# Patient Record
Sex: Female | Born: 1935 | Race: White | Hispanic: No | Marital: Married | Smoking: Never smoker
Health system: Southern US, Community
[De-identification: ages and names within clinical notes are randomized; demographics above are authoritative.]

## PROBLEM LIST (undated history)

## (undated) DIAGNOSIS — M1990 Unspecified osteoarthritis, unspecified site: Secondary | ICD-10-CM

## (undated) DIAGNOSIS — J45909 Unspecified asthma, uncomplicated: Secondary | ICD-10-CM

## (undated) DIAGNOSIS — D649 Anemia, unspecified: Secondary | ICD-10-CM

## (undated) DIAGNOSIS — M810 Age-related osteoporosis without current pathological fracture: Secondary | ICD-10-CM

## (undated) DIAGNOSIS — K219 Gastro-esophageal reflux disease without esophagitis: Secondary | ICD-10-CM

## (undated) HISTORY — DX: Unspecified osteoarthritis, unspecified site: M19.90

## (undated) HISTORY — PX: CHOLECYSTECTOMY: SHX55

## (undated) HISTORY — DX: Gastro-esophageal reflux disease without esophagitis: K21.9

## (undated) HISTORY — DX: Age-related osteoporosis without current pathological fracture: M81.0

## (undated) HISTORY — DX: Anemia, unspecified: D64.9

## (undated) HISTORY — PX: CATARACT EXTRACTION: SUR2

---

## 1962-12-04 HISTORY — PX: ABDOMINAL HYSTERECTOMY: SHX81

## 1993-12-04 HISTORY — PX: SHOULDER SURGERY: SHX246

## 1996-12-04 HISTORY — PX: BREAST BIOPSY: SHX20

## 2002-12-04 HISTORY — PX: KNEE SURGERY: SHX244

## 2006-01-24 ENCOUNTER — Ambulatory Visit: Payer: Self-pay | Admitting: Internal Medicine

## 2006-07-30 ENCOUNTER — Ambulatory Visit: Payer: Self-pay | Admitting: Ophthalmology

## 2007-04-25 ENCOUNTER — Ambulatory Visit: Payer: Self-pay | Admitting: Gastroenterology

## 2008-03-25 ENCOUNTER — Ambulatory Visit: Payer: Self-pay | Admitting: Internal Medicine

## 2008-05-04 ENCOUNTER — Ambulatory Visit: Payer: Self-pay | Admitting: Internal Medicine

## 2008-05-14 ENCOUNTER — Ambulatory Visit: Payer: Self-pay | Admitting: Internal Medicine

## 2008-06-03 ENCOUNTER — Ambulatory Visit: Payer: Self-pay | Admitting: Internal Medicine

## 2008-07-04 ENCOUNTER — Ambulatory Visit: Payer: Self-pay | Admitting: Internal Medicine

## 2008-08-20 ENCOUNTER — Ambulatory Visit: Payer: Self-pay | Admitting: Internal Medicine

## 2008-09-03 ENCOUNTER — Ambulatory Visit: Payer: Self-pay | Admitting: Internal Medicine

## 2008-10-04 ENCOUNTER — Ambulatory Visit: Payer: Self-pay | Admitting: Internal Medicine

## 2008-10-14 ENCOUNTER — Ambulatory Visit: Payer: Self-pay | Admitting: Internal Medicine

## 2008-11-03 ENCOUNTER — Ambulatory Visit: Payer: Self-pay | Admitting: Internal Medicine

## 2008-12-10 ENCOUNTER — Ambulatory Visit: Payer: Self-pay | Admitting: Internal Medicine

## 2009-01-04 ENCOUNTER — Ambulatory Visit: Payer: Self-pay | Admitting: Internal Medicine

## 2009-02-01 ENCOUNTER — Ambulatory Visit: Payer: Self-pay | Admitting: Internal Medicine

## 2009-02-04 ENCOUNTER — Ambulatory Visit: Payer: Self-pay | Admitting: Internal Medicine

## 2009-03-04 ENCOUNTER — Ambulatory Visit: Payer: Self-pay | Admitting: Internal Medicine

## 2009-03-25 ENCOUNTER — Ambulatory Visit: Payer: Self-pay | Admitting: Internal Medicine

## 2009-03-29 ENCOUNTER — Ambulatory Visit: Payer: Self-pay | Admitting: Internal Medicine

## 2009-04-03 ENCOUNTER — Ambulatory Visit: Payer: Self-pay | Admitting: Internal Medicine

## 2009-05-06 ENCOUNTER — Ambulatory Visit: Payer: Self-pay | Admitting: Gastroenterology

## 2009-05-27 ENCOUNTER — Ambulatory Visit: Payer: Self-pay | Admitting: Internal Medicine

## 2009-06-03 ENCOUNTER — Ambulatory Visit: Payer: Self-pay | Admitting: Internal Medicine

## 2009-07-04 ENCOUNTER — Ambulatory Visit: Payer: Self-pay | Admitting: Internal Medicine

## 2009-08-04 ENCOUNTER — Ambulatory Visit: Payer: Self-pay | Admitting: Internal Medicine

## 2009-09-03 ENCOUNTER — Ambulatory Visit: Payer: Self-pay | Admitting: Internal Medicine

## 2009-10-04 ENCOUNTER — Ambulatory Visit: Payer: Self-pay | Admitting: Internal Medicine

## 2009-11-03 ENCOUNTER — Ambulatory Visit: Payer: Self-pay | Admitting: Internal Medicine

## 2009-12-04 ENCOUNTER — Ambulatory Visit: Payer: Self-pay | Admitting: Internal Medicine

## 2010-01-04 ENCOUNTER — Ambulatory Visit: Payer: Self-pay | Admitting: Internal Medicine

## 2010-02-01 ENCOUNTER — Ambulatory Visit: Payer: Self-pay | Admitting: Internal Medicine

## 2010-03-04 ENCOUNTER — Ambulatory Visit: Payer: Self-pay | Admitting: Internal Medicine

## 2010-03-31 ENCOUNTER — Ambulatory Visit: Payer: Self-pay | Admitting: Internal Medicine

## 2010-05-26 ENCOUNTER — Ambulatory Visit: Payer: Self-pay | Admitting: Internal Medicine

## 2010-06-03 ENCOUNTER — Ambulatory Visit: Payer: Self-pay | Admitting: Internal Medicine

## 2010-06-21 ENCOUNTER — Ambulatory Visit: Payer: Self-pay | Admitting: Internal Medicine

## 2010-08-04 ENCOUNTER — Ambulatory Visit: Payer: Self-pay | Admitting: Internal Medicine

## 2010-08-18 ENCOUNTER — Ambulatory Visit: Payer: Self-pay | Admitting: Internal Medicine

## 2010-09-03 ENCOUNTER — Ambulatory Visit: Payer: Self-pay | Admitting: Internal Medicine

## 2010-10-04 ENCOUNTER — Ambulatory Visit: Payer: Self-pay | Admitting: Internal Medicine

## 2010-11-03 ENCOUNTER — Ambulatory Visit: Payer: Self-pay | Admitting: Internal Medicine

## 2010-12-04 ENCOUNTER — Ambulatory Visit: Payer: Self-pay | Admitting: Internal Medicine

## 2010-12-04 HISTORY — PX: KNEE SURGERY: SHX244

## 2011-01-04 ENCOUNTER — Ambulatory Visit: Payer: Self-pay | Admitting: Internal Medicine

## 2011-02-02 ENCOUNTER — Ambulatory Visit: Payer: Self-pay | Admitting: Internal Medicine

## 2011-03-05 ENCOUNTER — Ambulatory Visit: Payer: Self-pay | Admitting: Internal Medicine

## 2011-04-04 ENCOUNTER — Ambulatory Visit: Payer: Self-pay | Admitting: Internal Medicine

## 2011-04-18 ENCOUNTER — Ambulatory Visit: Payer: Self-pay | Admitting: Internal Medicine

## 2011-05-05 ENCOUNTER — Ambulatory Visit: Payer: Self-pay | Admitting: Internal Medicine

## 2011-06-04 ENCOUNTER — Ambulatory Visit: Payer: Self-pay | Admitting: Internal Medicine

## 2011-07-27 ENCOUNTER — Ambulatory Visit: Payer: Self-pay | Admitting: Internal Medicine

## 2011-08-05 ENCOUNTER — Ambulatory Visit: Payer: Self-pay | Admitting: Internal Medicine

## 2011-08-28 ENCOUNTER — Ambulatory Visit: Payer: Self-pay | Admitting: General Practice

## 2011-09-04 ENCOUNTER — Ambulatory Visit: Payer: Self-pay | Admitting: Internal Medicine

## 2011-09-11 ENCOUNTER — Inpatient Hospital Stay: Payer: Self-pay | Admitting: General Practice

## 2011-09-15 ENCOUNTER — Encounter: Payer: Self-pay | Admitting: Internal Medicine

## 2011-10-05 ENCOUNTER — Encounter: Payer: Self-pay | Admitting: Internal Medicine

## 2011-11-04 ENCOUNTER — Encounter: Payer: Self-pay | Admitting: Internal Medicine

## 2011-11-16 ENCOUNTER — Ambulatory Visit: Payer: Self-pay | Admitting: Internal Medicine

## 2011-12-05 ENCOUNTER — Ambulatory Visit: Payer: Self-pay | Admitting: Internal Medicine

## 2011-12-05 ENCOUNTER — Encounter: Payer: Self-pay | Admitting: Internal Medicine

## 2012-01-05 ENCOUNTER — Ambulatory Visit: Payer: Self-pay | Admitting: Internal Medicine

## 2012-01-05 ENCOUNTER — Encounter: Payer: Self-pay | Admitting: Internal Medicine

## 2012-01-16 ENCOUNTER — Ambulatory Visit: Payer: Self-pay | Admitting: Ophthalmology

## 2012-01-16 LAB — HEMOGLOBIN: HGB: 13.1 g/dL (ref 12.0–16.0)

## 2012-01-29 ENCOUNTER — Ambulatory Visit: Payer: Self-pay | Admitting: Ophthalmology

## 2012-02-02 ENCOUNTER — Ambulatory Visit: Payer: Self-pay | Admitting: Internal Medicine

## 2012-02-22 LAB — CBC CANCER CENTER
Basophil #: 0 x10 3/mm (ref 0.0–0.1)
Eosinophil #: 0.3 x10 3/mm (ref 0.0–0.7)
Eosinophil %: 8.4 %
HCT: 39.7 % (ref 35.0–47.0)
HGB: 12.6 g/dL (ref 12.0–16.0)
Lymphocyte #: 1.4 x10 3/mm (ref 1.0–3.6)
Lymphocyte %: 35.1 %
MCH: 26.8 pg (ref 26.0–34.0)
MCHC: 31.7 g/dL — ABNORMAL LOW (ref 32.0–36.0)
MCV: 84.4 fL (ref 80–100)
Monocyte #: 0.3 x10 3/mm (ref 0.0–0.7)
Neutrophil #: 1.9 x10 3/mm (ref 1.4–6.5)
Neutrophil %: 47.3 %
RBC: 4.7 10*6/uL (ref 3.80–5.20)
WBC: 3.9 x10 3/mm (ref 3.6–11.0)

## 2012-02-22 LAB — IRON AND TIBC
Iron Bind.Cap.(Total): 302 ug/dL (ref 250–450)
Iron Saturation: 17 %
Iron: 52 ug/dL (ref 50–170)

## 2012-03-04 ENCOUNTER — Ambulatory Visit: Payer: Self-pay | Admitting: Internal Medicine

## 2012-04-18 ENCOUNTER — Ambulatory Visit: Payer: Self-pay | Admitting: Internal Medicine

## 2012-05-04 ENCOUNTER — Ambulatory Visit: Payer: Self-pay | Admitting: Internal Medicine

## 2012-05-06 ENCOUNTER — Ambulatory Visit: Payer: Self-pay | Admitting: Internal Medicine

## 2012-05-20 ENCOUNTER — Ambulatory Visit: Payer: Self-pay | Admitting: Internal Medicine

## 2012-06-13 ENCOUNTER — Ambulatory Visit: Payer: Self-pay | Admitting: Internal Medicine

## 2012-06-13 LAB — FERRITIN: Ferritin (ARMC): 21 ng/mL (ref 8–388)

## 2012-06-13 LAB — CANCER CENTER HEMOGLOBIN: HGB: 11.7 g/dL — ABNORMAL LOW (ref 12.0–16.0)

## 2012-07-04 ENCOUNTER — Ambulatory Visit: Payer: Self-pay | Admitting: Internal Medicine

## 2012-08-08 ENCOUNTER — Ambulatory Visit: Payer: Self-pay | Admitting: Internal Medicine

## 2012-09-03 ENCOUNTER — Ambulatory Visit: Payer: Self-pay | Admitting: Internal Medicine

## 2012-10-03 ENCOUNTER — Ambulatory Visit: Payer: Self-pay | Admitting: Internal Medicine

## 2012-10-03 LAB — IRON AND TIBC
Iron Bind.Cap.(Total): 360 ug/dL (ref 250–450)
Unbound Iron-Bind.Cap.: 324 ug/dL

## 2012-10-03 LAB — CBC CANCER CENTER
Basophil %: 0.9 %
Eosinophil #: 0.3 x10 3/mm (ref 0.0–0.7)
Eosinophil %: 6.6 %
HCT: 34.3 % — ABNORMAL LOW (ref 35.0–47.0)
Lymphocyte #: 1.3 x10 3/mm (ref 1.0–3.6)
MCH: 23.4 pg — ABNORMAL LOW (ref 26.0–34.0)
MCHC: 31.6 g/dL — ABNORMAL LOW (ref 32.0–36.0)
MCV: 74 fL — ABNORMAL LOW (ref 80–100)
Monocyte %: 9.2 %
Neutrophil #: 2.1 x10 3/mm (ref 1.4–6.5)
RBC: 4.63 10*6/uL (ref 3.80–5.20)
RDW: 18.4 % — ABNORMAL HIGH (ref 11.5–14.5)
WBC: 4 x10 3/mm (ref 3.6–11.0)

## 2012-10-03 LAB — FERRITIN: Ferritin (ARMC): 11 ng/mL (ref 8–388)

## 2012-10-04 ENCOUNTER — Ambulatory Visit: Payer: Self-pay | Admitting: Internal Medicine

## 2012-11-03 ENCOUNTER — Ambulatory Visit: Payer: Self-pay | Admitting: Internal Medicine

## 2012-12-04 ENCOUNTER — Ambulatory Visit: Payer: Self-pay | Admitting: Internal Medicine

## 2012-12-26 LAB — IRON AND TIBC
Iron Saturation: 6 %
Iron: 24 ug/dL — ABNORMAL LOW (ref 50–170)
Unbound Iron-Bind.Cap.: 348 ug/dL

## 2012-12-26 LAB — CANCER CENTER HEMOGLOBIN: HGB: 11 g/dL — ABNORMAL LOW (ref 12.0–16.0)

## 2013-01-04 ENCOUNTER — Ambulatory Visit: Payer: Self-pay | Admitting: Internal Medicine

## 2013-02-01 ENCOUNTER — Ambulatory Visit: Payer: Self-pay | Admitting: Internal Medicine

## 2013-03-20 ENCOUNTER — Ambulatory Visit: Payer: Self-pay | Admitting: Internal Medicine

## 2013-03-20 LAB — CBC CANCER CENTER
Basophil #: 0 x10 3/mm (ref 0.0–0.1)
Basophil %: 0.9 %
Eosinophil #: 0.3 x10 3/mm (ref 0.0–0.7)
Eosinophil %: 5.9 %
HCT: 39.5 % (ref 35.0–47.0)
HGB: 12.8 g/dL (ref 12.0–16.0)
Lymphocyte #: 1.7 x10 3/mm (ref 1.0–3.6)
Lymphocyte %: 36.3 %
MCHC: 32.4 g/dL (ref 32.0–36.0)
Neutrophil #: 2.3 x10 3/mm (ref 1.4–6.5)
Neutrophil %: 48.7 %
RDW: 20.1 % — ABNORMAL HIGH (ref 11.5–14.5)

## 2013-03-20 LAB — HEPATIC FUNCTION PANEL A (ARMC)
Albumin: 3.6 g/dL (ref 3.4–5.0)
Alkaline Phosphatase: 110 U/L (ref 50–136)
Bilirubin, Direct: 0.1 mg/dL (ref 0.00–0.20)
Bilirubin,Total: 0.5 mg/dL (ref 0.2–1.0)
SGOT(AST): 17 U/L (ref 15–37)
SGPT (ALT): 21 U/L (ref 12–78)

## 2013-03-20 LAB — IRON AND TIBC: Iron Saturation: 15 %

## 2013-03-20 LAB — CREATININE, SERUM: EGFR (African American): >60

## 2013-04-03 ENCOUNTER — Ambulatory Visit: Payer: Self-pay | Admitting: Internal Medicine

## 2013-04-21 ENCOUNTER — Ambulatory Visit: Payer: Self-pay | Admitting: Internal Medicine

## 2013-06-12 ENCOUNTER — Ambulatory Visit: Payer: Self-pay | Admitting: Internal Medicine

## 2013-06-12 LAB — FERRITIN: Ferritin (ARMC): 24 ng/mL (ref 8–388)

## 2013-07-04 ENCOUNTER — Ambulatory Visit: Payer: Self-pay | Admitting: Internal Medicine

## 2013-09-04 ENCOUNTER — Ambulatory Visit: Payer: Self-pay | Admitting: Internal Medicine

## 2013-09-04 LAB — IRON AND TIBC
Iron Saturation: 20 %
Iron: 66 ug/dL (ref 50–170)
Unbound Iron-Bind.Cap.: 259 ug/dL

## 2013-09-04 LAB — CANCER CENTER HEMOGLOBIN: HGB: 12.7 g/dL (ref 12.0–16.0)

## 2013-10-04 ENCOUNTER — Ambulatory Visit: Payer: Self-pay | Admitting: Internal Medicine

## 2013-11-26 ENCOUNTER — Ambulatory Visit: Payer: Self-pay | Admitting: Internal Medicine

## 2013-11-26 LAB — IRON AND TIBC
Iron: 66 ug/dL (ref 50–170)
Unbound Iron-Bind.Cap.: 250 ug/dL

## 2013-11-26 LAB — FERRITIN: Ferritin (ARMC): 20 ng/mL (ref 8–388)

## 2013-11-26 LAB — CANCER CENTER HEMOGLOBIN: HGB: 12.1 g/dL (ref 12.0–16.0)

## 2013-12-04 ENCOUNTER — Ambulatory Visit: Payer: Self-pay | Admitting: Internal Medicine

## 2014-02-18 ENCOUNTER — Ambulatory Visit: Payer: Self-pay | Admitting: Internal Medicine

## 2014-02-19 LAB — CBC CANCER CENTER
Basophil #: 0 x10 3/mm (ref 0.0–0.1)
Basophil %: 0.6 %
EOS PCT: 4.5 %
Eosinophil #: 0.2 x10 3/mm (ref 0.0–0.7)
HCT: 30.7 % — ABNORMAL LOW (ref 35.0–47.0)
HGB: 9.6 g/dL — AB (ref 12.0–16.0)
Lymphocyte #: 0.9 x10 3/mm — ABNORMAL LOW (ref 1.0–3.6)
Lymphocyte %: 25.4 %
MCH: 24.2 pg — ABNORMAL LOW (ref 26.0–34.0)
MCHC: 31.2 g/dL — AB (ref 32.0–36.0)
MCV: 78 fL — ABNORMAL LOW (ref 80–100)
Monocyte #: 0.3 x10 3/mm (ref 0.2–0.9)
Monocyte %: 9.5 %
NEUTROS ABS: 2.2 x10 3/mm (ref 1.4–6.5)
Neutrophil %: 60 %
PLATELETS: 190 x10 3/mm (ref 150–440)
RBC: 3.95 10*6/uL (ref 3.80–5.20)
RDW: 17.1 % — AB (ref 11.5–14.5)
WBC: 3.7 x10 3/mm (ref 3.6–11.0)

## 2014-02-19 LAB — IRON AND TIBC
IRON BIND. CAP.(TOTAL): 329 ug/dL (ref 250–450)
IRON SATURATION: 7 %
IRON: 23 ug/dL — AB (ref 50–170)
Unbound Iron-Bind.Cap.: 306 ug/dL

## 2014-02-19 LAB — CREATININE, SERUM
CREATININE: 0.98 mg/dL (ref 0.60–1.30)
EGFR (African American): >60
EGFR (Non-African Amer.): 54 — ABNORMAL LOW

## 2014-02-19 LAB — HEPATIC FUNCTION PANEL A (ARMC)
ALK PHOS: 95 U/L
ALT: 22 U/L (ref 12–78)
Albumin: 3.2 g/dL — ABNORMAL LOW (ref 3.4–5.0)
Bilirubin, Direct: 0.2 mg/dL (ref 0.00–0.20)
Bilirubin,Total: 0.4 mg/dL (ref 0.2–1.0)
SGOT(AST): 18 U/L (ref 15–37)
TOTAL PROTEIN: 6.5 g/dL (ref 6.4–8.2)

## 2014-02-19 LAB — FERRITIN: FERRITIN (ARMC): 14 ng/mL (ref 8–388)

## 2014-03-04 ENCOUNTER — Ambulatory Visit: Payer: Self-pay | Admitting: Internal Medicine

## 2014-04-03 ENCOUNTER — Ambulatory Visit: Payer: Self-pay | Admitting: Internal Medicine

## 2014-04-23 ENCOUNTER — Ambulatory Visit: Payer: Self-pay | Admitting: Internal Medicine

## 2014-05-11 ENCOUNTER — Ambulatory Visit: Payer: Self-pay

## 2014-05-25 ENCOUNTER — Ambulatory Visit: Payer: Self-pay | Admitting: Internal Medicine

## 2014-05-25 LAB — CBC CANCER CENTER
Basophil #: 0 x10 3/mm (ref 0.0–0.1)
Basophil %: 0.7 %
EOS ABS: 0.1 x10 3/mm (ref 0.0–0.7)
Eosinophil %: 3.1 %
HCT: 37.9 % (ref 35.0–47.0)
HGB: 12.3 g/dL (ref 12.0–16.0)
Lymphocyte #: 1.2 x10 3/mm (ref 1.0–3.6)
Lymphocyte %: 27.5 %
MCH: 26.1 pg (ref 26.0–34.0)
MCHC: 32.3 g/dL (ref 32.0–36.0)
MCV: 81 fL (ref 80–100)
MONOS PCT: 8.3 %
Monocyte #: 0.4 x10 3/mm (ref 0.2–0.9)
NEUTROS ABS: 2.7 x10 3/mm (ref 1.4–6.5)
Neutrophil %: 60.4 %
PLATELETS: 154 x10 3/mm (ref 150–440)
RBC: 4.7 10*6/uL (ref 3.80–5.20)
RDW: 20.1 % — ABNORMAL HIGH (ref 11.5–14.5)
WBC: 4.4 x10 3/mm (ref 3.6–11.0)

## 2014-05-25 LAB — IRON AND TIBC
IRON BIND. CAP.(TOTAL): 307 ug/dL (ref 250–450)
IRON SATURATION: 19 %
Iron: 59 ug/dL (ref 50–170)
Unbound Iron-Bind.Cap.: 248 ug/dL

## 2014-05-25 LAB — FERRITIN: FERRITIN (ARMC): 52 ng/mL (ref 8–388)

## 2014-06-03 ENCOUNTER — Ambulatory Visit: Payer: Self-pay | Admitting: Internal Medicine

## 2014-08-26 ENCOUNTER — Ambulatory Visit: Payer: Self-pay | Admitting: Internal Medicine

## 2014-08-26 LAB — IRON AND TIBC
IRON SATURATION: 21 %
Iron Bind.Cap.(Total): 317 ug/dL (ref 250–450)
Iron: 67 ug/dL (ref 50–170)
UNBOUND IRON-BIND. CAP.: 250 ug/dL

## 2014-08-26 LAB — CBC CANCER CENTER
BASOS ABS: 0 x10 3/mm (ref 0.0–0.1)
Basophil %: 0.7 %
Eosinophil #: 0.2 x10 3/mm (ref 0.0–0.7)
Eosinophil %: 4 %
HCT: 42.5 % (ref 35.0–47.0)
HGB: 13.4 g/dL (ref 12.0–16.0)
LYMPHS ABS: 1.3 x10 3/mm (ref 1.0–3.6)
LYMPHS PCT: 32.5 %
MCH: 26.7 pg (ref 26.0–34.0)
MCHC: 31.6 g/dL — AB (ref 32.0–36.0)
MCV: 85 fL (ref 80–100)
Monocyte #: 0.3 x10 3/mm (ref 0.2–0.9)
Monocyte %: 8.3 %
Neutrophil #: 2.2 x10 3/mm (ref 1.4–6.5)
Neutrophil %: 54.5 %
PLATELETS: 161 x10 3/mm (ref 150–440)
RBC: 5.03 10*6/uL (ref 3.80–5.20)
RDW: 16.2 % — ABNORMAL HIGH (ref 11.5–14.5)
WBC: 4.1 x10 3/mm (ref 3.6–11.0)

## 2014-08-26 LAB — FERRITIN: FERRITIN (ARMC): 28 ng/mL (ref 8–388)

## 2014-09-03 ENCOUNTER — Ambulatory Visit: Payer: Self-pay | Admitting: Internal Medicine

## 2014-09-24 DIAGNOSIS — R195 Other fecal abnormalities: Secondary | ICD-10-CM | POA: Insufficient documentation

## 2014-09-24 DIAGNOSIS — G8929 Other chronic pain: Secondary | ICD-10-CM | POA: Insufficient documentation

## 2014-10-12 ENCOUNTER — Ambulatory Visit: Payer: Self-pay | Admitting: Unknown Physician Specialty

## 2014-12-16 ENCOUNTER — Ambulatory Visit: Payer: Self-pay | Admitting: Family Medicine

## 2014-12-16 DIAGNOSIS — M1990 Unspecified osteoarthritis, unspecified site: Secondary | ICD-10-CM | POA: Diagnosis not present

## 2014-12-16 DIAGNOSIS — J441 Chronic obstructive pulmonary disease with (acute) exacerbation: Secondary | ICD-10-CM | POA: Diagnosis not present

## 2014-12-24 ENCOUNTER — Ambulatory Visit: Payer: Self-pay | Admitting: Internal Medicine

## 2014-12-24 DIAGNOSIS — Z79899 Other long term (current) drug therapy: Secondary | ICD-10-CM | POA: Diagnosis not present

## 2014-12-24 DIAGNOSIS — D509 Iron deficiency anemia, unspecified: Secondary | ICD-10-CM | POA: Diagnosis not present

## 2014-12-24 LAB — IRON AND TIBC
Iron Bind.Cap.(Total): 326 ug/dL (ref 250–450)
Iron Saturation: 8 %
Iron: 25 ug/dL — ABNORMAL LOW (ref 50–170)
Unbound Iron-Bind.Cap.: 301 ug/dL

## 2014-12-24 LAB — FERRITIN: Ferritin (ARMC): 21 ng/mL (ref 8–388)

## 2014-12-24 LAB — CBC CANCER CENTER
BASOS PCT: 0.4 %
Basophil #: 0 x10 3/mm (ref 0.0–0.1)
Eosinophil #: 0.1 x10 3/mm (ref 0.0–0.7)
Eosinophil %: 1.1 %
HCT: 29.7 % — ABNORMAL LOW (ref 35.0–47.0)
HGB: 9.2 g/dL — AB (ref 12.0–16.0)
Lymphocyte #: 1.8 x10 3/mm (ref 1.0–3.6)
Lymphocyte %: 21 %
MCH: 22.4 pg — ABNORMAL LOW (ref 26.0–34.0)
MCHC: 30.9 g/dL — AB (ref 32.0–36.0)
MCV: 73 fL — ABNORMAL LOW (ref 80–100)
Monocyte #: 0.6 x10 3/mm (ref 0.2–0.9)
Monocyte %: 6.8 %
NEUTROS PCT: 70.7 %
Neutrophil #: 6.2 x10 3/mm (ref 1.4–6.5)
Platelet: 421 x10 3/mm (ref 150–440)
RBC: 4.09 10*6/uL (ref 3.80–5.20)
RDW: 18.9 % — ABNORMAL HIGH (ref 11.5–14.5)
WBC: 8.7 x10 3/mm (ref 3.6–11.0)

## 2014-12-30 DIAGNOSIS — Z79899 Other long term (current) drug therapy: Secondary | ICD-10-CM | POA: Diagnosis not present

## 2014-12-30 DIAGNOSIS — D509 Iron deficiency anemia, unspecified: Secondary | ICD-10-CM | POA: Diagnosis not present

## 2015-01-04 ENCOUNTER — Ambulatory Visit: Payer: Self-pay | Admitting: Internal Medicine

## 2015-01-13 DIAGNOSIS — D509 Iron deficiency anemia, unspecified: Secondary | ICD-10-CM | POA: Diagnosis not present

## 2015-01-15 DIAGNOSIS — R0602 Shortness of breath: Secondary | ICD-10-CM | POA: Diagnosis not present

## 2015-01-15 DIAGNOSIS — R05 Cough: Secondary | ICD-10-CM | POA: Diagnosis not present

## 2015-01-15 DIAGNOSIS — J449 Chronic obstructive pulmonary disease, unspecified: Secondary | ICD-10-CM | POA: Diagnosis not present

## 2015-01-15 DIAGNOSIS — Z8719 Personal history of other diseases of the digestive system: Secondary | ICD-10-CM | POA: Diagnosis not present

## 2015-01-25 DIAGNOSIS — J439 Emphysema, unspecified: Secondary | ICD-10-CM | POA: Diagnosis not present

## 2015-01-25 DIAGNOSIS — J449 Chronic obstructive pulmonary disease, unspecified: Secondary | ICD-10-CM | POA: Diagnosis not present

## 2015-01-25 DIAGNOSIS — E784 Other hyperlipidemia: Secondary | ICD-10-CM | POA: Diagnosis not present

## 2015-01-25 DIAGNOSIS — D500 Iron deficiency anemia secondary to blood loss (chronic): Secondary | ICD-10-CM | POA: Diagnosis not present

## 2015-02-02 ENCOUNTER — Ambulatory Visit
Admit: 2015-02-02 | Disposition: A | Discharge status: Home / Self Care | Payer: Self-pay | Attending: Internal Medicine | Admitting: Internal Medicine

## 2015-03-10 DIAGNOSIS — C44712 Basal cell carcinoma of skin of right lower limb, including hip: Secondary | ICD-10-CM | POA: Diagnosis not present

## 2015-03-17 DIAGNOSIS — C44722 Squamous cell carcinoma of skin of right lower limb, including hip: Secondary | ICD-10-CM | POA: Diagnosis not present

## 2015-03-24 DIAGNOSIS — C44722 Squamous cell carcinoma of skin of right lower limb, including hip: Secondary | ICD-10-CM | POA: Diagnosis not present

## 2015-03-28 NOTE — Op Note (Signed)
PATIENT NAME:  Daisy Holloway, CAPES MR#:  [id number] DATE OF BIRTH:  05/02/1929  DATE OF PROCEDURE:  01/29/2012  PREOPERATIVE DIAGNOSIS:  Cataract, right eye.   POSTOPERATIVE DIAGNOSIS:  Cataract, right eye.  PROCEDURE PERFORMED:  Extracapsular cataract extraction using phacoemulsification with placement of an Alcon [id number], 21.0-diopter posterior chamber lens, serial # [id number].  SURGEON:  Loura Back. Macgregor Aeschliman, MD  ASSISTANT:  None.  ANESTHESIA:  4% lidocaine and 0.75% Marcaine in a 50/50 mixture with 10 units per mL of Hylenex added, given as a peribulbar.  ANESTHESIOLOGIST:  Dr. Marcello Moores   COMPLICATIONS:  None.  ESTIMATED BLOOD LOSS:  Less than 1 mL.   DESCRIPTION OF PROCEDURE:  The patient was brought to the operating room and given a peribulbar block.  The patient was then prepped and draped in the usual fashion.  The vertical rectus muscles were imbricated using 5-0 silk sutures.  These sutures were then clamped to the sterile drapes as bridle sutures.  A limbal peritomy was performed extending two clock hours and hemostasis was obtained with cautery.  A partial thickness scleral groove was made at the surgical limbus and dissected anteriorly in a lamellar dissection using an Alcon crescent knife.  The anterior chamber was entered superonasally with a Superblade and through the lamellar dissection with a 2.6 mm keratome.  DisCoVisc was used to replace the aqueous and a continuous tear capsulorrhexis was carried out.  Hydrodissection and hydrodelineation were carried out with balanced salt and a 27 gauge canula.  The nucleus was rotated to confirm the effectiveness of the hydrodissection.  Phacoemulsification was carried out using a divide-and-conquer technique.  Total ultrasound time was 1 minute and 33.2 seconds with an average power of 19 percent, CDE 29.18.  Irrigation/aspiration was used to remove the residual cortex.  DisCoVisc was used to inflate the capsule and the internal  incision was enlarged to 3 mm with the crescent knife.  The intraocular lens was folded and inserted into the capsular bag using the Acrysert delivery system.  Irrigation/aspiration was used to remove the residual DisCoVisc.  Miostat was injected into the anterior chamber through the paracentesis track to inflate the anterior chamber and induce miosis.  The wound was checked for leaks and none were found. The conjunctiva was closed with cautery and the bridle sutures were removed.  Two drops of 0.3% Vigamox were placed on the eye.   An eye shield was placed on the eye.  The patient was discharged to the recovery room in good condition.  ____________________________ Loura Back Karia Ehresman, MD sad:drc D: 01/29/2012 12:53:34 ET T: 01/29/2012 13:08:34 ET JOB#: [id number]  cc: Remo Lipps A. Blue Ruggerio, MD, <Dictator> Martie Lee MD ELECTRONICALLY SIGNED 01/30/2012 15:09

## 2015-03-29 DIAGNOSIS — C44712 Basal cell carcinoma of skin of right lower limb, including hip: Secondary | ICD-10-CM | POA: Diagnosis not present

## 2015-04-01 ENCOUNTER — Other Ambulatory Visit: Payer: Self-pay | Admitting: *Deleted

## 2015-04-01 DIAGNOSIS — D509 Iron deficiency anemia, unspecified: Secondary | ICD-10-CM

## 2015-04-01 DIAGNOSIS — D500 Iron deficiency anemia secondary to blood loss (chronic): Secondary | ICD-10-CM

## 2015-04-01 HISTORY — DX: Iron deficiency anemia secondary to blood loss (chronic): D50.0

## 2015-04-07 ENCOUNTER — Other Ambulatory Visit: Payer: Self-pay | Admitting: *Deleted

## 2015-04-07 ENCOUNTER — Other Ambulatory Visit: Payer: Self-pay

## 2015-04-07 DIAGNOSIS — D509 Iron deficiency anemia, unspecified: Secondary | ICD-10-CM

## 2015-04-08 ENCOUNTER — Inpatient Hospital Stay: Payer: Medicare Other

## 2015-04-08 ENCOUNTER — Inpatient Hospital Stay: Payer: Medicare Other | Attending: Internal Medicine

## 2015-04-08 VITALS — BP 125/59 | HR 58 | Temp 97.3°F | Resp 20

## 2015-04-08 DIAGNOSIS — D509 Iron deficiency anemia, unspecified: Secondary | ICD-10-CM | POA: Insufficient documentation

## 2015-04-08 DIAGNOSIS — Z79899 Other long term (current) drug therapy: Secondary | ICD-10-CM | POA: Diagnosis not present

## 2015-04-08 LAB — CBC WITH DIFFERENTIAL/PLATELET
BASOS ABS: 0 10*3/uL (ref 0–0.1)
Basophils Relative: 0 %
EOS PCT: 4 %
Eosinophils Absolute: 0.2 10*3/uL (ref 0–0.7)
HCT: 37.3 % (ref 35.0–47.0)
HEMOGLOBIN: 12 g/dL (ref 12.0–16.0)
LYMPHS PCT: 29 %
Lymphs Abs: 1.2 10*3/uL (ref 1.0–3.6)
MCH: 25 pg — ABNORMAL LOW (ref 26.0–34.0)
MCHC: 32.2 g/dL (ref 32.0–36.0)
MCV: 77.7 fL — AB (ref 80.0–100.0)
MONO ABS: 0.4 10*3/uL (ref 0.2–0.9)
Monocytes Relative: 11 %
Neutro Abs: 2.3 10*3/uL (ref 1.4–6.5)
Neutrophils Relative %: 56 %
Platelets: 154 10*3/uL (ref 150–440)
RBC: 4.8 MIL/uL (ref 3.80–5.20)
RDW: 18.3 % — AB (ref 11.5–14.5)
WBC: 4.1 10*3/uL (ref 3.6–11.0)

## 2015-04-08 LAB — IRON AND TIBC
Iron: 49 ug/dL (ref 28–170)
Saturation Ratios: 15 % (ref 10.4–31.8)
TIBC: 336 ug/dL (ref 250–450)
UIBC: 287 ug/dL

## 2015-04-08 LAB — FERRITIN: Ferritin: 21 ng/mL (ref 11–307)

## 2015-04-08 MED ORDER — SODIUM CHLORIDE 0.9 % IV SOLN
Freq: Once | INTRAVENOUS | Status: AC
  Administered 2015-04-08: 10:00:00 via INTRAVENOUS
  Filled 2015-04-08: qty 250

## 2015-04-08 MED ORDER — IRON SUCROSE 20 MG/ML IV SOLN
100.0000 mg | Freq: Once | INTRAVENOUS | Status: AC
  Administered 2015-04-08: 100 mg via INTRAVENOUS
  Filled 2015-04-08: qty 5

## 2015-04-12 DIAGNOSIS — C44719 Basal cell carcinoma of skin of left lower limb, including hip: Secondary | ICD-10-CM | POA: Diagnosis not present

## 2015-04-12 DIAGNOSIS — C44712 Basal cell carcinoma of skin of right lower limb, including hip: Secondary | ICD-10-CM | POA: Diagnosis not present

## 2015-04-26 DIAGNOSIS — D500 Iron deficiency anemia secondary to blood loss (chronic): Secondary | ICD-10-CM | POA: Diagnosis not present

## 2015-04-26 DIAGNOSIS — E784 Other hyperlipidemia: Secondary | ICD-10-CM | POA: Diagnosis not present

## 2015-04-26 DIAGNOSIS — J209 Acute bronchitis, unspecified: Secondary | ICD-10-CM | POA: Diagnosis not present

## 2015-04-26 DIAGNOSIS — J439 Emphysema, unspecified: Secondary | ICD-10-CM | POA: Diagnosis not present

## 2015-05-03 ENCOUNTER — Ambulatory Visit: Admission: EM | Admit: 2015-05-03 | Discharge: 2015-05-03 | Discharge status: Absent without leave | Payer: Self-pay

## 2015-05-03 DIAGNOSIS — J441 Chronic obstructive pulmonary disease with (acute) exacerbation: Secondary | ICD-10-CM | POA: Diagnosis not present

## 2015-05-03 DIAGNOSIS — J189 Pneumonia, unspecified organism: Secondary | ICD-10-CM | POA: Diagnosis not present

## 2015-05-10 DIAGNOSIS — C44712 Basal cell carcinoma of skin of right lower limb, including hip: Secondary | ICD-10-CM | POA: Diagnosis not present

## 2015-05-10 DIAGNOSIS — L570 Actinic keratosis: Secondary | ICD-10-CM | POA: Diagnosis not present

## 2015-05-17 DIAGNOSIS — J449 Chronic obstructive pulmonary disease, unspecified: Secondary | ICD-10-CM | POA: Diagnosis not present

## 2015-05-17 DIAGNOSIS — J45909 Unspecified asthma, uncomplicated: Secondary | ICD-10-CM | POA: Diagnosis not present

## 2015-05-17 DIAGNOSIS — R05 Cough: Secondary | ICD-10-CM | POA: Diagnosis not present

## 2015-05-17 DIAGNOSIS — J851 Abscess of lung with pneumonia: Secondary | ICD-10-CM | POA: Diagnosis not present

## 2015-05-17 DIAGNOSIS — J189 Pneumonia, unspecified organism: Secondary | ICD-10-CM | POA: Diagnosis not present

## 2015-06-15 DIAGNOSIS — R5383 Other fatigue: Secondary | ICD-10-CM | POA: Diagnosis not present

## 2015-06-15 DIAGNOSIS — R05 Cough: Secondary | ICD-10-CM | POA: Diagnosis not present

## 2015-06-15 DIAGNOSIS — J449 Chronic obstructive pulmonary disease, unspecified: Secondary | ICD-10-CM | POA: Diagnosis not present

## 2015-06-15 DIAGNOSIS — G479 Sleep disorder, unspecified: Secondary | ICD-10-CM | POA: Diagnosis not present

## 2015-07-29 ENCOUNTER — Inpatient Hospital Stay: Payer: Medicare Other

## 2015-07-29 ENCOUNTER — Inpatient Hospital Stay: Payer: Medicare Other | Attending: Internal Medicine

## 2015-07-29 ENCOUNTER — Inpatient Hospital Stay (HOSPITAL_BASED_OUTPATIENT_CLINIC_OR_DEPARTMENT_OTHER): Payer: Medicare Other | Admitting: Internal Medicine

## 2015-07-29 VITALS — BP 130/60 | HR 68 | Temp 98.0°F | Resp 18 | Ht 63.98 in | Wt 165.3 lb

## 2015-07-29 DIAGNOSIS — Z8719 Personal history of other diseases of the digestive system: Secondary | ICD-10-CM | POA: Insufficient documentation

## 2015-07-29 DIAGNOSIS — K219 Gastro-esophageal reflux disease without esophagitis: Secondary | ICD-10-CM

## 2015-07-29 DIAGNOSIS — R531 Weakness: Secondary | ICD-10-CM | POA: Diagnosis not present

## 2015-07-29 DIAGNOSIS — D509 Iron deficiency anemia, unspecified: Secondary | ICD-10-CM | POA: Diagnosis not present

## 2015-07-29 DIAGNOSIS — J45909 Unspecified asthma, uncomplicated: Secondary | ICD-10-CM | POA: Insufficient documentation

## 2015-07-29 DIAGNOSIS — R0602 Shortness of breath: Secondary | ICD-10-CM | POA: Diagnosis not present

## 2015-07-29 DIAGNOSIS — M818 Other osteoporosis without current pathological fracture: Secondary | ICD-10-CM

## 2015-07-29 DIAGNOSIS — Z79899 Other long term (current) drug therapy: Secondary | ICD-10-CM | POA: Insufficient documentation

## 2015-07-29 DIAGNOSIS — M129 Arthropathy, unspecified: Secondary | ICD-10-CM

## 2015-07-29 LAB — CBC WITH DIFFERENTIAL/PLATELET
Basophils Absolute: 0 10*3/uL (ref 0–0.1)
Basophils Relative: 1 %
EOS PCT: 4 %
Eosinophils Absolute: 0.2 10*3/uL (ref 0–0.7)
HCT: 26.6 % — ABNORMAL LOW (ref 35.0–47.0)
Hemoglobin: 7.9 g/dL — ABNORMAL LOW (ref 12.0–16.0)
LYMPHS ABS: 1.2 10*3/uL (ref 1.0–3.6)
LYMPHS PCT: 27 %
MCH: 19.2 pg — AB (ref 26.0–34.0)
MCHC: 29.9 g/dL — ABNORMAL LOW (ref 32.0–36.0)
MCV: 64.2 fL — AB (ref 80.0–100.0)
Monocytes Absolute: 0.5 10*3/uL (ref 0.2–0.9)
Monocytes Relative: 11 %
Neutro Abs: 2.5 10*3/uL (ref 1.4–6.5)
Neutrophils Relative %: 57 %
PLATELETS: 205 10*3/uL (ref 150–440)
RBC: 4.14 MIL/uL (ref 3.80–5.20)
RDW: 20.3 % — AB (ref 11.5–14.5)
WBC: 4.4 10*3/uL (ref 3.6–11.0)

## 2015-07-29 LAB — IRON AND TIBC
Iron: 16 ug/dL — ABNORMAL LOW (ref 28–170)
Saturation Ratios: 4 % — ABNORMAL LOW (ref 10.4–31.8)
TIBC: 371 ug/dL (ref 250–450)
UIBC: 355 ug/dL

## 2015-07-29 LAB — FERRITIN: Ferritin: 8 ng/mL — ABNORMAL LOW (ref 11–307)

## 2015-07-29 NOTE — Progress Notes (Signed)
Patient is here for follow-up of IDA and Venofer treatment. Patient states that her energy has been down and she feels like it is time for an iron treatment.

## 2015-07-30 ENCOUNTER — Inpatient Hospital Stay: Payer: Medicare Other

## 2015-07-30 VITALS — BP 131/62 | HR 74 | Temp 98.6°F | Resp 20

## 2015-07-30 DIAGNOSIS — R531 Weakness: Secondary | ICD-10-CM | POA: Diagnosis not present

## 2015-07-30 DIAGNOSIS — Z79899 Other long term (current) drug therapy: Secondary | ICD-10-CM | POA: Diagnosis not present

## 2015-07-30 DIAGNOSIS — K219 Gastro-esophageal reflux disease without esophagitis: Secondary | ICD-10-CM | POA: Diagnosis not present

## 2015-07-30 DIAGNOSIS — Z8719 Personal history of other diseases of the digestive system: Secondary | ICD-10-CM | POA: Diagnosis not present

## 2015-07-30 DIAGNOSIS — M818 Other osteoporosis without current pathological fracture: Secondary | ICD-10-CM | POA: Diagnosis not present

## 2015-07-30 DIAGNOSIS — M129 Arthropathy, unspecified: Secondary | ICD-10-CM | POA: Diagnosis not present

## 2015-07-30 DIAGNOSIS — J45909 Unspecified asthma, uncomplicated: Secondary | ICD-10-CM | POA: Diagnosis not present

## 2015-07-30 DIAGNOSIS — D509 Iron deficiency anemia, unspecified: Secondary | ICD-10-CM

## 2015-07-30 DIAGNOSIS — R0602 Shortness of breath: Secondary | ICD-10-CM | POA: Diagnosis not present

## 2015-07-30 MED ORDER — SODIUM CHLORIDE 0.9 % IV SOLN
INTRAVENOUS | Status: DC
  Administered 2015-07-30: 09:00:00 via INTRAVENOUS
  Filled 2015-07-30: qty 1000

## 2015-07-30 MED ORDER — SODIUM CHLORIDE 0.9 % IV SOLN
500.0000 mg | Freq: Once | INTRAVENOUS | Status: AC
  Administered 2015-07-30: 500 mg via INTRAVENOUS
  Filled 2015-07-30: qty 25

## 2015-08-03 DIAGNOSIS — D509 Iron deficiency anemia, unspecified: Secondary | ICD-10-CM | POA: Diagnosis not present

## 2015-08-06 NOTE — Progress Notes (Signed)
[place]  Telephone:([phone] Fax:(336) [phone]     ID: Daisy Holloway OB: 05/02/1929  MR#: [id number]  NGE#:[id number]  No care team member to display  CHIEF COMPLAINT/DIAGNOSIS:  Iron deficiency anemia unresponsive to oral iron therapy, history of GI blood loss - patient on parenteral iron therapy with Venofer (iron sucrose).     HISTORY OF PRESENT ILLNESS:  Patient returns for continued hematology followup, she was last seen in September 2015. Labs in May 2016 showed hemoglobin doing fairly well at 12 g, iron study was in the normal range. Labs today however shows that she has developed significant anemia, hemoglobin has dropped down to 7.9. Patient states that she has been feeling progressive fatigability and dyspnea on exertion. She denies any angina, palpitations, dizziness, orthopnea or PND. She had EGD in November 2015 which reported gastritis. States that she wants to see Dr. Vira Agar for GI evaluation since she has seen him in the past also. Denies any new bone pains. Fairly physically active. Eating steady, denies unintentional weight loss.  REVIEW OF SYSTEMS:   ROS As in HPI above. In addition, no fever, chills or sweats. No new headaches or focal weakness.  No new sore throat, cough, sputum, hemoptysis or chest pain. No new abdominal pain, constipation, diarrhea, dysuria or hematuria. No new skin rash or bleeding symptoms. No new paresthesias in extremities. No polyuria polydipsia.  PAST MEDICAL HISTORY: Reviewed. Chronic arthritis and osteoporosis History of asthma Iron deficiency anemia Gastroesophageal reflux disease. EGD November 2015 reported gastritis.  PAST SURGICAL HISTORY: Reviewed. Cholecystectomy, hysterectomy in 1964, breast surgery '99 benign, cataract surgery 2007, shoulder surgery 1995, knee surgery 2004  FAMILY HISTORY: Reviewed. Noncontributory, denies hematological disorders. Remarkable for arthritis, asthma, cancer and  diabetes.  SOCIAL HISTORY: Reviewed. No history of smoking or alcohol use.  Allergies  Allergen Reactions  . Aspirin     Other reaction(s): Distress (finding)  . Sulfa Antibiotics     Other reaction(s): Unknown    Current Outpatient Prescriptions  Medication Sig Dispense Refill  . Albuterol Sulfate (PROAIR HFA IN) Inhale into the lungs 4 (four) times daily as needed. 2 puffs    . atorvastatin (LIPITOR) 10 MG tablet Take by mouth.    . Cholecalciferol (VITAMIN D3) 2000 UNITS capsule Take by mouth.    . Cyanocobalamin (RA VITAMIN B-12 TR) 1000 MCG TBCR Take by mouth.    . DHA-EPA-VITAMIN E PO Take by mouth.    . fluticasone (FLONASE) 50 MCG/ACT nasal spray Place into the nose.    Marland Kitchen Fluticasone-Salmeterol (ADVAIR) 250-50 MCG/DOSE AEPB Inhale 1 puff into the lungs 2 (two) times daily.    Marland Kitchen loratadine (CLARITIN) 10 MG tablet Take 10 mg by mouth daily.    . Multiple Vitamin (MULTI-VITAMINS) TABS Take by mouth.    Marland Kitchen omeprazole (PRILOSEC) 20 MG capsule Take 20 mg by mouth daily as needed.    . sodium chloride (TGT SALINE NASAL SPRAY) 0.65 % nasal spray Place into the nose.    . sucralfate (CARAFATE) 1 G tablet Take 1 g by mouth 4 (four) times daily.    . Glycerin-Hypromellose-PEG 400 ([facility] DRY EYE RELIEF) 0.2-0.2-1 % SOLN Apply to eye.     No current facility-administered medications for this visit.    PHYSICAL EXAM: Filed Vitals:   07/29/15 0920  BP: 130/60  Pulse: 68  Temp: 98 F (36.7 C)  Resp: 18     Body mass index is 28.4 kg/(m^2).      GENERAL: Patient  is alert and oriented and in no acute distress. There is no icterus. Pallor 2+. HEENT: EOMs intact. Oral exam negative for thrush. No cervical lymphadenopathy. CVS: S1S2, regular LUNGS: Bilaterally clear to auscultation, no rhonchi. ABDOMEN: Soft, nontender. No hepatosplenomegaly clinically.  NEURO: grossly nonfocal, cranial nerves are intact.   EXTREMITIES: No pedal edema.   LAB RESULTS: Hemoglobin 7.9, MCV 64.2, WBC  4.4, 57% neutrophils, serum iron low at 16, ferritin low at 8, iron saturation low at 4%, TIBC 371. Lab Results  Component Value Date   WBC 4.4 07/29/2015   NEUTROABS 2.5 07/29/2015   HGB 7.9* 07/29/2015   HCT 26.6* 07/29/2015   MCV 64.2* 07/29/2015   PLT 205 07/29/2015   Lab Results  Component Value Date   IRON 16* 07/29/2015     ASSESSMENT / PLAN:   1. Anemia of iron deficiency and history of GI blood loss  -  have reviewed labs from today and discussed with patient in detail. I have explained that she has developed recurrent significant iron deficiency anemia, this has happened fairly quickly since hemoglobin was normal in May along with iron study. Given this, obvious concern is if she has ongoing occult GI blood loss and will request evaluation by Dr. Vira Agar as soon as possible. Regarding iron deficiency, plan is to pursue a course of parenteral iron therapy with IV Venofer 500 mg 2 doses, these will be scheduled on August 26 and September 8. We will also get repeat hemoglobin on September 8 and see him back for close follow-up to make sure that anemia is not quickly worsening.   2. In between visits, the patient has been advised to call or come to the ER in case of progressive anemia symptoms or acute sickness. She is agreeable to this plan.      Leia Alf, MD   08/06/2015 10:49 AM

## 2015-08-10 ENCOUNTER — Other Ambulatory Visit: Payer: Self-pay | Admitting: Physician Assistant

## 2015-08-10 DIAGNOSIS — Z01812 Encounter for preprocedural laboratory examination: Secondary | ICD-10-CM | POA: Diagnosis not present

## 2015-08-10 DIAGNOSIS — D508 Other iron deficiency anemias: Secondary | ICD-10-CM | POA: Diagnosis not present

## 2015-08-10 DIAGNOSIS — R1031 Right lower quadrant pain: Secondary | ICD-10-CM | POA: Diagnosis not present

## 2015-08-10 DIAGNOSIS — L298 Other pruritus: Secondary | ICD-10-CM | POA: Diagnosis not present

## 2015-08-12 ENCOUNTER — Inpatient Hospital Stay: Payer: Medicare Other

## 2015-08-12 ENCOUNTER — Inpatient Hospital Stay (HOSPITAL_BASED_OUTPATIENT_CLINIC_OR_DEPARTMENT_OTHER): Payer: Medicare Other | Admitting: Internal Medicine

## 2015-08-12 ENCOUNTER — Inpatient Hospital Stay: Payer: Medicare Other | Attending: Internal Medicine

## 2015-08-12 VITALS — BP 130/58 | HR 67 | Temp 98.0°F | Resp 20

## 2015-08-12 VITALS — Ht 63.98 in | Wt 165.1 lb

## 2015-08-12 DIAGNOSIS — K219 Gastro-esophageal reflux disease without esophagitis: Secondary | ICD-10-CM | POA: Diagnosis not present

## 2015-08-12 DIAGNOSIS — Z79899 Other long term (current) drug therapy: Secondary | ICD-10-CM

## 2015-08-12 DIAGNOSIS — D509 Iron deficiency anemia, unspecified: Secondary | ICD-10-CM | POA: Diagnosis not present

## 2015-08-12 DIAGNOSIS — M129 Arthropathy, unspecified: Secondary | ICD-10-CM | POA: Insufficient documentation

## 2015-08-12 DIAGNOSIS — M818 Other osteoporosis without current pathological fracture: Secondary | ICD-10-CM

## 2015-08-12 DIAGNOSIS — L03115 Cellulitis of right lower limb: Secondary | ICD-10-CM | POA: Diagnosis not present

## 2015-08-12 LAB — CBC WITH DIFFERENTIAL/PLATELET
BASOS ABS: 0 10*3/uL (ref 0–0.1)
EOS ABS: 0.1 10*3/uL (ref 0–0.7)
HCT: 32 % — ABNORMAL LOW (ref 35.0–47.0)
HEMOGLOBIN: 9.5 g/dL — AB (ref 12.0–16.0)
Lymphocytes Relative: 29 %
Lymphs Abs: 1 10*3/uL (ref 1.0–3.6)
MCH: 20.3 pg — ABNORMAL LOW (ref 26.0–34.0)
MCHC: 29.8 g/dL — ABNORMAL LOW (ref 32.0–36.0)
MCV: 68 fL — ABNORMAL LOW (ref 80.0–100.0)
Monocytes Absolute: 0.4 10*3/uL (ref 0.2–0.9)
Monocytes Relative: 11 %
Neutro Abs: 1.9 10*3/uL (ref 1.4–6.5)
PLATELETS: 165 10*3/uL (ref 150–440)
RBC: 4.7 MIL/uL (ref 3.80–5.20)
RDW: 25.2 % — ABNORMAL HIGH (ref 11.5–14.5)
WBC: 3.4 10*3/uL — AB (ref 3.6–11.0)

## 2015-08-12 LAB — CREATININE, SERUM: CREATININE: 0.79 mg/dL (ref 0.44–1.00)

## 2015-08-12 LAB — SAMPLE TO BLOOD BANK

## 2015-08-12 MED ORDER — SODIUM CHLORIDE 0.9 % IV SOLN
500.0000 mg | Freq: Once | INTRAVENOUS | Status: AC
  Administered 2015-08-12: 500 mg via INTRAVENOUS
  Filled 2015-08-12: qty 25

## 2015-08-12 NOTE — Progress Notes (Signed)
Pt here for IV venofer. MD gave order to run her venofer over 3.5 hours to RN Sofie Rower and Myra the pharmacist.

## 2015-08-13 ENCOUNTER — Other Ambulatory Visit: Payer: Self-pay | Admitting: Physician Assistant

## 2015-08-13 DIAGNOSIS — D508 Other iron deficiency anemias: Secondary | ICD-10-CM

## 2015-08-16 ENCOUNTER — Ambulatory Visit: Payer: Medicare Other

## 2015-08-16 ENCOUNTER — Ambulatory Visit
Admission: RE | Admit: 2015-08-16 | Discharge: 2015-08-16 | Disposition: A | Discharge status: Home / Self Care | Payer: Medicare Other | Source: Ambulatory Visit | Attending: Physician Assistant | Admitting: Physician Assistant

## 2015-08-16 DIAGNOSIS — K449 Diaphragmatic hernia without obstruction or gangrene: Secondary | ICD-10-CM | POA: Diagnosis not present

## 2015-08-16 DIAGNOSIS — N8110 Cystocele, unspecified: Secondary | ICD-10-CM | POA: Insufficient documentation

## 2015-08-16 DIAGNOSIS — I2510 Atherosclerotic heart disease of native coronary artery without angina pectoris: Secondary | ICD-10-CM | POA: Insufficient documentation

## 2015-08-16 DIAGNOSIS — D649 Anemia, unspecified: Secondary | ICD-10-CM | POA: Diagnosis not present

## 2015-08-16 DIAGNOSIS — D508 Other iron deficiency anemias: Secondary | ICD-10-CM | POA: Diagnosis not present

## 2015-08-16 HISTORY — DX: Unspecified asthma, uncomplicated: J45.909

## 2015-08-16 MED ORDER — IOHEXOL 300 MG/ML  SOLN
100.0000 mL | Freq: Once | INTRAMUSCULAR | Status: AC | PRN
  Administered 2015-08-16: 100 mL via INTRAVENOUS

## 2015-08-16 MED ORDER — BARIUM SULFATE 2.1 % PO SUSP
450.0000 mL | ORAL | Status: AC
  Administered 2015-08-16 (×2): 450 mL via ORAL

## 2015-08-25 DIAGNOSIS — L905 Scar conditions and fibrosis of skin: Secondary | ICD-10-CM | POA: Diagnosis not present

## 2015-08-26 NOTE — Progress Notes (Signed)
[place]  Telephone:(336) [phone] Fax:(336) [phone]     ID: Sheriece Jefcoat Houseman OB: 05/02/1929  MR#: [id number]  WLS#:[id number]  Patient Care Team: Cletis Athens, MD as PCP - General (Internal Medicine)  CHIEF COMPLAINT/DIAGNOSIS:  Iron deficiency anemia unresponsive to oral iron therapy, history of GI blood loss - patient on parenteral iron therapy with Venofer (iron sucrose).     HISTORY OF PRESENT ILLNESS:  Patient returns for continued hematology followup, labs done shows Hb better today. Clinically feels less fatigued and dyspnea on exertion. She denies any angina, palpitations, dizziness, orthopnea or PND. She had EGD in November 2015 which reported gastritis. States she is seeing GI Dr. Vira Agar.  REVIEW OF SYSTEMS:   ROS As in HPI above. In addition, no fever, chills. No new headaches or focal weakness.  No new abdominal pain, constipation, diarrhea, dysuria or hematuria. No new paresthesias in extremities.  PAST MEDICAL HISTORY: Reviewed. Chronic arthritis and osteoporosis History of asthma Iron deficiency anemia Gastroesophageal reflux disease. EGD November 2015 reported gastritis.  PAST SURGICAL HISTORY: Reviewed. Cholecystectomy, hysterectomy in 1964, breast surgery '99 benign, cataract surgery 2007, shoulder surgery 1995, knee surgery 2004  FAMILY HISTORY: Reviewed. Noncontributory, denies hematological disorders. Remarkable for arthritis, asthma, cancer and diabetes.  SOCIAL HISTORY: Reviewed. No history of smoking or alcohol use.  Allergies  Allergen Reactions  . Aspirin     Other reaction(s): Distress (finding)  . Sulfa Antibiotics     Other reaction(s): Unknown    Current Outpatient Prescriptions  Medication Sig Dispense Refill  . Albuterol Sulfate (PROAIR HFA IN) Inhale into the lungs 4 (four) times daily as needed. 2 puffs    . atorvastatin (LIPITOR) 10 MG tablet Take by mouth.    . Cholecalciferol (VITAMIN D3) 2000 UNITS capsule Take by  mouth.    . Cyanocobalamin (RA VITAMIN B-12 TR) 1000 MCG TBCR Take by mouth.    . DHA-EPA-VITAMIN E PO Take by mouth.    . fluticasone (FLONASE) 50 MCG/ACT nasal spray Place into the nose.    Marland Kitchen Fluticasone-Salmeterol (ADVAIR) 250-50 MCG/DOSE AEPB Inhale 1 puff into the lungs 2 (two) times daily.    . Glycerin-Hypromellose-PEG 400 ([employer] DRY EYE RELIEF) 0.2-0.2-1 % SOLN Apply to eye.    . loratadine (CLARITIN) 10 MG tablet Take 10 mg by mouth daily.    . Multiple Vitamin (MULTI-VITAMINS) TABS Take by mouth.    Marland Kitchen omeprazole (PRILOSEC) 20 MG capsule Take 20 mg by mouth daily as needed.    . sodium chloride (TGT SALINE NASAL SPRAY) 0.65 % nasal spray Place into the nose.    . sucralfate (CARAFATE) 1 G tablet Take 1 g by mouth 4 (four) times daily.     No current facility-administered medications for this visit.    PHYSICAL EXAM: GENERAL: Alert and oriented and in no acute distress. There is no icterus. Mild pallor. LUNGS: Bilaterally clear to auscultation, no rhonchi. ABDOMEN: Soft, nontender.     EXTREMITIES: No pedal edema.   LAB RESULTS: Hemoglobin 7.9, MCV 64.2, WBC 4.4, 57% neutrophils, serum iron low at 16, ferritin low at 8, iron saturation low at 4%, TIBC 371. Lab Results  Component Value Date   WBC 3.4* 08/12/2015   NEUTROABS 1.9 08/12/2015   HGB 9.5* 08/12/2015   HCT 32.0* 08/12/2015   MCV 68.0* 08/12/2015   PLT 165 08/12/2015   Lab Results  Component Value Date   IRON 16* 07/29/2015     ASSESSMENT / PLAN:   1. Anemia of  iron deficiency and history of GI blood loss  -  have reviewed labs from today and d/w patient. Hb better at 9.5 from 7.9. She is getting second iron Rx today. She is following with GI Dr. Vira Agar. Monitor closely, Hb at 4 weeks, next MD f/u at 8 weeks with CBC/iron study and make further treatment planning.    2. In between visits, the patient has been advised to call or come to the ER in case of progressive anemia symptoms or acute sickness. She is  agreeable to this plan.      Leia Alf, MD   08/26/2015 10:57 AM

## 2015-08-30 DIAGNOSIS — D508 Other iron deficiency anemias: Secondary | ICD-10-CM | POA: Diagnosis not present

## 2015-08-30 DIAGNOSIS — K219 Gastro-esophageal reflux disease without esophagitis: Secondary | ICD-10-CM | POA: Diagnosis not present

## 2015-08-30 DIAGNOSIS — Z8719 Personal history of other diseases of the digestive system: Secondary | ICD-10-CM | POA: Diagnosis not present

## 2015-09-09 ENCOUNTER — Inpatient Hospital Stay: Payer: Medicare Other | Attending: Internal Medicine

## 2015-09-09 DIAGNOSIS — D509 Iron deficiency anemia, unspecified: Secondary | ICD-10-CM | POA: Diagnosis not present

## 2015-09-09 LAB — CBC WITH DIFFERENTIAL/PLATELET
Basophils Absolute: 0 10*3/uL (ref 0–0.1)
Basophils Relative: 0 %
EOS ABS: 0.2 10*3/uL (ref 0–0.7)
Eosinophils Relative: 4 %
HEMATOCRIT: 39.1 % (ref 35.0–47.0)
HEMOGLOBIN: 12.1 g/dL (ref 12.0–16.0)
LYMPHS ABS: 1.3 10*3/uL (ref 1.0–3.6)
Lymphocytes Relative: 33 %
MCH: 22.5 pg — AB (ref 26.0–34.0)
MCHC: 30.9 g/dL — AB (ref 32.0–36.0)
MCV: 72.6 fL — ABNORMAL LOW (ref 80.0–100.0)
MONO ABS: 0.3 10*3/uL (ref 0.2–0.9)
MONOS PCT: 9 %
NEUTROS PCT: 54 %
Neutro Abs: 2.1 10*3/uL (ref 1.4–6.5)
Platelets: 128 10*3/uL — ABNORMAL LOW (ref 150–440)
RBC: 5.38 MIL/uL — ABNORMAL HIGH (ref 3.80–5.20)
RDW: 29.8 % — ABNORMAL HIGH (ref 11.5–14.5)
WBC: 3.9 10*3/uL (ref 3.6–11.0)

## 2015-09-09 LAB — FERRITIN: FERRITIN: 64 ng/mL (ref 11–307)

## 2015-09-09 LAB — IRON AND TIBC
Iron: 62 ug/dL (ref 28–170)
SATURATION RATIOS: 20 % (ref 10.4–31.8)
TIBC: 315 ug/dL (ref 250–450)
UIBC: 253 ug/dL

## 2015-09-09 LAB — SAMPLE TO BLOOD BANK

## 2015-10-20 ENCOUNTER — Telehealth: Payer: Self-pay | Admitting: *Deleted

## 2015-10-20 ENCOUNTER — Other Ambulatory Visit: Payer: Self-pay | Admitting: *Deleted

## 2015-10-20 DIAGNOSIS — D509 Iron deficiency anemia, unspecified: Secondary | ICD-10-CM

## 2015-10-20 NOTE — Telephone Encounter (Signed)
Called pt to ask her to come earlier tom for her appt. She is agreeable for 9:45 for labs and see md 10 am.  I will notify Mickel Baas when I get to [facility] in am

## 2015-10-21 ENCOUNTER — Inpatient Hospital Stay: Payer: Medicare Other | Attending: Internal Medicine

## 2015-10-21 ENCOUNTER — Inpatient Hospital Stay (HOSPITAL_BASED_OUTPATIENT_CLINIC_OR_DEPARTMENT_OTHER): Payer: Medicare Other | Admitting: Internal Medicine

## 2015-10-21 VITALS — BP 128/70 | HR 62 | Temp 98.4°F | Resp 18 | Ht 63.98 in | Wt 164.2 lb

## 2015-10-21 DIAGNOSIS — M129 Arthropathy, unspecified: Secondary | ICD-10-CM | POA: Insufficient documentation

## 2015-10-21 DIAGNOSIS — M818 Other osteoporosis without current pathological fracture: Secondary | ICD-10-CM

## 2015-10-21 DIAGNOSIS — Z8709 Personal history of other diseases of the respiratory system: Secondary | ICD-10-CM | POA: Diagnosis not present

## 2015-10-21 DIAGNOSIS — K219 Gastro-esophageal reflux disease without esophagitis: Secondary | ICD-10-CM | POA: Insufficient documentation

## 2015-10-21 DIAGNOSIS — D509 Iron deficiency anemia, unspecified: Secondary | ICD-10-CM | POA: Diagnosis not present

## 2015-10-21 DIAGNOSIS — R42 Dizziness and giddiness: Secondary | ICD-10-CM | POA: Insufficient documentation

## 2015-10-21 DIAGNOSIS — Z8719 Personal history of other diseases of the digestive system: Secondary | ICD-10-CM

## 2015-10-21 DIAGNOSIS — Z79899 Other long term (current) drug therapy: Secondary | ICD-10-CM | POA: Insufficient documentation

## 2015-10-21 LAB — CBC WITH DIFFERENTIAL/PLATELET
Basophils Absolute: 0 10*3/uL (ref 0–0.1)
Basophils Relative: 1 %
EOS ABS: 0.2 10*3/uL (ref 0–0.7)
EOS PCT: 5 %
HCT: 37.7 % (ref 35.0–47.0)
Hemoglobin: 12.2 g/dL (ref 12.0–16.0)
LYMPHS ABS: 1.2 10*3/uL (ref 1.0–3.6)
LYMPHS PCT: 30 %
MCH: 24.5 pg — AB (ref 26.0–34.0)
MCHC: 32.3 g/dL (ref 32.0–36.0)
MCV: 75.8 fL — AB (ref 80.0–100.0)
MONO ABS: 0.3 10*3/uL (ref 0.2–0.9)
MONOS PCT: 8 %
Neutro Abs: 2.2 10*3/uL (ref 1.4–6.5)
Neutrophils Relative %: 56 %
PLATELETS: 142 10*3/uL — AB (ref 150–440)
RBC: 4.97 MIL/uL (ref 3.80–5.20)
RDW: 24.1 % — ABNORMAL HIGH (ref 11.5–14.5)
WBC: 3.8 10*3/uL (ref 3.6–11.0)

## 2015-10-21 LAB — IRON AND TIBC
IRON: 42 ug/dL (ref 28–170)
Saturation Ratios: 13 % (ref 10.4–31.8)
TIBC: 331 ug/dL (ref 250–450)
UIBC: 289 ug/dL

## 2015-10-21 LAB — SAMPLE TO BLOOD BANK

## 2015-10-21 LAB — FERRITIN: Ferritin: 22 ng/mL (ref 11–307)

## 2015-10-21 NOTE — Progress Notes (Signed)
Pt feeling much better with iron infusion, energy better, no blood in stool or urine.  She is not eating snacks anymore to help loose wt.

## 2015-10-21 NOTE — Progress Notes (Signed)
[place]  Telephone:(336) [phone] Fax:(336) [phone]     ID: Junella Domke Dileonardo OB: 05/02/1929  MR#: [id number]  OBS#:[id number]  Patient Care Team: Cletis Athens, MD as PCP - General (Internal Medicine)  CHIEF COMPLAINT/DIAGNOSIS:  Iron deficiency anemia unresponsive to oral iron therapy, history of GI blood loss - patient on parenteral iron therapy with Venofer (iron sucrose).     HISTORY OF PRESENT ILLNESS:  Patient returns for follow-up visit today. She has done well since her previous appointment area to she feels much less short of breath or fatigue. Her GERD symptoms appear to be better controlled with Protonix and Carafate as recommended by Dr. Vira Agar, who she saw in September 2016. She denies any nausea, vomiting, diarrhea, constipation, change in color of stool, bleeding from any other source. She denies shortness of breath, chest pain., Dizziness with C  REVIEW OF SYSTEMS:   ROS As in HPI above. In addition, no fever, chills. No new headaches or focal weakness.  No new abdominal pain, constipation, diarrhea, dysuria or hematuria. No new paresthesias in extremities.  PAST MEDICAL HISTORY: Reviewed. Chronic arthritis and osteoporosis History of asthma Iron deficiency anemia Gastroesophageal reflux disease. EGD November 2015 reported gastritis.  PAST SURGICAL HISTORY: Reviewed. Cholecystectomy, hysterectomy in 1964, breast surgery '99 benign, cataract surgery 2007, shoulder surgery 1995, knee surgery 2004  FAMILY HISTORY: Reviewed. Noncontributory, denies hematological disorders. Remarkable for arthritis, asthma, cancer and diabetes.  SOCIAL HISTORY: Reviewed. No history of smoking or alcohol use.  Allergies  Allergen Reactions  . Aspirin Nausea Only    Other reaction(s): Distress (finding) Other reaction(s): Distress (finding)  . Sulfa Antibiotics Hives    Other reaction(s): Unknown Other reaction(s): Unknown    Current Outpatient Prescriptions    Medication Sig Dispense Refill  . Albuterol Sulfate (PROAIR HFA IN) Inhale into the lungs 4 (four) times daily as needed. 2 puffs    . atorvastatin (LIPITOR) 10 MG tablet Take by mouth.    . Cholecalciferol (VITAMIN D3) 2000 UNITS capsule Take by mouth.    . Cyanocobalamin (RA VITAMIN B-12 TR) 1000 MCG TBCR Take by mouth.    . fluticasone (FLONASE) 50 MCG/ACT nasal spray Place into the nose.    Marland Kitchen Fluticasone-Salmeterol (ADVAIR) 250-50 MCG/DOSE AEPB Inhale 1 puff into the lungs 2 (two) times daily.    . Glycerin-Hypromellose-PEG 400 ([employer] DRY EYE RELIEF) 0.2-0.2-1 % SOLN Apply to eye.    . loratadine (CLARITIN) 10 MG tablet Take 10 mg by mouth daily.    . Multiple Vitamin (MULTI-VITAMINS) TABS Take by mouth.    Marland Kitchen omeprazole (PRILOSEC) 20 MG capsule Take 20 mg by mouth daily as needed.    . sucralfate (CARAFATE) 1 G tablet Take 1 g by mouth 4 (four) times daily.     No current facility-administered medications for this visit.    PHYSICAL EXAM: GENERAL: Alert and oriented and in no acute distress. There is no icterus or pallor. LUNGS: Bilaterally clear to auscultation, no rhonchi. ABDOMEN: Soft, nontender. No organomegaly   EXTREMITIES: No pedal edema.   LAB RESULTS: Recent Results (from the past 2160 hour(s))  CBC with Differential/Platelet     Status: Abnormal   Collection Time: 07/29/15  9:07 AM  Result Value Ref Range   WBC 4.4 3.6 - 11.0 K/uL   RBC 4.14 3.80 - 5.20 MIL/uL   Hemoglobin 7.9 (L) 12.0 - 16.0 g/dL   HCT 26.6 (L) 35.0 - 47.0 %   MCV 64.2 (L) 80.0 - 100.0 fL  MCH 19.2 (L) 26.0 - 34.0 pg   MCHC 29.9 (L) 32.0 - 36.0 g/dL   RDW 20.3 (H) 11.5 - 14.5 %   Platelets 205 150 - 440 K/uL   Neutrophils Relative % 57 %   Neutro Abs 2.5 1.4 - 6.5 K/uL   Lymphocytes Relative 27 %   Lymphs Abs 1.2 1.0 - 3.6 K/uL   Monocytes Relative 11 %   Monocytes Absolute 0.5 0.2 - 0.9 K/uL   Eosinophils Relative 4 %   Eosinophils Absolute 0.2 0 - 0.7 K/uL   Basophils Relative 1 %    Basophils Absolute 0.0 0 - 0.1 K/uL  Ferritin     Status: Abnormal   Collection Time: 07/29/15  9:07 AM  Result Value Ref Range   Ferritin 8 (L) 11 - 307 ng/mL  Iron and TIBC     Status: Abnormal   Collection Time: 07/29/15  9:07 AM  Result Value Ref Range   Iron 16 (L) 28 - 170 ug/dL   TIBC 371 250 - 450 ug/dL   Saturation Ratios 4 (L) 10.4 - 31.8 %   UIBC 355 ug/dL  CBC with Differential     Status: Abnormal   Collection Time: 08/12/15  8:41 AM  Result Value Ref Range   WBC 3.4 (L) 3.6 - 11.0 K/uL   RBC 4.70 3.80 - 5.20 MIL/uL   Hemoglobin 9.5 (L) 12.0 - 16.0 g/dL   HCT 32.0 (L) 35.0 - 47.0 %   MCV 68.0 (L) 80.0 - 100.0 fL   MCH 20.3 (L) 26.0 - 34.0 pg   MCHC 29.8 (L) 32.0 - 36.0 g/dL   RDW 25.2 (H) 11.5 - 14.5 %   Platelets 165 150 - 440 K/uL   Neutrophils Relative % 56% %   Neutro Abs 1.9 1.4 - 6.5 K/uL   Lymphocytes Relative 29% %   Lymphs Abs 1.0 1.0 - 3.6 K/uL   Monocytes Relative 11% %   Monocytes Absolute 0.4 0.2 - 0.9 K/uL   Eosinophils Relative 3% %   Eosinophils Absolute 0.1 0 - 0.7 K/uL   Basophils Relative 1% %   Basophils Absolute 0.0 0 - 0.1 K/uL  Creatinine, serum     Status: None   Collection Time: 08/12/15  8:41 AM  Result Value Ref Range   Creatinine, Ser 0.79 0.44 - 1.00 mg/dL   GFR calc non Af Amer >60 >60 mL/min   GFR calc Af Amer >60 >60 mL/min    Comment: (NOTE) The eGFR has been calculated using the CKD EPI equation. This calculation has not been validated in all clinical situations. eGFR's persistently <60 mL/min signify possible Chronic Kidney Disease.   Hold Tube- Blood Bank     Status: None   Collection Time: 08/12/15  8:41 AM  Result Value Ref Range   Blood Bank Specimen SAMPLE AVAILABLE FOR TESTING    Sample Expiration 08/15/2015   CBC with Differential     Status: Abnormal   Collection Time: 09/09/15 11:01 AM  Result Value Ref Range   WBC 3.9 3.6 - 11.0 K/uL   RBC 5.38 (H) 3.80 - 5.20 MIL/uL   Hemoglobin 12.1 12.0 - 16.0 g/dL    HCT 39.1 35.0 - 47.0 %   MCV 72.6 (L) 80.0 - 100.0 fL   MCH 22.5 (L) 26.0 - 34.0 pg   MCHC 30.9 (L) 32.0 - 36.0 g/dL   RDW 29.8 (H) 11.5 - 14.5 %   Platelets 128 (L) 150 - 440 K/uL  Neutrophils Relative % 54 %   Neutro Abs 2.1 1.4 - 6.5 K/uL   Lymphocytes Relative 33 %   Lymphs Abs 1.3 1.0 - 3.6 K/uL   Monocytes Relative 9 %   Monocytes Absolute 0.3 0.2 - 0.9 K/uL   Eosinophils Relative 4 %   Eosinophils Absolute 0.2 0 - 0.7 K/uL   Basophils Relative 0 %   Basophils Absolute 0.0 0 - 0.1 K/uL  Ferritin     Status: None   Collection Time: 09/09/15 11:01 AM  Result Value Ref Range   Ferritin 64 11 - 307 ng/mL  Iron and TIBC     Status: None   Collection Time: 09/09/15 11:01 AM  Result Value Ref Range   Iron 62 28 - 170 ug/dL   TIBC 315 250 - 450 ug/dL   Saturation Ratios 20 10.4 - 31.8 %   UIBC 253 ug/dL  Hold Tube- Blood Bank     Status: None   Collection Time: 09/09/15 11:01 AM  Result Value Ref Range   Blood Bank Specimen SAMPLE AVAILABLE FOR TESTING    Sample Expiration 09/12/2015   CBC with Differential/Platelet     Status: Abnormal   Collection Time: 10/21/15  9:39 AM  Result Value Ref Range   WBC 3.8 3.6 - 11.0 K/uL   RBC 4.97 3.80 - 5.20 MIL/uL   Hemoglobin 12.2 12.0 - 16.0 g/dL   HCT 37.7 35.0 - 47.0 %   MCV 75.8 (L) 80.0 - 100.0 fL   MCH 24.5 (L) 26.0 - 34.0 pg   MCHC 32.3 32.0 - 36.0 g/dL   RDW 24.1 (H) 11.5 - 14.5 %   Platelets 142 (L) 150 - 440 K/uL   Neutrophils Relative % 56 %   Neutro Abs 2.2 1.4 - 6.5 K/uL   Lymphocytes Relative 30 %   Lymphs Abs 1.2 1.0 - 3.6 K/uL   Monocytes Relative 8 %   Monocytes Absolute 0.3 0.2 - 0.9 K/uL   Eosinophils Relative 5 %   Eosinophils Absolute 0.2 0 - 0.7 K/uL   Basophils Relative 1 %   Basophils Absolute 0.0 0 - 0.1 K/uL     ASSESSMENT / PLAN:   1. Anemia of iron deficiency and history of GI blood loss  -  have reviewed labs from today and d/w patient.  Her hemoglobin has remained stable since her last  appointment, MCV has improved. She does not have any complaints which would indicate the continues GI bleeding. She was seen by Dr. Vira Agar, and was advised to use FOBT cards if the color of her stool changes, which has not happened yet. At this time there is no need for additional workup or therapeutic interventions. We will recheck her CBC in 1 month, and if stable will see her back in another 2 months with M.D. visit. 2. In between visits, the patient has been advised to call or come to the ER in case of progressive anemia symptoms or acute sickness. She is agreeable to this plan.      Roxana Hires, MD   10/21/2015 10:56 AM

## 2015-11-09 DIAGNOSIS — I350 Nonrheumatic aortic (valve) stenosis: Secondary | ICD-10-CM | POA: Diagnosis not present

## 2015-11-09 DIAGNOSIS — I493 Ventricular premature depolarization: Secondary | ICD-10-CM | POA: Diagnosis not present

## 2015-11-09 DIAGNOSIS — I498 Other specified cardiac arrhythmias: Secondary | ICD-10-CM | POA: Diagnosis not present

## 2015-11-09 DIAGNOSIS — I208 Other forms of angina pectoris: Secondary | ICD-10-CM | POA: Diagnosis not present

## 2015-11-09 DIAGNOSIS — I2510 Atherosclerotic heart disease of native coronary artery without angina pectoris: Secondary | ICD-10-CM | POA: Diagnosis not present

## 2015-11-18 ENCOUNTER — Inpatient Hospital Stay: Payer: Medicare Other | Attending: Internal Medicine

## 2015-11-18 DIAGNOSIS — D509 Iron deficiency anemia, unspecified: Secondary | ICD-10-CM | POA: Insufficient documentation

## 2015-11-18 LAB — CBC WITH DIFFERENTIAL/PLATELET
BASOS ABS: 0 10*3/uL (ref 0–0.1)
Basophils Relative: 1 %
Eosinophils Absolute: 0.2 10*3/uL (ref 0–0.7)
Eosinophils Relative: 4 %
HEMATOCRIT: 38.3 % (ref 35.0–47.0)
HEMOGLOBIN: 12.2 g/dL (ref 12.0–16.0)
LYMPHS PCT: 17 %
Lymphs Abs: 1.2 10*3/uL (ref 1.0–3.6)
MCH: 24.6 pg — ABNORMAL LOW (ref 26.0–34.0)
MCHC: 31.8 g/dL — ABNORMAL LOW (ref 32.0–36.0)
MCV: 77.3 fL — AB (ref 80.0–100.0)
Monocytes Absolute: 0.5 10*3/uL (ref 0.2–0.9)
Monocytes Relative: 7 %
NEUTROS ABS: 4.8 10*3/uL (ref 1.4–6.5)
NEUTROS PCT: 71 %
Platelets: 160 10*3/uL (ref 150–440)
RBC: 4.95 MIL/uL (ref 3.80–5.20)
RDW: 17.4 % — ABNORMAL HIGH (ref 11.5–14.5)
WBC: 6.7 10*3/uL (ref 3.6–11.0)

## 2015-11-18 LAB — SAMPLE TO BLOOD BANK

## 2015-11-18 LAB — FERRITIN: Ferritin: 16 ng/mL (ref 11–307)

## 2015-11-19 DIAGNOSIS — I493 Ventricular premature depolarization: Secondary | ICD-10-CM | POA: Diagnosis not present

## 2015-11-19 DIAGNOSIS — R062 Wheezing: Secondary | ICD-10-CM | POA: Diagnosis not present

## 2015-11-19 DIAGNOSIS — I2510 Atherosclerotic heart disease of native coronary artery without angina pectoris: Secondary | ICD-10-CM | POA: Diagnosis not present

## 2015-11-19 DIAGNOSIS — R002 Palpitations: Secondary | ICD-10-CM | POA: Diagnosis not present

## 2015-12-16 DIAGNOSIS — Z961 Presence of intraocular lens: Secondary | ICD-10-CM | POA: Diagnosis not present

## 2016-01-20 ENCOUNTER — Ambulatory Visit: Payer: Self-pay

## 2016-01-20 ENCOUNTER — Other Ambulatory Visit: Payer: Self-pay

## 2016-03-01 DIAGNOSIS — J302 Other seasonal allergic rhinitis: Secondary | ICD-10-CM | POA: Diagnosis not present

## 2016-03-01 DIAGNOSIS — I2510 Atherosclerotic heart disease of native coronary artery without angina pectoris: Secondary | ICD-10-CM | POA: Diagnosis not present

## 2016-03-01 DIAGNOSIS — K2960 Other gastritis without bleeding: Secondary | ICD-10-CM | POA: Diagnosis not present

## 2016-03-01 DIAGNOSIS — I350 Nonrheumatic aortic (valve) stenosis: Secondary | ICD-10-CM | POA: Diagnosis not present

## 2016-03-07 ENCOUNTER — Other Ambulatory Visit: Payer: Self-pay | Admitting: *Deleted

## 2016-03-07 ENCOUNTER — Inpatient Hospital Stay: Payer: Medicare Other | Attending: Internal Medicine

## 2016-03-07 ENCOUNTER — Inpatient Hospital Stay (HOSPITAL_BASED_OUTPATIENT_CLINIC_OR_DEPARTMENT_OTHER): Payer: Medicare Other | Admitting: Internal Medicine

## 2016-03-07 VITALS — BP 122/70 | HR 74 | Temp 98.3°F | Resp 18 | Wt 166.0 lb

## 2016-03-07 DIAGNOSIS — Z800 Family history of malignant neoplasm of digestive organs: Secondary | ICD-10-CM | POA: Diagnosis not present

## 2016-03-07 DIAGNOSIS — R5383 Other fatigue: Secondary | ICD-10-CM | POA: Insufficient documentation

## 2016-03-07 DIAGNOSIS — D500 Iron deficiency anemia secondary to blood loss (chronic): Secondary | ICD-10-CM

## 2016-03-07 DIAGNOSIS — K922 Gastrointestinal hemorrhage, unspecified: Secondary | ICD-10-CM | POA: Insufficient documentation

## 2016-03-07 DIAGNOSIS — M129 Arthropathy, unspecified: Secondary | ICD-10-CM

## 2016-03-07 DIAGNOSIS — M818 Other osteoporosis without current pathological fracture: Secondary | ICD-10-CM | POA: Insufficient documentation

## 2016-03-07 DIAGNOSIS — J45909 Unspecified asthma, uncomplicated: Secondary | ICD-10-CM

## 2016-03-07 DIAGNOSIS — K219 Gastro-esophageal reflux disease without esophagitis: Secondary | ICD-10-CM | POA: Insufficient documentation

## 2016-03-07 DIAGNOSIS — D509 Iron deficiency anemia, unspecified: Secondary | ICD-10-CM

## 2016-03-07 LAB — CBC WITH DIFFERENTIAL/PLATELET
BASOS PCT: 1 %
Basophils Absolute: 0 10*3/uL (ref 0–0.1)
EOS ABS: 0.4 10*3/uL (ref 0–0.7)
EOS PCT: 8 %
HCT: 26.2 % — ABNORMAL LOW (ref 35.0–47.0)
Hemoglobin: 7.8 g/dL — ABNORMAL LOW (ref 12.0–16.0)
LYMPHS ABS: 0.8 10*3/uL — AB (ref 1.0–3.6)
Lymphocytes Relative: 16 %
MCH: 19.6 pg — AB (ref 26.0–34.0)
MCHC: 29.9 g/dL — AB (ref 32.0–36.0)
MCV: 65.5 fL — ABNORMAL LOW (ref 80.0–100.0)
MONOS PCT: 10 %
Monocytes Absolute: 0.5 10*3/uL (ref 0.2–0.9)
Neutro Abs: 3.5 10*3/uL (ref 1.4–6.5)
Neutrophils Relative %: 65 %
PLATELETS: 242 10*3/uL (ref 150–440)
RBC: 4 MIL/uL (ref 3.80–5.20)
RDW: 18.8 % — ABNORMAL HIGH (ref 11.5–14.5)
WBC: 5.3 10*3/uL (ref 3.6–11.0)

## 2016-03-07 LAB — SAMPLE TO BLOOD BANK

## 2016-03-07 NOTE — Progress Notes (Signed)
[place] OFFICE PROGRESS NOTE  Patient Care Team: Cletis Athens, MD as PCP - General (Internal Medicine)   SUMMARY OF ONCOLOGIC HISTORY:  # 2009- CHRONIC IRON DEFICIENCY ANEMIA sec to GI loss [EGD Dr.Elliot Nov 2015; EGD/colo- 2013-Dr.Iftikar ]; Poor PO tolerance.   INTERVAL HISTORY:  This is my first interaction with the patient since I joined the practice September 2016. I reviewed the patient's prior charts/pertinent labs/imaging in detail; findings are summarized above.   82 year old pleasant female patient with above history of chronic iron deficiency since 2009 status post multiple endoscopies including her last EGD by Dr. Vira Agar in November 2015 is her for follow-up. Patient has been needing IV iron for many years; most recently September 2016.  She does complain of significant fatigue. No nausea no vomiting. She denies any weight loss. Denies any difficulty swallowing. No constipation or diarrhea. She intermittently notes to have black stools- none recently. In general appetite is good.    REVIEW OF SYSTEMS:  A complete 10 point review of system is done which is negative except mentioned above/history of present illness.   PAST MEDICAL HISTORY :  Past Medical History  Diagnosis Date  . Asthma   . Arthritis   . Osteoporosis   . Anemia     IDA  . GERD (gastroesophageal reflux disease)     PAST SURGICAL HISTORY :   Past Surgical History  Procedure Laterality Date  . Cholecystectomy    . Abdominal hysterectomy  1964    ovaries left in  . Cataract extraction      one eye in 2007 and the other eye 2012  . Shoulder surgery  1995  . Knee surgery Right 2004  . Knee surgery Left 2012    FAMILY HISTORY :   Family History  Problem Relation Age of Onset  . Liver cancer Mother   . Arthritis Mother   . Gastric cancer Father   . Asthma Father   . Diabetes Sister   . Diabetes Brother     SOCIAL HISTORY:   Social History  Substance Use Topics  .  Smoking status: Never Smoker   . Smokeless tobacco: Never Used  . Alcohol Use: Yes     Comment: occ. wine    ALLERGIES:  is allergic to aspirin and sulfa antibiotics.  MEDICATIONS:  Current Outpatient Prescriptions  Medication Sig Dispense Refill  . Albuterol Sulfate (PROAIR HFA IN) Inhale into the lungs 4 (four) times daily as needed. 2 puffs    . ALPRAZolam (XANAX) 0.25 MG tablet Take 0.25 mg by mouth at bedtime.  1  . atorvastatin (LIPITOR) 10 MG tablet Take by mouth.    . Cholecalciferol (VITAMIN D3) 2000 UNITS capsule Take by mouth.    . Cyanocobalamin (RA VITAMIN B-12 TR) 1000 MCG TBCR Take by mouth.    . diltiazem (CARDIZEM CD) 120 MG 24 hr capsule Take 120 mg by mouth daily.  4  . fluticasone (FLONASE) 50 MCG/ACT nasal spray Place into the nose.    Marland Kitchen Fluticasone-Salmeterol (ADVAIR) 250-50 MCG/DOSE AEPB Inhale 1 puff into the lungs 2 (two) times daily.    . Glycerin-Hypromellose-PEG 400 ([facility] DRY EYE RELIEF) 0.2-0.2-1 % SOLN Apply to eye.    . iron sucrose (VENOFER) 20 MG/ML injection Inject into the vein.    Marland Kitchen loratadine (CLARITIN) 10 MG tablet Take 10 mg by mouth daily.    . Multiple Vitamin (MULTI-VITAMINS) TABS Take by mouth.    . Omega-3 Fatty Acids (FISH OIL) 1000  MG CPDR Take by mouth.    Marland Kitchen omeprazole (PRILOSEC) 20 MG capsule Take 20 mg by mouth daily as needed.    . sucralfate (CARAFATE) 1 G tablet Take 1 g by mouth 4 (four) times daily.     No current facility-administered medications for this visit.    PHYSICAL EXAMINATION:   BP 122/70 mmHg  Pulse 74  Temp(Src) 98.3 F (36.8 C) (Tympanic)  Resp 18  Wt 166 lb 0.1 oz (75.3 kg)  Filed Weights   03/07/16 0955  Weight: 166 lb 0.1 oz (75.3 kg)    GENERAL: Well-nourished well-developed; Alert, no distress and comfortable.She is here alone today EYES: positive for pallor.  OROPHARYNX: no thrush or ulceration NECK: supple, no masses felt LYMPH:  no palpable lymphadenopathy in the cervical, axillary or inguinal  regions LUNGS: clear to auscultation and  No wheeze or crackles HEART/CVS: regular rate & rhythm and no murmurs; No lower extremity edema ABDOMEN:abdomen soft, non-tender and normal bowel sounds Musculoskeletal:no cyanosis of digits and no clubbing  PSYCH: alert & oriented x 3 with fluent speech NEURO: no focal motor/sensory deficits SKIN:  no rashes or significant lesions  LABORATORY DATA:  I have reviewed the data as listed    Component Value Date/Time   CREATININE 0.79 08/12/2015 0841   CREATININE 0.98 02/19/2014 0902   PROT 6.5 02/19/2014 0902   ALBUMIN 3.2* 02/19/2014 0902   AST 18 02/19/2014 0902   ALT 22 02/19/2014 0902   ALKPHOS 95 02/19/2014 0902   BILITOT 0.4 02/19/2014 0902   GFRNONAA >60 08/12/2015 0841   GFRNONAA 54* 02/19/2014 0902   GFRAA >60 08/12/2015 0841   GFRAA >60 02/19/2014 0902    No results found for: SPEP, UPEP  Lab Results  Component Value Date   WBC 5.3 03/07/2016   NEUTROABS 3.5 03/07/2016   HGB 7.8* 03/07/2016   HCT 26.2* 03/07/2016   MCV 65.5* 03/07/2016   PLT 242 03/07/2016      Chemistry      Component Value Date/Time   CREATININE 0.79 08/12/2015 0841   CREATININE 0.98 02/19/2014 0902      Component Value Date/Time   ALKPHOS 95 02/19/2014 0902   AST 18 02/19/2014 0902   ALT 22 02/19/2014 0902   BILITOT 0.4 02/19/2014 0902       ASSESSMENT & PLAN:   # Iron deficiency anemia chronic likely GI blood loss. Multiple endoscopies and colonoscopies. Today hemoglobin is 7.8; MCV is 65. Ferritin from today is pending. However ferritin from December 2016-16.  # I think patient would benefit from loading IV iron now Feraheme 2; and then also Feraheme maintenance every 3 months or so. The loading iron will be started this week; we will get H&H in 2 weeks; monthly CBC follow-up in 3 months/IV Feraheme. Patient will get labs done.few days before the next visit.    Cammie Sickle, MD 03/07/2016 10:25 AM

## 2016-03-08 ENCOUNTER — Inpatient Hospital Stay: Payer: Medicare Other

## 2016-03-08 VITALS — BP 135/52 | HR 74 | Temp 98.4°F

## 2016-03-08 DIAGNOSIS — Z800 Family history of malignant neoplasm of digestive organs: Secondary | ICD-10-CM | POA: Diagnosis not present

## 2016-03-08 DIAGNOSIS — D509 Iron deficiency anemia, unspecified: Secondary | ICD-10-CM

## 2016-03-08 DIAGNOSIS — K922 Gastrointestinal hemorrhage, unspecified: Secondary | ICD-10-CM | POA: Diagnosis not present

## 2016-03-08 DIAGNOSIS — M129 Arthropathy, unspecified: Secondary | ICD-10-CM | POA: Diagnosis not present

## 2016-03-08 DIAGNOSIS — K219 Gastro-esophageal reflux disease without esophagitis: Secondary | ICD-10-CM | POA: Diagnosis not present

## 2016-03-08 DIAGNOSIS — R5383 Other fatigue: Secondary | ICD-10-CM | POA: Diagnosis not present

## 2016-03-08 DIAGNOSIS — D500 Iron deficiency anemia secondary to blood loss (chronic): Secondary | ICD-10-CM | POA: Diagnosis not present

## 2016-03-08 DIAGNOSIS — J45909 Unspecified asthma, uncomplicated: Secondary | ICD-10-CM | POA: Diagnosis not present

## 2016-03-08 DIAGNOSIS — M818 Other osteoporosis without current pathological fracture: Secondary | ICD-10-CM | POA: Diagnosis not present

## 2016-03-08 MED ORDER — SODIUM CHLORIDE 0.9 % IV SOLN
510.0000 mg | Freq: Once | INTRAVENOUS | Status: AC
  Administered 2016-03-08: 510 mg via INTRAVENOUS
  Filled 2016-03-08: qty 17

## 2016-03-08 MED ORDER — SODIUM CHLORIDE 0.9 % IV SOLN
Freq: Once | INTRAVENOUS | Status: AC
  Administered 2016-03-08: 11:00:00 via INTRAVENOUS
  Filled 2016-03-08: qty 1000

## 2016-03-15 ENCOUNTER — Inpatient Hospital Stay: Payer: Medicare Other

## 2016-03-15 VITALS — BP 140/60 | HR 60 | Temp 98.1°F | Resp 18

## 2016-03-15 DIAGNOSIS — D509 Iron deficiency anemia, unspecified: Secondary | ICD-10-CM

## 2016-03-15 DIAGNOSIS — K922 Gastrointestinal hemorrhage, unspecified: Secondary | ICD-10-CM | POA: Diagnosis not present

## 2016-03-15 DIAGNOSIS — Z800 Family history of malignant neoplasm of digestive organs: Secondary | ICD-10-CM | POA: Diagnosis not present

## 2016-03-15 DIAGNOSIS — M129 Arthropathy, unspecified: Secondary | ICD-10-CM | POA: Diagnosis not present

## 2016-03-15 DIAGNOSIS — M818 Other osteoporosis without current pathological fracture: Secondary | ICD-10-CM | POA: Diagnosis not present

## 2016-03-15 DIAGNOSIS — J45909 Unspecified asthma, uncomplicated: Secondary | ICD-10-CM | POA: Diagnosis not present

## 2016-03-15 DIAGNOSIS — D500 Iron deficiency anemia secondary to blood loss (chronic): Secondary | ICD-10-CM | POA: Diagnosis not present

## 2016-03-15 DIAGNOSIS — R5383 Other fatigue: Secondary | ICD-10-CM | POA: Diagnosis not present

## 2016-03-15 DIAGNOSIS — K219 Gastro-esophageal reflux disease without esophagitis: Secondary | ICD-10-CM | POA: Diagnosis not present

## 2016-03-15 LAB — HEMOGLOBIN: Hemoglobin: 8.6 g/dL — ABNORMAL LOW (ref 12.0–16.0)

## 2016-03-15 LAB — HEMATOCRIT: HCT: 28.4 % — ABNORMAL LOW (ref 35.0–47.0)

## 2016-03-15 MED ORDER — SODIUM CHLORIDE 0.9 % IV SOLN
Freq: Once | INTRAVENOUS | Status: AC
  Administered 2016-03-15: 11:00:00 via INTRAVENOUS
  Filled 2016-03-15: qty 1000

## 2016-03-15 MED ORDER — SODIUM CHLORIDE 0.9 % IV SOLN
510.0000 mg | Freq: Once | INTRAVENOUS | Status: AC
  Administered 2016-03-15: 510 mg via INTRAVENOUS
  Filled 2016-03-15: qty 17

## 2016-04-04 ENCOUNTER — Inpatient Hospital Stay: Payer: Medicare Other | Attending: Internal Medicine

## 2016-04-04 ENCOUNTER — Other Ambulatory Visit: Payer: Self-pay | Admitting: Internal Medicine

## 2016-04-04 ENCOUNTER — Telehealth: Payer: Self-pay | Admitting: *Deleted

## 2016-04-04 DIAGNOSIS — D509 Iron deficiency anemia, unspecified: Secondary | ICD-10-CM

## 2016-04-04 DIAGNOSIS — D500 Iron deficiency anemia secondary to blood loss (chronic): Secondary | ICD-10-CM | POA: Diagnosis not present

## 2016-04-04 LAB — CBC WITH DIFFERENTIAL/PLATELET
Basophils Absolute: 0 10*3/uL (ref 0–0.1)
Basophils Relative: 1 %
EOS ABS: 0.3 10*3/uL (ref 0–0.7)
EOS PCT: 6 %
HCT: 38.7 % (ref 35.0–47.0)
Hemoglobin: 11.9 g/dL — ABNORMAL LOW (ref 12.0–16.0)
LYMPHS ABS: 1.4 10*3/uL (ref 1.0–3.6)
Lymphocytes Relative: 31 %
MCH: 23.8 pg — AB (ref 26.0–34.0)
MCHC: 30.9 g/dL — ABNORMAL LOW (ref 32.0–36.0)
MCV: 77.2 fL — AB (ref 80.0–100.0)
MONOS PCT: 9 %
Monocytes Absolute: 0.4 10*3/uL (ref 0.2–0.9)
Neutro Abs: 2.4 10*3/uL (ref 1.4–6.5)
Neutrophils Relative %: 53 %
Platelets: 135 10*3/uL — ABNORMAL LOW (ref 150–440)
RBC: 5.01 MIL/uL (ref 3.80–5.20)
RDW: 29.9 % — ABNORMAL HIGH (ref 11.5–14.5)
WBC: 4.6 10*3/uL (ref 3.6–11.0)

## 2016-04-04 NOTE — Telephone Encounter (Signed)
Called patient to inform her that hgb is improving.  MD will continue to check CBC on monthly basis and plan IV feraheme @ next appointment. Patient verbalized understanding.

## 2016-04-04 NOTE — Telephone Encounter (Signed)
-----   Message from Cammie Sickle, MD sent at 04/04/2016  1:04 PM EDT ----- Please inform patient that her hemoglobin is improving; continue CBC check a monthly basis. Plan likely IV Ferrahem at  next visit with MD. Corrin Parker

## 2016-05-02 ENCOUNTER — Inpatient Hospital Stay: Payer: Medicare Other

## 2016-05-02 DIAGNOSIS — D509 Iron deficiency anemia, unspecified: Secondary | ICD-10-CM

## 2016-05-02 DIAGNOSIS — D500 Iron deficiency anemia secondary to blood loss (chronic): Secondary | ICD-10-CM | POA: Diagnosis not present

## 2016-05-02 LAB — CBC WITH DIFFERENTIAL/PLATELET
Basophils Absolute: 0 10*3/uL (ref 0–0.1)
Basophils Relative: 1 %
EOS ABS: 0.4 10*3/uL (ref 0–0.7)
EOS PCT: 9 %
HCT: 38.3 % (ref 35.0–47.0)
HEMOGLOBIN: 12.1 g/dL (ref 12.0–16.0)
LYMPHS ABS: 1.2 10*3/uL (ref 1.0–3.6)
LYMPHS PCT: 24 %
MCH: 24.6 pg — AB (ref 26.0–34.0)
MCHC: 31.6 g/dL — AB (ref 32.0–36.0)
MCV: 77.7 fL — AB (ref 80.0–100.0)
MONOS PCT: 9 %
Monocytes Absolute: 0.4 10*3/uL (ref 0.2–0.9)
Neutro Abs: 2.8 10*3/uL (ref 1.4–6.5)
Neutrophils Relative %: 57 %
Platelets: 146 10*3/uL — ABNORMAL LOW (ref 150–440)
RBC: 4.93 MIL/uL (ref 3.80–5.20)
RDW: 25.8 % — ABNORMAL HIGH (ref 11.5–14.5)
WBC: 4.8 10*3/uL (ref 3.6–11.0)

## 2016-06-13 ENCOUNTER — Inpatient Hospital Stay: Payer: Medicare Other | Attending: Internal Medicine

## 2016-06-13 ENCOUNTER — Inpatient Hospital Stay: Payer: Medicare Other

## 2016-06-13 ENCOUNTER — Inpatient Hospital Stay (HOSPITAL_BASED_OUTPATIENT_CLINIC_OR_DEPARTMENT_OTHER): Payer: Medicare Other | Admitting: Internal Medicine

## 2016-06-13 VITALS — BP 132/64 | HR 63 | Temp 96.6°F | Resp 18

## 2016-06-13 VITALS — BP 139/82 | HR 80 | Temp 98.2°F | Resp 18 | Wt 168.0 lb

## 2016-06-13 DIAGNOSIS — Z79899 Other long term (current) drug therapy: Secondary | ICD-10-CM | POA: Diagnosis not present

## 2016-06-13 DIAGNOSIS — K922 Gastrointestinal hemorrhage, unspecified: Secondary | ICD-10-CM | POA: Diagnosis not present

## 2016-06-13 DIAGNOSIS — M129 Arthropathy, unspecified: Secondary | ICD-10-CM | POA: Insufficient documentation

## 2016-06-13 DIAGNOSIS — D509 Iron deficiency anemia, unspecified: Secondary | ICD-10-CM | POA: Insufficient documentation

## 2016-06-13 DIAGNOSIS — R5383 Other fatigue: Secondary | ICD-10-CM

## 2016-06-13 DIAGNOSIS — J45909 Unspecified asthma, uncomplicated: Secondary | ICD-10-CM

## 2016-06-13 DIAGNOSIS — M818 Other osteoporosis without current pathological fracture: Secondary | ICD-10-CM | POA: Diagnosis not present

## 2016-06-13 DIAGNOSIS — K219 Gastro-esophageal reflux disease without esophagitis: Secondary | ICD-10-CM | POA: Insufficient documentation

## 2016-06-13 DIAGNOSIS — Z800 Family history of malignant neoplasm of digestive organs: Secondary | ICD-10-CM | POA: Diagnosis not present

## 2016-06-13 LAB — CBC WITH DIFFERENTIAL/PLATELET
Basophils Absolute: 0 10*3/uL (ref 0–0.1)
Basophils Relative: 1 %
EOS PCT: 5 %
Eosinophils Absolute: 0.2 10*3/uL (ref 0–0.7)
HEMATOCRIT: 37.4 % (ref 35.0–47.0)
HEMOGLOBIN: 12 g/dL (ref 12.0–16.0)
LYMPHS ABS: 1.3 10*3/uL (ref 1.0–3.6)
LYMPHS PCT: 25 %
MCH: 24.9 pg — AB (ref 26.0–34.0)
MCHC: 32 g/dL (ref 32.0–36.0)
MCV: 77.7 fL — AB (ref 80.0–100.0)
Monocytes Absolute: 0.5 10*3/uL (ref 0.2–0.9)
Monocytes Relative: 9 %
NEUTROS ABS: 3.1 10*3/uL (ref 1.4–6.5)
Neutrophils Relative %: 60 %
Platelets: 220 10*3/uL (ref 150–440)
RBC: 4.82 MIL/uL (ref 3.80–5.20)
RDW: 16.4 % — ABNORMAL HIGH (ref 11.5–14.5)
WBC: 5.1 10*3/uL (ref 3.6–11.0)

## 2016-06-13 MED ORDER — SODIUM CHLORIDE 0.9 % IV SOLN
Freq: Once | INTRAVENOUS | Status: AC
  Administered 2016-06-13: 12:00:00 via INTRAVENOUS
  Filled 2016-06-13: qty 1000

## 2016-06-13 MED ORDER — SODIUM CHLORIDE 0.9 % IV SOLN
510.0000 mg | Freq: Once | INTRAVENOUS | Status: AC
  Administered 2016-06-13: 510 mg via INTRAVENOUS
  Filled 2016-06-13: qty 17

## 2016-06-13 NOTE — Progress Notes (Signed)
[place] OFFICE PROGRESS NOTE  Patient Care Team: Cletis Athens, MD as PCP - General (Internal Medicine)   SUMMARY OF ONCOLOGIC HISTORY:  # 2009- CHRONIC IRON DEFICIENCY ANEMIA sec to GI loss [EGD Dr.Elliot Nov 2015; EGD/colo- 2013-Dr.Iftikar ]; Poor PO tolerance.   INTERVAL HISTORY:  82 -year-old pleasant female patient with above history of chronic iron deficiency since 2009 status post multiple endoscopies including her last EGD by Dr. Vira Agar in November 2015 is her for follow-up- patient received IV iron in April 2017 and the hemoglobin was down to 8.   Patient noted significant improvement in her fatigue since the IV iron; however she continues to feel mild fatigue. No nausea no vomiting. She denies any weight loss. Denies any difficulty swallowing. No constipation or diarrhea.  In general appetite is good.   REVIEW OF SYSTEMS:  A complete 10 point review of system is done which is negative except mentioned above/history of present illness.   PAST MEDICAL HISTORY :  Past Medical History  Diagnosis Date  . Asthma   . Arthritis   . Osteoporosis   . Anemia     IDA  . GERD (gastroesophageal reflux disease)     PAST SURGICAL HISTORY :   Past Surgical History  Procedure Laterality Date  . Cholecystectomy    . Abdominal hysterectomy  1964    ovaries left in  . Cataract extraction      one eye in 2007 and the other eye 2012  . Shoulder surgery  1995  . Knee surgery Right 2004  . Knee surgery Left 2012    FAMILY HISTORY :   Family History  Problem Relation Age of Onset  . Liver cancer Mother   . Arthritis Mother   . Gastric cancer Father   . Asthma Father   . Diabetes Sister   . Diabetes Brother     SOCIAL HISTORY:   Social History  Substance Use Topics  . Smoking status: Never Smoker   . Smokeless tobacco: Never Used  . Alcohol Use: Yes     Comment: occ. wine    ALLERGIES:  is allergic to aspirin and sulfa antibiotics.  MEDICATIONS:   Current Outpatient Prescriptions  Medication Sig Dispense Refill  . Albuterol Sulfate (PROAIR HFA IN) Inhale into the lungs 4 (four) times daily as needed. 2 puffs    . ALPRAZolam (XANAX) 0.25 MG tablet Take 0.25 mg by mouth at bedtime. Reported on 06/13/2016  1  . atorvastatin (LIPITOR) 10 MG tablet Take by mouth.    . Cholecalciferol (VITAMIN D3) 2000 UNITS capsule Take by mouth.    . diltiazem (CARDIZEM CD) 120 MG 24 hr capsule Take 120 mg by mouth daily.  4  . fluticasone (FLONASE) 50 MCG/ACT nasal spray Place into the nose.    Marland Kitchen Fluticasone-Salmeterol (ADVAIR) 250-50 MCG/DOSE AEPB Inhale 1 puff into the lungs 2 (two) times daily.    . Glycerin-Hypromellose-PEG 400 ([employer] DRY EYE RELIEF) 0.2-0.2-1 % SOLN Apply to eye.    . loratadine (CLARITIN) 10 MG tablet Take 10 mg by mouth daily. Reported on 06/13/2016    . Multiple Vitamin (MULTI-VITAMINS) TABS Take by mouth. Reported on 06/13/2016    . Omega-3 Fatty Acids (FISH OIL) 1000 MG CPDR Take by mouth.    Marland Kitchen omeprazole (PRILOSEC) 20 MG capsule Take 20 mg by mouth daily as needed.    . sucralfate (CARAFATE) 1 G tablet Take 1 g by mouth 4 (four) times daily.  No current facility-administered medications for this visit.    PHYSICAL EXAMINATION:   BP 139/82 mmHg  Pulse 80  Temp(Src) 98.2 F (36.8 C) (Tympanic)  Resp 18  Wt 167 lb 15.9 oz (76.2 kg)  Filed Weights   06/13/16 1052  Weight: 167 lb 15.9 oz (76.2 kg)    GENERAL: Well-nourished well-developed; Alert, no distress and comfortable.She is here alone today EYES: positive for pallor.  OROPHARYNX: no thrush or ulceration NECK: supple, no masses felt LYMPH:  no palpable lymphadenopathy in the cervical, axillary or inguinal regions LUNGS: clear to auscultation and  No wheeze or crackles HEART/CVS: regular rate & rhythm and no murmurs; No lower extremity edema ABDOMEN:abdomen soft, non-tender and normal bowel sounds Musculoskeletal:no cyanosis of digits and no clubbing  PSYCH:  alert & oriented x 3 with fluent speech NEURO: no focal motor/sensory deficits SKIN:  no rashes or significant lesions  LABORATORY DATA:  I have reviewed the data as listed    Component Value Date/Time   CREATININE 0.79 08/12/2015 0841   CREATININE 0.98 02/19/2014 0902   PROT 6.5 02/19/2014 0902   ALBUMIN 3.2* 02/19/2014 0902   AST 18 02/19/2014 0902   ALT 22 02/19/2014 0902   ALKPHOS 95 02/19/2014 0902   BILITOT 0.4 02/19/2014 0902   GFRNONAA >60 08/12/2015 0841   GFRNONAA 54* 02/19/2014 0902   GFRAA >60 08/12/2015 0841   GFRAA >60 02/19/2014 0902    No results found for: SPEP, UPEP  Lab Results  Component Value Date   WBC 5.1 06/13/2016   NEUTROABS 3.1 06/13/2016   HGB 12.0 06/13/2016   HCT 37.4 06/13/2016   MCV 77.7* 06/13/2016   PLT 220 06/13/2016      Chemistry      Component Value Date/Time   CREATININE 0.79 08/12/2015 0841   CREATININE 0.98 02/19/2014 0902      Component Value Date/Time   ALKPHOS 95 02/19/2014 0902   AST 18 02/19/2014 0902   ALT 22 02/19/2014 0902   BILITOT 0.4 02/19/2014 0902       ASSESSMENT & PLAN:  IDA (iron deficiency anemia) # Iron deficiency anemia chronic likely GI blood loss. Multiple endoscopies and colonoscopies. Status post IV iron Feraheme 2 in April 2017 hemoglobin today is 12 however MCV 77- continues to be iron deficient/fatigue. Recommend IV iron Feraheme today.   # Follow-up labs IV Feraheme in 4 months    # 15 minutes face-to-face with the patient discussing the above plan of care; more than 50% of time spent on natural history; counseling and coordination.   Cammie Sickle, MD 06/13/2016 11:11 AM

## 2016-06-13 NOTE — Assessment & Plan Note (Addendum)
#   Iron deficiency anemia chronic likely GI blood loss. Multiple endoscopies and colonoscopies. Status post IV iron Feraheme 2 in April 2017 hemoglobin today is 12 however MCV 77- continues to be iron deficient/fatigue. Recommend IV iron Feraheme today.   # Follow-up labs IV Feraheme in 4 months

## 2016-09-25 ENCOUNTER — Other Ambulatory Visit: Payer: Self-pay | Admitting: Internal Medicine

## 2016-09-25 DIAGNOSIS — Z1231 Encounter for screening mammogram for malignant neoplasm of breast: Secondary | ICD-10-CM

## 2016-10-10 ENCOUNTER — Inpatient Hospital Stay: Payer: Medicare Other | Attending: Internal Medicine

## 2016-10-10 DIAGNOSIS — Z800 Family history of malignant neoplasm of digestive organs: Secondary | ICD-10-CM | POA: Diagnosis not present

## 2016-10-10 DIAGNOSIS — Z79899 Other long term (current) drug therapy: Secondary | ICD-10-CM | POA: Diagnosis not present

## 2016-10-10 DIAGNOSIS — K922 Gastrointestinal hemorrhage, unspecified: Secondary | ICD-10-CM | POA: Insufficient documentation

## 2016-10-10 DIAGNOSIS — K219 Gastro-esophageal reflux disease without esophagitis: Secondary | ICD-10-CM | POA: Insufficient documentation

## 2016-10-10 DIAGNOSIS — J449 Chronic obstructive pulmonary disease, unspecified: Secondary | ICD-10-CM | POA: Insufficient documentation

## 2016-10-10 DIAGNOSIS — D500 Iron deficiency anemia secondary to blood loss (chronic): Secondary | ICD-10-CM | POA: Diagnosis not present

## 2016-10-10 DIAGNOSIS — J45909 Unspecified asthma, uncomplicated: Secondary | ICD-10-CM | POA: Diagnosis not present

## 2016-10-10 DIAGNOSIS — D509 Iron deficiency anemia, unspecified: Secondary | ICD-10-CM

## 2016-10-10 DIAGNOSIS — Z90710 Acquired absence of both cervix and uterus: Secondary | ICD-10-CM | POA: Diagnosis not present

## 2016-10-10 DIAGNOSIS — M818 Other osteoporosis without current pathological fracture: Secondary | ICD-10-CM | POA: Insufficient documentation

## 2016-10-10 DIAGNOSIS — M129 Arthropathy, unspecified: Secondary | ICD-10-CM | POA: Insufficient documentation

## 2016-10-10 DIAGNOSIS — Z9049 Acquired absence of other specified parts of digestive tract: Secondary | ICD-10-CM | POA: Diagnosis not present

## 2016-10-10 LAB — CBC WITH DIFFERENTIAL/PLATELET
Basophils Absolute: 0 10*3/uL (ref 0–0.1)
Basophils Relative: 1 %
EOS PCT: 4 %
Eosinophils Absolute: 0.2 10*3/uL (ref 0–0.7)
HCT: 35.9 % (ref 35.0–47.0)
Hemoglobin: 11.7 g/dL — ABNORMAL LOW (ref 12.0–16.0)
LYMPHS ABS: 1.3 10*3/uL (ref 1.0–3.6)
LYMPHS PCT: 25 %
MCH: 25.7 pg — AB (ref 26.0–34.0)
MCHC: 32.5 g/dL (ref 32.0–36.0)
MCV: 79.3 fL — AB (ref 80.0–100.0)
MONOS PCT: 9 %
Monocytes Absolute: 0.4 10*3/uL (ref 0.2–0.9)
Neutro Abs: 3.1 10*3/uL (ref 1.4–6.5)
Neutrophils Relative %: 61 %
PLATELETS: 165 10*3/uL (ref 150–440)
RBC: 4.53 MIL/uL (ref 3.80–5.20)
RDW: 16.1 % — ABNORMAL HIGH (ref 11.5–14.5)
WBC: 5.1 10*3/uL (ref 3.6–11.0)

## 2016-10-10 LAB — IRON AND TIBC
Iron: 31 ug/dL (ref 28–170)
SATURATION RATIOS: 9 % — AB (ref 10.4–31.8)
TIBC: 332 ug/dL (ref 250–450)
UIBC: 301 ug/dL

## 2016-10-10 LAB — FERRITIN: Ferritin: 11 ng/mL (ref 11–307)

## 2016-10-17 ENCOUNTER — Inpatient Hospital Stay (HOSPITAL_BASED_OUTPATIENT_CLINIC_OR_DEPARTMENT_OTHER): Payer: Medicare Other | Admitting: Internal Medicine

## 2016-10-17 ENCOUNTER — Inpatient Hospital Stay: Payer: Medicare Other

## 2016-10-17 VITALS — BP 138/74 | HR 70 | Temp 98.0°F | Resp 18 | Wt 170.1 lb

## 2016-10-17 DIAGNOSIS — D500 Iron deficiency anemia secondary to blood loss (chronic): Secondary | ICD-10-CM

## 2016-10-17 DIAGNOSIS — J45909 Unspecified asthma, uncomplicated: Secondary | ICD-10-CM

## 2016-10-17 DIAGNOSIS — M129 Arthropathy, unspecified: Secondary | ICD-10-CM

## 2016-10-17 DIAGNOSIS — Z79899 Other long term (current) drug therapy: Secondary | ICD-10-CM

## 2016-10-17 DIAGNOSIS — Z800 Family history of malignant neoplasm of digestive organs: Secondary | ICD-10-CM | POA: Diagnosis not present

## 2016-10-17 DIAGNOSIS — K922 Gastrointestinal hemorrhage, unspecified: Secondary | ICD-10-CM

## 2016-10-17 DIAGNOSIS — D509 Iron deficiency anemia, unspecified: Secondary | ICD-10-CM

## 2016-10-17 DIAGNOSIS — J449 Chronic obstructive pulmonary disease, unspecified: Secondary | ICD-10-CM | POA: Diagnosis not present

## 2016-10-17 DIAGNOSIS — M818 Other osteoporosis without current pathological fracture: Secondary | ICD-10-CM | POA: Diagnosis not present

## 2016-10-17 DIAGNOSIS — Z9049 Acquired absence of other specified parts of digestive tract: Secondary | ICD-10-CM

## 2016-10-17 DIAGNOSIS — Z90710 Acquired absence of both cervix and uterus: Secondary | ICD-10-CM

## 2016-10-17 DIAGNOSIS — K219 Gastro-esophageal reflux disease without esophagitis: Secondary | ICD-10-CM | POA: Diagnosis not present

## 2016-10-17 MED ORDER — SODIUM CHLORIDE 0.9 % IV SOLN
Freq: Once | INTRAVENOUS | Status: AC
  Administered 2016-10-17: 12:00:00 via INTRAVENOUS
  Filled 2016-10-17: qty 1000

## 2016-10-17 MED ORDER — FERUMOXYTOL INJECTION 510 MG/17 ML
510.0000 mg | Freq: Once | INTRAVENOUS | Status: AC
  Administered 2016-10-17: 510 mg via INTRAVENOUS
  Filled 2016-10-17: qty 17

## 2016-10-17 NOTE — Progress Notes (Signed)
Patient is here for follow up  

## 2016-10-17 NOTE — Progress Notes (Signed)
Daisy Holloway: Daisy Athens, MD as PCP - General (Internal Medicine)   SUMMARY OF ONCOLOGIC HISTORY:  # 2009- CHRONIC IRON DEFICIENCY ANEMIA sec to GI loss [EGD Dr.Elliot Nov 2015; EGD/colo- 2013-Dr.Iftikar ]; Poor PO tolerance. On IV ferraheam q 3-4 months  # 2017- COPD [never smoker]  INTERVAL HISTORY:  82 -year-old pleasant female patient with above history of chronic iron deficiency since 2009 status post multiple endoscopies including her last EGD by Dr. Vira Holloway in November 2015 is her for follow-up. Patient has been needing IV iron every 3-4 months.  Interestingly patient had been diagnosed with COPD- she has been needing to use inhalers. She has never been smoker. Positive for exposure to secondary smoke. She is concerned about bleeding during the winter.  Patient noted significant improvement in her fatigue since the IV iron; however she continues to feel mild fatigue. No nausea no vomiting. She denies any weight loss. Denies any difficulty swallowing. No constipation or diarrhea.   REVIEW OF SYSTEMS:  A complete 10 point review of system is done which is negative except mentioned above/history of present illness.   PAST MEDICAL HISTORY :  Past Medical History:  Diagnosis Date  . Anemia    IDA  . Arthritis   . Asthma   . GERD (gastroesophageal reflux disease)   . Osteoporosis     PAST SURGICAL HISTORY :   Past Surgical History:  Procedure Laterality Date  . ABDOMINAL HYSTERECTOMY  1964   ovaries left in  . CATARACT EXTRACTION     one eye in 2007 and the other eye 2012  . CHOLECYSTECTOMY    . KNEE SURGERY Right 2004  . KNEE SURGERY Left 2012  . SHOULDER SURGERY  1995    FAMILY HISTORY :   Family History  Problem Relation Age of Onset  . Liver cancer Mother   . Arthritis Mother   . Gastric cancer Father   . Asthma Father   . Diabetes Sister   . Diabetes Brother     SOCIAL HISTORY:   Social History   Substance Use Topics  . Smoking status: Never Smoker  . Smokeless tobacco: Never Used  . Alcohol use Yes     Comment: occ. wine    ALLERGIES:  is allergic to aspirin and sulfa antibiotics.  MEDICATIONS:  Current Outpatient Prescriptions  Medication Sig Dispense Refill  . Albuterol Sulfate (PROAIR HFA IN) Inhale into the lungs 4 (four) times daily as needed. 2 puffs    . atorvastatin (LIPITOR) 10 MG tablet Take by mouth.    . Cholecalciferol (VITAMIN D3) 2000 UNITS capsule Take 5,000 Units by mouth daily.     Marland Kitchen diltiazem (CARDIZEM CD) 120 MG 24 hr capsule Take 120 mg by mouth daily.  4  . fluticasone (FLONASE) 50 MCG/ACT nasal spray Place into the nose.    Marland Kitchen Fluticasone-Salmeterol (ADVAIR) 250-50 MCG/DOSE AEPB Inhale 1 puff into the lungs 2 (two) times daily.    . Glycerin-Hypromellose-PEG 400 ([employer] DRY EYE RELIEF) 0.2-0.2-1 % SOLN Apply to eye.    . Multiple Vitamin (MULTI-VITAMINS) TABS Take by mouth. Reported on 06/13/2016    . Omega-3 Fatty Acids (FISH OIL) 1000 MG CPDR Take by mouth.    Marland Kitchen omeprazole (PRILOSEC) 20 MG capsule Take 20 mg by mouth daily as needed.    . sodium chloride (OCEAN) 0.65 % nasal spray Place into the nose.    . sucralfate (CARAFATE) 1 G tablet Take 1  g by mouth 4 (four) times daily.    Marland Kitchen ALPRAZolam (XANAX) 0.25 MG tablet Take 0.25 mg by mouth at bedtime. Reported on 06/13/2016  1  . loratadine (CLARITIN) 10 MG tablet Take 10 mg by mouth daily. Reported on 06/13/2016     No current facility-administered medications for this visit.     PHYSICAL EXAMINATION:   BP 138/74 (BP Location: Left Arm, Patient Position: Sitting)   Pulse 70   Temp 98 F (36.7 C) (Tympanic)   Resp 18   Wt 170 lb 1.4 oz (77.2 kg)   SpO2 97%   BMI 29.22 kg/m   Filed Weights   10/17/16 1111  Weight: 170 lb 1.4 oz (77.2 kg)    GENERAL: Well-nourished well-developed; Alert, no distress and comfortable.She is here alone today EYES: positive for pallor.  OROPHARYNX: no thrush or  ulceration NECK: supple, no masses felt LYMPH:  no palpable lymphadenopathy in the cervical, axillary or inguinal regions LUNGS: clear to auscultation and  No wheeze or crackles HEART/CVS: regular rate & rhythm and no murmurs; No lower extremity edema ABDOMEN:abdomen soft, non-tender and normal bowel sounds Musculoskeletal:no cyanosis of digits and no clubbing  PSYCH: alert & oriented x 3 with fluent speech NEURO: no focal motor/sensory deficits SKIN:  no rashes or significant lesions  LABORATORY DATA:  I have reviewed the data as listed    Component Value Date/Time   CREATININE 0.79 08/12/2015 0841   CREATININE 0.98 02/19/2014 0902   PROT 6.5 02/19/2014 0902   ALBUMIN 3.2 (L) 02/19/2014 0902   AST 18 02/19/2014 0902   ALT 22 02/19/2014 0902   ALKPHOS 95 02/19/2014 0902   BILITOT 0.4 02/19/2014 0902   GFRNONAA >60 08/12/2015 0841   GFRNONAA 54 (L) 02/19/2014 0902   GFRAA >60 08/12/2015 0841   GFRAA >60 02/19/2014 0902    No results found for: SPEP, UPEP  Lab Results  Component Value Date   WBC 5.1 10/10/2016   NEUTROABS 3.1 10/10/2016   HGB 11.7 (L) 10/10/2016   HCT 35.9 10/10/2016   MCV 79.3 (L) 10/10/2016   PLT 165 10/10/2016      Chemistry      Component Value Date/Time   CREATININE 0.79 08/12/2015 0841   CREATININE 0.98 02/19/2014 0902      Component Value Date/Time   ALKPHOS 95 02/19/2014 0902   AST 18 02/19/2014 0902   ALT 22 02/19/2014 0902   BILITOT 0.4 02/19/2014 0902       ASSESSMENT & PLAN:  Iron deficiency anemia due to chronic blood loss # Iron deficiency anemia chronic likely chronic GI blood loss. Multiple endoscopies and colonoscopies. Today hemoglobin is 11.7; saturation 9% ferritin 11. Recommend IV Feraheme.   # Follow-up labs IV Feraheme/M.D. in 4 months.; Labs- 1 week before.     Daisy Sickle, MD 10/17/2016 11:30 AM

## 2016-10-17 NOTE — Assessment & Plan Note (Signed)
#   Iron deficiency anemia chronic likely chronic GI blood loss. Multiple endoscopies and colonoscopies. Today hemoglobin is 11.7; saturation 9% ferritin 11. Recommend IV Feraheme.   # Follow-up labs IV Feraheme/M.D. in 4 months.; Labs- 1 week before.

## 2016-10-20 DIAGNOSIS — I350 Nonrheumatic aortic (valve) stenosis: Secondary | ICD-10-CM | POA: Diagnosis not present

## 2016-10-20 DIAGNOSIS — I2510 Atherosclerotic heart disease of native coronary artery without angina pectoris: Secondary | ICD-10-CM | POA: Diagnosis not present

## 2016-10-20 DIAGNOSIS — R062 Wheezing: Secondary | ICD-10-CM | POA: Diagnosis not present

## 2016-10-20 DIAGNOSIS — I208 Other forms of angina pectoris: Secondary | ICD-10-CM | POA: Diagnosis not present

## 2016-10-25 ENCOUNTER — Ambulatory Visit
Admission: RE | Admit: 2016-10-25 | Discharge: 2016-10-25 | Disposition: A | Discharge status: Home / Self Care | Payer: Medicare Other | Source: Ambulatory Visit | Attending: Internal Medicine | Admitting: Internal Medicine

## 2016-10-25 DIAGNOSIS — Z1231 Encounter for screening mammogram for malignant neoplasm of breast: Secondary | ICD-10-CM | POA: Diagnosis not present

## 2016-11-07 DIAGNOSIS — Z96659 Presence of unspecified artificial knee joint: Secondary | ICD-10-CM | POA: Diagnosis not present

## 2016-11-07 DIAGNOSIS — I350 Nonrheumatic aortic (valve) stenosis: Secondary | ICD-10-CM | POA: Diagnosis not present

## 2016-11-07 DIAGNOSIS — I493 Ventricular premature depolarization: Secondary | ICD-10-CM | POA: Diagnosis not present

## 2016-11-07 DIAGNOSIS — I208 Other forms of angina pectoris: Secondary | ICD-10-CM | POA: Diagnosis not present

## 2017-02-13 ENCOUNTER — Other Ambulatory Visit: Payer: Self-pay | Admitting: Internal Medicine

## 2017-02-13 ENCOUNTER — Inpatient Hospital Stay: Payer: Medicare Other | Attending: Internal Medicine

## 2017-02-13 DIAGNOSIS — K219 Gastro-esophageal reflux disease without esophagitis: Secondary | ICD-10-CM | POA: Insufficient documentation

## 2017-02-13 DIAGNOSIS — M818 Other osteoporosis without current pathological fracture: Secondary | ICD-10-CM | POA: Insufficient documentation

## 2017-02-13 DIAGNOSIS — M129 Arthropathy, unspecified: Secondary | ICD-10-CM | POA: Insufficient documentation

## 2017-02-13 DIAGNOSIS — J45909 Unspecified asthma, uncomplicated: Secondary | ICD-10-CM | POA: Diagnosis not present

## 2017-02-13 DIAGNOSIS — Z800 Family history of malignant neoplasm of digestive organs: Secondary | ICD-10-CM | POA: Insufficient documentation

## 2017-02-13 DIAGNOSIS — D500 Iron deficiency anemia secondary to blood loss (chronic): Secondary | ICD-10-CM

## 2017-02-13 DIAGNOSIS — Z79899 Other long term (current) drug therapy: Secondary | ICD-10-CM | POA: Insufficient documentation

## 2017-02-13 DIAGNOSIS — D509 Iron deficiency anemia, unspecified: Secondary | ICD-10-CM | POA: Diagnosis not present

## 2017-02-13 DIAGNOSIS — J449 Chronic obstructive pulmonary disease, unspecified: Secondary | ICD-10-CM | POA: Insufficient documentation

## 2017-02-13 DIAGNOSIS — Z7982 Long term (current) use of aspirin: Secondary | ICD-10-CM | POA: Diagnosis not present

## 2017-02-13 DIAGNOSIS — Z803 Family history of malignant neoplasm of breast: Secondary | ICD-10-CM | POA: Diagnosis not present

## 2017-02-13 DIAGNOSIS — Z9049 Acquired absence of other specified parts of digestive tract: Secondary | ICD-10-CM | POA: Insufficient documentation

## 2017-02-13 LAB — BASIC METABOLIC PANEL
Anion gap: 8 (ref 5–15)
BUN: 11 mg/dL (ref 6–20)
CHLORIDE: 104 mmol/L (ref 101–111)
CO2: 29 mmol/L (ref 22–32)
CREATININE: 0.8 mg/dL (ref 0.44–1.00)
Calcium: 9.3 mg/dL (ref 8.9–10.3)
GFR calc non Af Amer: >60 mL/min (ref >60)
Glucose, Bld: 99 mg/dL (ref 65–99)
Potassium: 4.4 mmol/L (ref 3.5–5.1)
Sodium: 141 mmol/L (ref 135–145)

## 2017-02-13 LAB — FERRITIN: Ferritin: 10 ng/mL — ABNORMAL LOW (ref 11–307)

## 2017-02-13 LAB — CBC WITH DIFFERENTIAL/PLATELET
Basophils Absolute: 0 10*3/uL (ref 0–0.1)
Basophils Relative: 1 %
Eosinophils Absolute: 0.2 10*3/uL (ref 0–0.7)
Eosinophils Relative: 5 %
HEMATOCRIT: 32.2 % — AB (ref 35.0–47.0)
HEMOGLOBIN: 9.9 g/dL — AB (ref 12.0–16.0)
LYMPHS ABS: 1 10*3/uL (ref 1.0–3.6)
Lymphocytes Relative: 21 %
MCH: 21.6 pg — ABNORMAL LOW (ref 26.0–34.0)
MCHC: 30.7 g/dL — AB (ref 32.0–36.0)
MCV: 70.4 fL — ABNORMAL LOW (ref 80.0–100.0)
MONOS PCT: 8 %
Monocytes Absolute: 0.4 10*3/uL (ref 0.2–0.9)
NEUTROS ABS: 3.1 10*3/uL (ref 1.4–6.5)
NEUTROS PCT: 65 %
Platelets: 228 10*3/uL (ref 150–440)
RBC: 4.58 MIL/uL (ref 3.80–5.20)
RDW: 19.8 % — ABNORMAL HIGH (ref 11.5–14.5)
WBC: 4.8 10*3/uL (ref 3.6–11.0)

## 2017-02-13 LAB — IRON AND TIBC
IRON: 25 ug/dL — AB (ref 28–170)
Saturation Ratios: 7 % — ABNORMAL LOW (ref 10.4–31.8)
TIBC: 379 ug/dL (ref 250–450)
UIBC: 354 ug/dL

## 2017-02-20 ENCOUNTER — Inpatient Hospital Stay (HOSPITAL_BASED_OUTPATIENT_CLINIC_OR_DEPARTMENT_OTHER): Payer: Medicare Other | Admitting: Internal Medicine

## 2017-02-20 ENCOUNTER — Inpatient Hospital Stay: Payer: Medicare Other

## 2017-02-20 VITALS — BP 120/72 | HR 69 | Temp 97.9°F | Resp 18 | Wt 166.4 lb

## 2017-02-20 VITALS — BP 103/64 | HR 65 | Resp 18

## 2017-02-20 DIAGNOSIS — M129 Arthropathy, unspecified: Secondary | ICD-10-CM

## 2017-02-20 DIAGNOSIS — Z800 Family history of malignant neoplasm of digestive organs: Secondary | ICD-10-CM | POA: Diagnosis not present

## 2017-02-20 DIAGNOSIS — Z79899 Other long term (current) drug therapy: Secondary | ICD-10-CM

## 2017-02-20 DIAGNOSIS — K219 Gastro-esophageal reflux disease without esophagitis: Secondary | ICD-10-CM

## 2017-02-20 DIAGNOSIS — Z7982 Long term (current) use of aspirin: Secondary | ICD-10-CM

## 2017-02-20 DIAGNOSIS — Z803 Family history of malignant neoplasm of breast: Secondary | ICD-10-CM

## 2017-02-20 DIAGNOSIS — D509 Iron deficiency anemia, unspecified: Secondary | ICD-10-CM

## 2017-02-20 DIAGNOSIS — M818 Other osteoporosis without current pathological fracture: Secondary | ICD-10-CM

## 2017-02-20 DIAGNOSIS — J45909 Unspecified asthma, uncomplicated: Secondary | ICD-10-CM

## 2017-02-20 DIAGNOSIS — D500 Iron deficiency anemia secondary to blood loss (chronic): Secondary | ICD-10-CM

## 2017-02-20 DIAGNOSIS — Z9049 Acquired absence of other specified parts of digestive tract: Secondary | ICD-10-CM | POA: Diagnosis not present

## 2017-02-20 DIAGNOSIS — J449 Chronic obstructive pulmonary disease, unspecified: Secondary | ICD-10-CM

## 2017-02-20 MED ORDER — SODIUM CHLORIDE 0.9 % IV SOLN
Freq: Once | INTRAVENOUS | Status: AC
  Administered 2017-02-20: 10:00:00 via INTRAVENOUS
  Filled 2017-02-20: qty 1000

## 2017-02-20 MED ORDER — FERUMOXYTOL INJECTION 510 MG/17 ML
510.0000 mg | Freq: Once | INTRAVENOUS | Status: AC
  Administered 2017-02-20: 510 mg via INTRAVENOUS
  Filled 2017-02-20: qty 17

## 2017-02-20 NOTE — Progress Notes (Signed)
[facility] OFFICE PROGRESS NOTE  Patient Care Team: Cletis Athens, MD as PCP - General (Internal Medicine)   SUMMARY OF ONCOLOGIC HISTORY:  # 2009- CHRONIC IRON DEFICIENCY ANEMIA sec to GI loss [EGD Dr.Elliot Nov 2015; EGD/colo- 2013-Dr.Iftikar ]; Poor PO tolerance. On IV ferraheam q 3-4 months  # 2017- COPD [never smoker]  INTERVAL HISTORY:  83 -year-old pleasant female patient with above history of chronic iron deficiency since 2009 status post multiple endoscopies including her last EGD by Dr. Vira Agar in November 2015 is her for follow-up. Patient has been needing IV iron every 3-4 months.  Patient stated that she has been tired a lot in the last few weeks. She does not take by mouth iron because of intolerance. She has a craving for ice. Patient noted significant improvement in her fatigue since last IV iron. No nausea no vomiting. She denies any weight loss. Denies any difficulty swallowing. No constipation or diarrhea.   REVIEW OF SYSTEMS:  A complete 10 point review of system is done which is negative except mentioned above/history of present illness.   PAST MEDICAL HISTORY :  Past Medical History:  Diagnosis Date  . Anemia    IDA  . Arthritis   . Asthma   . GERD (gastroesophageal reflux disease)   . Osteoporosis     PAST SURGICAL HISTORY :   Past Surgical History:  Procedure Laterality Date  . ABDOMINAL HYSTERECTOMY  1964   ovaries left in  . BREAST BIOPSY Right 1998   patient states calcifications  . CATARACT EXTRACTION     one eye in 2007 and the other eye 2012  . CHOLECYSTECTOMY    . KNEE SURGERY Right 2004  . KNEE SURGERY Left 2012  . SHOULDER SURGERY  1995    FAMILY HISTORY :   Family History  Problem Relation Age of Onset  . Liver cancer Mother   . Arthritis Mother   . Gastric cancer Father   . Asthma Father   . Diabetes Sister   . Diabetes Brother   . Breast cancer Maternal Aunt   . Breast cancer Cousin     SOCIAL HISTORY:    Social History  Substance Use Topics  . Smoking status: Never Smoker  . Smokeless tobacco: Never Used  . Alcohol use Yes     Comment: occ. wine    ALLERGIES:  is allergic to aspirin and sulfa antibiotics.  MEDICATIONS:  Current Outpatient Prescriptions  Medication Sig Dispense Refill  . Albuterol Sulfate (PROAIR HFA IN) Inhale into the lungs 4 (four) times daily as needed. 2 puffs    . ALPRAZolam (XANAX) 0.25 MG tablet Take 0.25 mg by mouth at bedtime. Reported on 06/13/2016  1  . aspirin EC 81 MG tablet Take 81 mg by mouth daily.    Marland Kitchen atorvastatin (LIPITOR) 10 MG tablet Take by mouth.    . Cholecalciferol (VITAMIN D3) 2000 UNITS capsule Take 5,000 Units by mouth daily.     Marland Kitchen diltiazem (CARDIZEM CD) 120 MG 24 hr capsule Take 120 mg by mouth daily.  4  . fluticasone (FLONASE) 50 MCG/ACT nasal spray Place into the nose.    Marland Kitchen Fluticasone-Salmeterol (ADVAIR) 250-50 MCG/DOSE AEPB Inhale 1 puff into the lungs 2 (two) times daily.    . Glycerin-Hypromellose-PEG 400 ([facility] DRY EYE RELIEF) 0.2-0.2-1 % SOLN Apply to eye.    Marland Kitchen ipratropium-albuterol (DUONEB) 0.5-2.5 (3) MG/3ML SOLN See admin instructions.  8  . loratadine (CLARITIN) 10 MG tablet Take 10 mg  by mouth daily. Reported on 06/13/2016    . Multiple Vitamin (MULTI-VITAMINS) TABS Take by mouth. Reported on 06/13/2016    . nystatin-triamcinolone (MYCOLOG II) cream APPLY TO AFFECTED AREA TWICE A DAY  5  . Omega-3 Fatty Acids (FISH OIL) 1000 MG CPDR Take by mouth.    Marland Kitchen omeprazole (PRILOSEC) 20 MG capsule Take 20 mg by mouth daily as needed.    . sodium chloride (OCEAN) 0.65 % nasal spray Place into the nose.    . sucralfate (CARAFATE) 1 G tablet Take 1 g by mouth 4 (four) times daily.    . vitamin B-12 (CYANOCOBALAMIN) 1000 MCG tablet Take by mouth.     No current facility-administered medications for this visit.    Facility-Administered Medications Ordered in Other Visits  Medication Dose Route Frequency Provider Last Rate Last Dose  .  ferumoxytol (FERAHEME) 510 mg in sodium chloride 0.9 % 100 mL IVPB  510 mg Intravenous Once Cammie Sickle, MD        PHYSICAL EXAMINATION:   BP 120/72 (BP Location: Right Arm, Patient Position: Sitting)   Pulse 69   Temp 97.9 F (36.6 C) (Tympanic)   Resp 18   Wt 166 lb 7.2 oz (75.5 kg)   BMI 28.59 kg/m   Filed Weights   02/20/17 0956  Weight: 166 lb 7.2 oz (75.5 kg)    GENERAL: Well-nourished well-developed; Alert, no distress and comfortable.She is here alone today EYES: positive for pallor.  OROPHARYNX: no thrush or ulceration NECK: supple, no masses felt LYMPH:  no palpable lymphadenopathy in the cervical, axillary or inguinal regions LUNGS: clear to auscultation and  No wheeze or crackles HEART/CVS: regular rate & rhythm and no murmurs; No lower extremity edema ABDOMEN:abdomen soft, non-tender and normal bowel sounds Musculoskeletal:no cyanosis of digits and no clubbing  PSYCH: alert & oriented x 3 with fluent speech NEURO: no focal motor/sensory deficits SKIN:  no rashes or significant lesions  LABORATORY DATA:  I have reviewed the data as listed    Component Value Date/Time   NA 141 02/13/2017 1045   K 4.4 02/13/2017 1045   CL 104 02/13/2017 1045   CO2 29 02/13/2017 1045   GLUCOSE 99 02/13/2017 1045   BUN 11 02/13/2017 1045   CREATININE 0.80 02/13/2017 1045   CREATININE 0.98 02/19/2014 0902   CALCIUM 9.3 02/13/2017 1045   PROT 6.5 02/19/2014 0902   ALBUMIN 3.2 (L) 02/19/2014 0902   AST 18 02/19/2014 0902   ALT 22 02/19/2014 0902   ALKPHOS 95 02/19/2014 0902   BILITOT 0.4 02/19/2014 0902   GFRNONAA >60 02/13/2017 1045   GFRNONAA 54 (L) 02/19/2014 0902   GFRAA >60 02/13/2017 1045   GFRAA >60 02/19/2014 0902    No results found for: SPEP, UPEP  Lab Results  Component Value Date   WBC 4.8 02/13/2017   NEUTROABS 3.1 02/13/2017   HGB 9.9 (L) 02/13/2017   HCT 32.2 (L) 02/13/2017   MCV 70.4 (L) 02/13/2017   PLT 228 02/13/2017      Chemistry       Component Value Date/Time   NA 141 02/13/2017 1045   K 4.4 02/13/2017 1045   CL 104 02/13/2017 1045   CO2 29 02/13/2017 1045   BUN 11 02/13/2017 1045   CREATININE 0.80 02/13/2017 1045   CREATININE 0.98 02/19/2014 0902      Component Value Date/Time   CALCIUM 9.3 02/13/2017 1045   ALKPHOS 95 02/19/2014 0902   AST 18 02/19/2014 0902  ALT 22 02/19/2014 0902   BILITOT 0.4 02/19/2014 0902       ASSESSMENT & PLAN:  Iron deficiency anemia due to chronic blood loss # Iron deficiency anemia chronic likely chronic GI blood loss. Multiple endoscopies and colonoscopies. Patient is symptomatic from her anemia.  Today hemoglobin is 9.9 ; saturation 9% ferritin 11. Recommend IV Feraheme weekly 2.  PO intolerance to PO iron.    # Follow-up labs IV Feraheme/M.D. in 3 months.; Labs- few days prior.     Cammie Sickle, MD 02/20/2017 10:32 AM

## 2017-02-20 NOTE — Progress Notes (Signed)
Patient here today for follow up.  Patient states no new concerns today  

## 2017-02-20 NOTE — Patient Instructions (Signed)

## 2017-02-20 NOTE — Assessment & Plan Note (Addendum)
#   Iron deficiency anemia chronic likely chronic GI blood loss. Multiple endoscopies and colonoscopies. Patient is symptomatic from her anemia.  Today hemoglobin is 9.9 ; saturation 9% ferritin 11. Recommend IV Feraheme weekly 2.  PO intolerance to PO iron.    # Follow-up labs IV Feraheme/M.D. in 3 months.; Labs- few days prior.

## 2017-02-27 ENCOUNTER — Inpatient Hospital Stay: Payer: Medicare Other

## 2017-02-27 ENCOUNTER — Ambulatory Visit: Payer: Self-pay

## 2017-02-27 VITALS — BP 121/69 | HR 64 | Temp 98.8°F | Resp 18

## 2017-02-27 DIAGNOSIS — M818 Other osteoporosis without current pathological fracture: Secondary | ICD-10-CM | POA: Diagnosis not present

## 2017-02-27 DIAGNOSIS — Z79899 Other long term (current) drug therapy: Secondary | ICD-10-CM | POA: Diagnosis not present

## 2017-02-27 DIAGNOSIS — M129 Arthropathy, unspecified: Secondary | ICD-10-CM | POA: Diagnosis not present

## 2017-02-27 DIAGNOSIS — K219 Gastro-esophageal reflux disease without esophagitis: Secondary | ICD-10-CM | POA: Diagnosis not present

## 2017-02-27 DIAGNOSIS — D509 Iron deficiency anemia, unspecified: Secondary | ICD-10-CM | POA: Diagnosis not present

## 2017-02-27 DIAGNOSIS — J449 Chronic obstructive pulmonary disease, unspecified: Secondary | ICD-10-CM | POA: Diagnosis not present

## 2017-02-27 DIAGNOSIS — Z803 Family history of malignant neoplasm of breast: Secondary | ICD-10-CM | POA: Diagnosis not present

## 2017-02-27 DIAGNOSIS — J45909 Unspecified asthma, uncomplicated: Secondary | ICD-10-CM | POA: Diagnosis not present

## 2017-02-27 DIAGNOSIS — Z800 Family history of malignant neoplasm of digestive organs: Secondary | ICD-10-CM | POA: Diagnosis not present

## 2017-02-27 DIAGNOSIS — Z9049 Acquired absence of other specified parts of digestive tract: Secondary | ICD-10-CM | POA: Diagnosis not present

## 2017-02-27 DIAGNOSIS — Z7982 Long term (current) use of aspirin: Secondary | ICD-10-CM | POA: Diagnosis not present

## 2017-02-27 MED ORDER — SODIUM CHLORIDE 0.9 % IV SOLN
Freq: Once | INTRAVENOUS | Status: AC
  Administered 2017-02-27: 14:00:00 via INTRAVENOUS
  Filled 2017-02-27: qty 1000

## 2017-02-27 MED ORDER — SODIUM CHLORIDE 0.9 % IV SOLN
510.0000 mg | Freq: Once | INTRAVENOUS | Status: AC
  Administered 2017-02-27: 510 mg via INTRAVENOUS
  Filled 2017-02-27: qty 17

## 2017-02-27 NOTE — Patient Instructions (Signed)

## 2017-03-08 ENCOUNTER — Inpatient Hospital Stay: Payer: Medicare Other | Attending: Internal Medicine

## 2017-03-08 DIAGNOSIS — D500 Iron deficiency anemia secondary to blood loss (chronic): Secondary | ICD-10-CM

## 2017-03-08 DIAGNOSIS — D509 Iron deficiency anemia, unspecified: Secondary | ICD-10-CM | POA: Diagnosis not present

## 2017-03-08 DIAGNOSIS — M818 Other osteoporosis without current pathological fracture: Secondary | ICD-10-CM | POA: Diagnosis not present

## 2017-03-08 DIAGNOSIS — K5900 Constipation, unspecified: Secondary | ICD-10-CM | POA: Diagnosis not present

## 2017-03-08 DIAGNOSIS — K219 Gastro-esophageal reflux disease without esophagitis: Secondary | ICD-10-CM | POA: Diagnosis not present

## 2017-03-08 DIAGNOSIS — J449 Chronic obstructive pulmonary disease, unspecified: Secondary | ICD-10-CM | POA: Diagnosis not present

## 2017-03-08 DIAGNOSIS — Z803 Family history of malignant neoplasm of breast: Secondary | ICD-10-CM | POA: Diagnosis not present

## 2017-03-08 DIAGNOSIS — Z7982 Long term (current) use of aspirin: Secondary | ICD-10-CM | POA: Insufficient documentation

## 2017-03-08 DIAGNOSIS — Z800 Family history of malignant neoplasm of digestive organs: Secondary | ICD-10-CM | POA: Diagnosis not present

## 2017-03-08 DIAGNOSIS — J45909 Unspecified asthma, uncomplicated: Secondary | ICD-10-CM | POA: Insufficient documentation

## 2017-03-08 DIAGNOSIS — Z79899 Other long term (current) drug therapy: Secondary | ICD-10-CM | POA: Insufficient documentation

## 2017-03-08 LAB — CBC WITH DIFFERENTIAL/PLATELET
BASOS ABS: 0 10*3/uL (ref 0–0.1)
BASOS PCT: 0 %
EOS PCT: 3 %
Eosinophils Absolute: 0.1 10*3/uL (ref 0–0.7)
HEMATOCRIT: 38.4 % (ref 35.0–47.0)
Hemoglobin: 11.9 g/dL — ABNORMAL LOW (ref 12.0–16.0)
LYMPHS PCT: 21 %
Lymphs Abs: 0.9 10*3/uL — ABNORMAL LOW (ref 1.0–3.6)
MCH: 23.2 pg — ABNORMAL LOW (ref 26.0–34.0)
MCHC: 31 g/dL — ABNORMAL LOW (ref 32.0–36.0)
MCV: 75 fL — AB (ref 80.0–100.0)
MONO ABS: 0.4 10*3/uL (ref 0.2–0.9)
Monocytes Relative: 9 %
NEUTROS ABS: 3 10*3/uL (ref 1.4–6.5)
Neutrophils Relative %: 67 %
PLATELETS: 165 10*3/uL (ref 150–440)
RBC: 5.12 MIL/uL (ref 3.80–5.20)
RDW: 28.8 % — AB (ref 11.5–14.5)
WBC: 4.4 10*3/uL (ref 3.6–11.0)

## 2017-03-08 LAB — COMPREHENSIVE METABOLIC PANEL
ALBUMIN: 4 g/dL (ref 3.5–5.0)
ALT: 17 U/L (ref 14–54)
AST: 23 U/L (ref 15–41)
Alkaline Phosphatase: 89 U/L (ref 38–126)
Anion gap: 7 (ref 5–15)
BUN: 18 mg/dL (ref 6–20)
CHLORIDE: 104 mmol/L (ref 101–111)
CO2: 27 mmol/L (ref 22–32)
CREATININE: 0.74 mg/dL (ref 0.44–1.00)
Calcium: 9.6 mg/dL (ref 8.9–10.3)
GFR calc Af Amer: >60 mL/min (ref >60)
GFR calc non Af Amer: >60 mL/min (ref >60)
GLUCOSE: 87 mg/dL (ref 65–99)
POTASSIUM: 4 mmol/L (ref 3.5–5.1)
Sodium: 138 mmol/L (ref 135–145)
Total Bilirubin: 0.7 mg/dL (ref 0.3–1.2)
Total Protein: 7.2 g/dL (ref 6.5–8.1)

## 2017-03-08 LAB — IRON AND TIBC
Iron: 70 ug/dL (ref 28–170)
SATURATION RATIOS: 24 % (ref 10.4–31.8)
TIBC: 288 ug/dL (ref 250–450)
UIBC: 218 ug/dL

## 2017-03-08 LAB — FERRITIN: Ferritin: 352 ng/mL — ABNORMAL HIGH (ref 11–307)

## 2017-03-13 ENCOUNTER — Inpatient Hospital Stay: Payer: Medicare Other

## 2017-03-13 ENCOUNTER — Ambulatory Visit: Payer: Self-pay | Admitting: Internal Medicine

## 2017-03-13 ENCOUNTER — Inpatient Hospital Stay (HOSPITAL_BASED_OUTPATIENT_CLINIC_OR_DEPARTMENT_OTHER): Payer: Medicare Other | Admitting: Internal Medicine

## 2017-03-13 VITALS — BP 120/69 | HR 63 | Temp 97.6°F | Resp 16 | Wt 164.4 lb

## 2017-03-13 DIAGNOSIS — K219 Gastro-esophageal reflux disease without esophagitis: Secondary | ICD-10-CM | POA: Diagnosis not present

## 2017-03-13 DIAGNOSIS — D509 Iron deficiency anemia, unspecified: Secondary | ICD-10-CM | POA: Diagnosis not present

## 2017-03-13 DIAGNOSIS — Z800 Family history of malignant neoplasm of digestive organs: Secondary | ICD-10-CM | POA: Diagnosis not present

## 2017-03-13 DIAGNOSIS — D500 Iron deficiency anemia secondary to blood loss (chronic): Secondary | ICD-10-CM

## 2017-03-13 DIAGNOSIS — J449 Chronic obstructive pulmonary disease, unspecified: Secondary | ICD-10-CM

## 2017-03-13 DIAGNOSIS — Z79899 Other long term (current) drug therapy: Secondary | ICD-10-CM

## 2017-03-13 DIAGNOSIS — K5900 Constipation, unspecified: Secondary | ICD-10-CM

## 2017-03-13 DIAGNOSIS — J45909 Unspecified asthma, uncomplicated: Secondary | ICD-10-CM

## 2017-03-13 DIAGNOSIS — Z7982 Long term (current) use of aspirin: Secondary | ICD-10-CM | POA: Diagnosis not present

## 2017-03-13 DIAGNOSIS — M818 Other osteoporosis without current pathological fracture: Secondary | ICD-10-CM | POA: Diagnosis not present

## 2017-03-13 DIAGNOSIS — Z803 Family history of malignant neoplasm of breast: Secondary | ICD-10-CM

## 2017-03-13 MED ORDER — SODIUM CHLORIDE 0.9 % IV SOLN
510.0000 mg | Freq: Once | INTRAVENOUS | Status: AC
  Administered 2017-03-13: 510 mg via INTRAVENOUS
  Filled 2017-03-13: qty 17

## 2017-03-13 MED ORDER — SODIUM CHLORIDE 0.9 % IV SOLN
Freq: Once | INTRAVENOUS | Status: AC
Start: 2017-03-13 — End: 2017-03-13
  Administered 2017-03-13: 10:00:00 via INTRAVENOUS
  Filled 2017-03-13: qty 1000

## 2017-03-13 NOTE — Progress Notes (Signed)
[facility] OFFICE PROGRESS NOTE  Patient Care Team: Cletis Athens, MD as PCP - General (Internal Medicine)   SUMMARY OF ONCOLOGIC HISTORY:  # 2009- CHRONIC IRON DEFICIENCY ANEMIA sec to GI loss [EGD Dr.Elliot Nov 2015; EGD/colo- 2013-Dr.Iftikar ]; Poor PO tolerance. On IV ferraheam q 3-4 months  # 2017- COPD [never smoker]  INTERVAL HISTORY:  83 -year-old pleasant female patient with above history of chronic iron deficiency since 2009 status post multiple endoscopies including her last EGD by Dr. Vira Agar in November 2015 is her for follow-up. Patient has been needing IV iron every 3-4 months.  Patient last received iron approximately month ago. She has not been on oral iron because of constipation.  Patient noted some improvement in her energy levels post IV iron infusion. However still fatigued.  She has a craving for ice.  No nausea no vomiting. She denies any weight loss. Denies any difficulty swallowing. No constipation or diarrhea. No blood in stools or black stools.  REVIEW OF SYSTEMS:  A complete 10 point review of system is done which is negative except mentioned above/history of present illness.   PAST MEDICAL HISTORY :  Past Medical History:  Diagnosis Date  . Anemia    IDA  . Arthritis   . Asthma   . GERD (gastroesophageal reflux disease)   . Osteoporosis     PAST SURGICAL HISTORY :   Past Surgical History:  Procedure Laterality Date  . ABDOMINAL HYSTERECTOMY  1964   ovaries left in  . BREAST BIOPSY Right 1998   patient states calcifications  . CATARACT EXTRACTION     one eye in 2007 and the other eye 2012  . CHOLECYSTECTOMY    . KNEE SURGERY Right 2004  . KNEE SURGERY Left 2012  . SHOULDER SURGERY  1995    FAMILY HISTORY :   Family History  Problem Relation Age of Onset  . Liver cancer Mother   . Arthritis Mother   . Gastric cancer Father   . Asthma Father   . Diabetes Sister   . Diabetes Brother   . Breast cancer Maternal Aunt   .  Breast cancer Cousin     SOCIAL HISTORY:   Social History  Substance Use Topics  . Smoking status: Never Smoker  . Smokeless tobacco: Never Used  . Alcohol use Yes     Comment: occ. wine    ALLERGIES:  is allergic to aspirin and sulfa antibiotics.  MEDICATIONS:  Current Outpatient Prescriptions  Medication Sig Dispense Refill  . Albuterol Sulfate (PROAIR HFA IN) Inhale into the lungs 4 (four) times daily as needed. 2 puffs    . ALPRAZolam (XANAX) 0.25 MG tablet Take 0.25 mg by mouth at bedtime. Reported on 06/13/2016  1  . aspirin EC 81 MG tablet Take 81 mg by mouth daily.    Marland Kitchen atorvastatin (LIPITOR) 10 MG tablet Take by mouth.    . Cholecalciferol (VITAMIN D3) 2000 UNITS capsule Take 5,000 Units by mouth daily.     Marland Kitchen diltiazem (CARDIZEM CD) 120 MG 24 hr capsule Take 120 mg by mouth daily.  4  . fluticasone (FLONASE) 50 MCG/ACT nasal spray Place into the nose.    Marland Kitchen Fluticasone-Salmeterol (ADVAIR) 250-50 MCG/DOSE AEPB Inhale 1 puff into the lungs 2 (two) times daily.    . Glycerin-Hypromellose-PEG 400 ([employer] DRY EYE RELIEF) 0.2-0.2-1 % SOLN Apply to eye.    Marland Kitchen ipratropium-albuterol (DUONEB) 0.5-2.5 (3) MG/3ML SOLN See admin instructions.  8  . loratadine (CLARITIN)  10 MG tablet Take 10 mg by mouth daily. Reported on 06/13/2016    . Multiple Vitamin (MULTI-VITAMINS) TABS Take by mouth. Reported on 06/13/2016    . nystatin-triamcinolone (MYCOLOG II) cream APPLY TO AFFECTED AREA TWICE A DAY  5  . Omega-3 Fatty Acids (FISH OIL) 1000 MG CPDR Take by mouth.    Marland Kitchen omeprazole (PRILOSEC) 20 MG capsule Take 20 mg by mouth daily as needed.    . sodium chloride (OCEAN) 0.65 % nasal spray Place into the nose.    . sucralfate (CARAFATE) 1 G tablet Take 1 g by mouth 4 (four) times daily.    . vitamin B-12 (CYANOCOBALAMIN) 1000 MCG tablet Take by mouth.     No current facility-administered medications for this visit.     PHYSICAL EXAMINATION:   BP 120/69 (BP Location: Left Arm, Patient Position:  Sitting)   Pulse 63   Temp 97.6 F (36.4 C) (Tympanic)   Resp 16   Wt 164 lb 5.7 oz (74.6 kg)   BMI 28.23 kg/m   Filed Weights   03/13/17 0958  Weight: 164 lb 5.7 oz (74.6 kg)    GENERAL: Well-nourished well-developed; Alert, no distress and comfortable.She is here alone today EYES: positive for pallor.  OROPHARYNX: no thrush or ulceration NECK: supple, no masses felt LYMPH:  no palpable lymphadenopathy in the cervical, axillary or inguinal regions LUNGS: clear to auscultation and  No wheeze or crackles HEART/CVS: regular rate & rhythm and no murmurs; No lower extremity edema ABDOMEN:abdomen soft, non-tender and normal bowel sounds Musculoskeletal:no cyanosis of digits and no clubbing  PSYCH: alert & oriented x 3 with fluent speech NEURO: no focal motor/sensory deficits SKIN:  no rashes or significant lesions  LABORATORY DATA:  I have reviewed the data as listed    Component Value Date/Time   NA 138 03/08/2017 1038   K 4.0 03/08/2017 1038   CL 104 03/08/2017 1038   CO2 27 03/08/2017 1038   GLUCOSE 87 03/08/2017 1038   BUN 18 03/08/2017 1038   CREATININE 0.74 03/08/2017 1038   CREATININE 0.98 02/19/2014 0902   CALCIUM 9.6 03/08/2017 1038   PROT 7.2 03/08/2017 1038   PROT 6.5 02/19/2014 0902   ALBUMIN 4.0 03/08/2017 1038   ALBUMIN 3.2 (L) 02/19/2014 0902   AST 23 03/08/2017 1038   AST 18 02/19/2014 0902   ALT 17 03/08/2017 1038   ALT 22 02/19/2014 0902   ALKPHOS 89 03/08/2017 1038   ALKPHOS 95 02/19/2014 0902   BILITOT 0.7 03/08/2017 1038   BILITOT 0.4 02/19/2014 0902   GFRNONAA >60 03/08/2017 1038   GFRNONAA 54 (L) 02/19/2014 0902   GFRAA >60 03/08/2017 1038   GFRAA >60 02/19/2014 0902    No results found for: SPEP, UPEP  Lab Results  Component Value Date   WBC 4.4 03/08/2017   NEUTROABS 3.0 03/08/2017   HGB 11.9 (L) 03/08/2017   HCT 38.4 03/08/2017   MCV 75.0 (L) 03/08/2017   PLT 165 03/08/2017      Chemistry      Component Value Date/Time   NA  138 03/08/2017 1038   K 4.0 03/08/2017 1038   CL 104 03/08/2017 1038   CO2 27 03/08/2017 1038   BUN 18 03/08/2017 1038   CREATININE 0.74 03/08/2017 1038   CREATININE 0.98 02/19/2014 0902      Component Value Date/Time   CALCIUM 9.6 03/08/2017 1038   ALKPHOS 89 03/08/2017 1038   ALKPHOS 95 02/19/2014 0902   AST 23 03/08/2017 1038  AST 18 02/19/2014 0902   ALT 17 03/08/2017 1038   ALT 22 02/19/2014 0902   BILITOT 0.7 03/08/2017 1038   BILITOT 0.4 02/19/2014 0902       ASSESSMENT & PLAN:  Iron deficiency anemia due to chronic blood loss # Iron deficiency anemia chronic likely chronic GI blood loss. Multiple endoscopies and colonoscopies. Patient is symptomatic from her anemia.   # Today hemoglobin is 11.9; MCV- 75 ; saturation 24%% ferritin 230.  Recommend IV Feraheme weekly 1; today  # discussed re: PO iron/ miralax for constipation.    # Follow-up labs IV Feraheme/M.D. in 4 months.; Labs- few days prior.     Cammie Sickle, MD 03/13/2017 11:01 AM

## 2017-03-13 NOTE — Progress Notes (Signed)
Patient here today for follow up.   

## 2017-03-13 NOTE — Assessment & Plan Note (Addendum)
#   Iron deficiency anemia chronic likely chronic GI blood loss. Multiple endoscopies and colonoscopies. Patient is symptomatic from her anemia.   # Today hemoglobin is 11.9; MCV- 75 ; saturation 24%% ferritin 230.  Recommend IV Feraheme weekly 1; today  # discussed re: PO iron/ miralax for constipation.    # Follow-up labs IV Feraheme/M.D. in 4 months.; Labs- few days prior.

## 2017-03-23 DIAGNOSIS — I10 Essential (primary) hypertension: Secondary | ICD-10-CM | POA: Diagnosis not present

## 2017-03-23 DIAGNOSIS — R5381 Other malaise: Secondary | ICD-10-CM | POA: Diagnosis not present

## 2017-03-23 DIAGNOSIS — E784 Other hyperlipidemia: Secondary | ICD-10-CM | POA: Diagnosis not present

## 2017-03-29 DIAGNOSIS — Z96659 Presence of unspecified artificial knee joint: Secondary | ICD-10-CM | POA: Diagnosis not present

## 2017-03-29 DIAGNOSIS — J3081 Allergic rhinitis due to animal (cat) (dog) hair and dander: Secondary | ICD-10-CM | POA: Diagnosis not present

## 2017-03-29 DIAGNOSIS — I208 Other forms of angina pectoris: Secondary | ICD-10-CM | POA: Diagnosis not present

## 2017-03-29 DIAGNOSIS — I493 Ventricular premature depolarization: Secondary | ICD-10-CM | POA: Diagnosis not present

## 2017-04-12 ENCOUNTER — Encounter: Payer: Self-pay | Admitting: *Deleted

## 2017-04-12 ENCOUNTER — Ambulatory Visit
Admission: EM | Admit: 2017-04-12 | Discharge: 2017-04-12 | Disposition: A | Discharge status: Home / Self Care | Payer: Medicare Other | Attending: Emergency Medicine | Admitting: Emergency Medicine

## 2017-04-12 ENCOUNTER — Ambulatory Visit (INDEPENDENT_AMBULATORY_CARE_PROVIDER_SITE_OTHER): Payer: Medicare Other

## 2017-04-12 DIAGNOSIS — R05 Cough: Secondary | ICD-10-CM

## 2017-04-12 DIAGNOSIS — J441 Chronic obstructive pulmonary disease with (acute) exacerbation: Secondary | ICD-10-CM | POA: Diagnosis not present

## 2017-04-12 DIAGNOSIS — R062 Wheezing: Secondary | ICD-10-CM

## 2017-04-12 MED ORDER — PREDNISONE 20 MG PO TABS: 40.0000 mg | ORAL_TABLET | Freq: Every day | ORAL | 0 refills | Status: AC

## 2017-04-12 MED ORDER — AEROCHAMBER PLUS FLO-VU MEDIUM MISC: 1.0000 | Freq: Once | Status: DC

## 2017-04-12 MED ORDER — IPRATROPIUM-ALBUTEROL 0.5-2.5 (3) MG/3ML IN SOLN
3.0000 mL | Freq: Once | RESPIRATORY_TRACT | Status: AC
Start: 2017-04-12 — End: 2017-04-12
  Administered 2017-04-12: 3 mL via RESPIRATORY_TRACT

## 2017-04-12 NOTE — Discharge Instructions (Signed)
2 puffs from your albuterol inhaler using your spacer every 4-6 hours over the next few days. Backoff on your albuterol as you start to feel better. You may continue the Mucinex. Or you could try some Claritin or Zyrtec but do not do both the Mucinex and the Claritin/Zyrtec. Finish the prednisone. Go to the ER for the signs and symptoms we discussed, otherwise follow-up with your primary care physician in several days to make sure that you getting better.

## 2017-04-12 NOTE — ED Triage Notes (Signed)
Patient started having symptoms of cough and body aches yesterday. Patient reports feeling chills and weakness today.

## 2017-04-12 NOTE — ED Provider Notes (Signed)
HPI  SUBJECTIVE:  Daisy Holloway is a 83 y.o. female who presents with a cough productive of whitish phlegm starting last night after being out in the yard, exposed to pollen and grass to which she is allergic. She reports chills today, decreased appetite, decreased energy today. She denies nausea vomiting or fevers. She denies wheezing, chest pain. She denies change in her baseline shortness of breath with exertion. Reports body aches, but on further questioning it appears that she is describing her typical arthritis joint pains. No headaches, sore throat. No diarrhea, abdominal pain. No nasal congestion, rhinorrhea, postnasal drip. No lower extremity edema, orthopnea, nocturia, unintentional weight gain, PND. No dizziness. No new calf pain or swelling, no surgery in the past 4 weeks, no hemoptysis, no prolonged immobilization, exogenous estrogen, history of PE, DVT, cancer. She has been requiring her albuterol inhaler more frequently than usual which she states helps her shortness of breath. Symptoms are worse with going outside. She is also compliant with her Advair, and has tried cough drops, pushing fluids, Mucinex and Tylenol. No antibiotic in the past month. No antipyretic in the past 6-8 hours . She has a past medical history of hypertension, COPD, allergies, pneumonia, angina, mitral valve prolapse/cardiac murmur . No history of CHF, diabetes, smoking, PE, DVT, cancer. states that she was sent in by her daughter make sure that she does not have a pneumonia.  she does not have a spacer at home.  PCP: Cletis Athens, MD   Past Medical History:  Diagnosis Date  . Anemia    IDA  . Arthritis   . Asthma   . GERD (gastroesophageal reflux disease)   . Osteoporosis     Past Surgical History:  Procedure Laterality Date  . ABDOMINAL HYSTERECTOMY  1964   ovaries left in  . BREAST BIOPSY Right 1998   patient states calcifications  . CATARACT EXTRACTION     one eye in 2007 and the other eye  2012  . CHOLECYSTECTOMY    . KNEE SURGERY Right 2004  . KNEE SURGERY Left 2012  . SHOULDER SURGERY  1995    Family History  Problem Relation Age of Onset  . Liver cancer Mother   . Arthritis Mother   . Gastric cancer Father   . Asthma Father   . Diabetes Sister   . Diabetes Brother   . Breast cancer Maternal Aunt   . Breast cancer Cousin     Social History  Substance Use Topics  . Smoking status: Never Smoker  . Smokeless tobacco: Never Used  . Alcohol use Yes     Comment: occ. wine     Current Facility-Administered Medications:  .  AEROCHAMBER PLUS FLO-VU MEDIUM MISC 1 each, 1 each, Other, Once, Melynda Ripple, MD  Current Outpatient Prescriptions:  .  Albuterol Sulfate (PROAIR HFA IN), Inhale into the lungs 4 (four) times daily as needed. 2 puffs, Disp: , Rfl:  .  ALPRAZolam (XANAX) 0.25 MG tablet, Take 0.25 mg by mouth at bedtime. Reported on 06/13/2016, Disp: , Rfl: 1 .  aspirin EC 81 MG tablet, Take 81 mg by mouth daily., Disp: , Rfl:  .  atorvastatin (LIPITOR) 10 MG tablet, Take by mouth., Disp: , Rfl:  .  Cholecalciferol (VITAMIN D3) 2000 UNITS capsule, Take 5,000 Units by mouth daily. , Disp: , Rfl:  .  diltiazem (CARDIZEM CD) 120 MG 24 hr capsule, Take 120 mg by mouth daily., Disp: , Rfl: 4 .  fluticasone (FLONASE) 50 MCG/ACT nasal  spray, Place into the nose., Disp: , Rfl:  .  Fluticasone-Salmeterol (ADVAIR) 250-50 MCG/DOSE AEPB, Inhale 1 puff into the lungs 2 (two) times daily., Disp: , Rfl:  .  Glycerin-Hypromellose-PEG 400 ([facility] DRY EYE RELIEF) 0.2-0.2-1 % SOLN, Apply to eye., Disp: , Rfl:  .  ipratropium-albuterol (DUONEB) 0.5-2.5 (3) MG/3ML SOLN, See admin instructions., Disp: , Rfl: 8 .  loratadine (CLARITIN) 10 MG tablet, Take 10 mg by mouth daily. Reported on 06/13/2016, Disp: , Rfl:  .  Multiple Vitamin (MULTI-VITAMINS) TABS, Take by mouth. Reported on 06/13/2016, Disp: , Rfl:  .  nystatin-triamcinolone (MYCOLOG II) cream, APPLY TO AFFECTED AREA TWICE A  DAY, Disp: , Rfl: 5 .  Omega-3 Fatty Acids (FISH OIL) 1000 MG CPDR, Take by mouth., Disp: , Rfl:  .  sodium chloride (OCEAN) 0.65 % nasal spray, Place into the nose., Disp: , Rfl:  .  vitamin B-12 (CYANOCOBALAMIN) 1000 MCG tablet, Take by mouth., Disp: , Rfl:  .  omeprazole (PRILOSEC) 20 MG capsule, Take 20 mg by mouth daily as needed., Disp: , Rfl:  .  predniSONE (DELTASONE) 20 MG tablet, Take 2 tablets (40 mg total) by mouth daily with breakfast., Disp: 10 tablet, Rfl: 0 .  sucralfate (CARAFATE) 1 G tablet, Take 1 g by mouth 4 (four) times daily., Disp: , Rfl:   Allergies  Allergen Reactions  . Aspirin Nausea Only    Other reaction(s): Distress (finding) Other reaction(s): Distress (finding)  . Sulfa Antibiotics Hives    Other reaction(s): Unknown Other reaction(s): Unknown     ROS  As noted in HPI.   Physical Exam  BP (!) 134/57 (BP Location: Left Arm)   Pulse 76   Temp 98.6 F (37 C) (Oral)   Resp 18   Ht 5\' 4"  (1.626 m)   Wt 162 lb (73.5 kg)   SpO2 96%   BMI 27.81 kg/m   BP Readings from Last 3 Encounters:  04/12/17 (!) 134/57  03/13/17 120/69  02/27/17 121/69   Constitutional: Well developed, well nourished, no acute distress Eyes: PERRL, EOMI, conjunctiva normal bilaterally HENT: Normocephalic, atraumatic,mucus membranes moistAnd no nasal congestion. Normal oropharynx. No postnasal drip  Respiratory: Fair air movement, Clear to auscultation bilaterally, no rales, no wheezing, no rhonchi no chest wall tenderness  Cardiovascular: Mild tachycardia, positive murmur, no gallops, no rubs GI: Soft, nondistended, normal bowel sounds, nontender, no rebound, no guarding skin: No rash, skin intact Musculoskeletal: No edema, no tenderness, no deformities Neurologic: Alert & oriented x 3, CN II-XII grossly intact, no motor deficits, sensation grossly intact Psychiatric: Speech and behavior appropriate   ED Course   Medications  AEROCHAMBER PLUS FLO-VU MEDIUM MISC 1  each (not administered)  ipratropium-albuterol (DUONEB) 0.5-2.5 (3) MG/3ML nebulizer solution 3 mL (3 mLs Nebulization Given 04/12/17 2006)    Orders Placed This Encounter  Procedures  . DG Chest 2 View    Standing Status:   Standing    Number of Occurrences:   1    Order Specific Question:   Reason for Exam (SYMPTOM  OR DIAGNOSIS REQUIRED)    Answer:   cough r/o PNA  . Vital signs    after duoneb    Standing Status:   Standing    Number of Occurrences:   1   No results found for this or any previous visit (from the past 24 hour(s)). Dg Chest 2 View  Result Date: 04/12/2017 CLINICAL DATA:  Cough and body aches. EXAM: CHEST  2 VIEW COMPARISON:  05/11/2014  FINDINGS: Lungs are hyperexpanded. The lungs are clear wiithout focal pneumonia, edema, pneumothorax or pleural effusion. Cardiopericardial silhouette is at upper limits of normal for size. Moderate to large hiatal hernia again noted. The visualized bony structures of the thorax are intact. IMPRESSION: 1. No acute cardiopulmonary findings. 2. Moderate hiatal hernia. Electronically Signed   By: Misty Stanley M.D.   On: 04/12/2017 20:20    ED Clinical Impression  COPD exacerbation [facility])  ED Assessment/Plan  Initial Blood pressure of 96/63 noted. She also mildly tachycardic at 100. Patient states that she had the home health nurse measure her blood pressure last week and it was 102 over 40s/50s. She is tolerating the today's blood pressure very well she denies dizziness, chest pain. She is is drinking a bottle of water here in the clinic. She is otherwise afebrile, has not taken antipyretic in the past 6-8 hours. We will check chest x-ray to rule out pneumonia give her DuoNeb, repeat her blood pressure. She is satting well on room air. Doubt PE, DVT.  Reviewed imaging independently. Moderate to large Hiatal hernia. No pneumonia, edema, pneumothorax or pleural effusion. See radiology report for details.  Reevaluation, patient states that  she feels better. She has wheezing with forced exhalation but significantly improved air movement. Blood pressure improved, she is satting 96% on room air heart rate has decreased to 77. Presentation most consistent with a COPD exacerbation. Discussed with her that radiographic findings sometimes lag behind physical exam findings, but I do not think she has a pneumonia, at least not this point in time. Plan to send home with a spacer-gave patient one here, and a prescription for prednisone 40 mg for 5 days. She has plenty of albuterol at home and states that she does not need a prescription. She will follow up with Dr. Rebecka Apley in 1-3 days. She will go to the ER if she gets worse.  Discussed imaging, MDM, plan and followup with patient. Discussed sn/sx that should prompt return to the ED. Patient agrees with plan.   Meds ordered this encounter  Medications  . ipratropium-albuterol (DUONEB) 0.5-2.5 (3) MG/3ML nebulizer solution 3 mL  . predniSONE (DELTASONE) 20 MG tablet    Sig: Take 2 tablets (40 mg total) by mouth daily with breakfast.    Dispense:  10 tablet    Refill:  0  . AEROCHAMBER PLUS FLO-VU MEDIUM MISC 1 each    *This clinic note was created using Dragon dictation software. Therefore, there may be occasional mistakes despite careful proofreading.  ?   Melynda Ripple, MD 04/12/17 2046

## 2017-04-17 DIAGNOSIS — J441 Chronic obstructive pulmonary disease with (acute) exacerbation: Secondary | ICD-10-CM | POA: Diagnosis not present

## 2017-04-17 DIAGNOSIS — J189 Pneumonia, unspecified organism: Secondary | ICD-10-CM | POA: Diagnosis not present

## 2017-05-29 DIAGNOSIS — D649 Anemia, unspecified: Secondary | ICD-10-CM | POA: Diagnosis not present

## 2017-05-29 DIAGNOSIS — I32 Pericarditis in diseases classified elsewhere: Secondary | ICD-10-CM | POA: Diagnosis not present

## 2017-05-29 DIAGNOSIS — R0489 Hemorrhage from other sites in respiratory passages: Secondary | ICD-10-CM | POA: Diagnosis not present

## 2017-05-29 DIAGNOSIS — I498 Other specified cardiac arrhythmias: Secondary | ICD-10-CM | POA: Diagnosis not present

## 2017-07-12 ENCOUNTER — Inpatient Hospital Stay: Payer: Medicare Other | Attending: Internal Medicine

## 2017-07-12 DIAGNOSIS — K219 Gastro-esophageal reflux disease without esophagitis: Secondary | ICD-10-CM | POA: Diagnosis not present

## 2017-07-12 DIAGNOSIS — Z7982 Long term (current) use of aspirin: Secondary | ICD-10-CM | POA: Insufficient documentation

## 2017-07-12 DIAGNOSIS — J45909 Unspecified asthma, uncomplicated: Secondary | ICD-10-CM | POA: Diagnosis not present

## 2017-07-12 DIAGNOSIS — J449 Chronic obstructive pulmonary disease, unspecified: Secondary | ICD-10-CM | POA: Insufficient documentation

## 2017-07-12 DIAGNOSIS — Z800 Family history of malignant neoplasm of digestive organs: Secondary | ICD-10-CM | POA: Diagnosis not present

## 2017-07-12 DIAGNOSIS — Z803 Family history of malignant neoplasm of breast: Secondary | ICD-10-CM | POA: Insufficient documentation

## 2017-07-12 DIAGNOSIS — K5900 Constipation, unspecified: Secondary | ICD-10-CM | POA: Insufficient documentation

## 2017-07-12 DIAGNOSIS — M810 Age-related osteoporosis without current pathological fracture: Secondary | ICD-10-CM | POA: Diagnosis not present

## 2017-07-12 DIAGNOSIS — R5383 Other fatigue: Secondary | ICD-10-CM | POA: Diagnosis not present

## 2017-07-12 DIAGNOSIS — D649 Anemia, unspecified: Secondary | ICD-10-CM | POA: Insufficient documentation

## 2017-07-12 DIAGNOSIS — D500 Iron deficiency anemia secondary to blood loss (chronic): Secondary | ICD-10-CM

## 2017-07-12 DIAGNOSIS — D509 Iron deficiency anemia, unspecified: Secondary | ICD-10-CM | POA: Diagnosis not present

## 2017-07-12 DIAGNOSIS — Z79899 Other long term (current) drug therapy: Secondary | ICD-10-CM | POA: Insufficient documentation

## 2017-07-12 LAB — COMPREHENSIVE METABOLIC PANEL
ALT: 15 U/L (ref 14–54)
AST: 22 U/L (ref 15–41)
Albumin: 4 g/dL (ref 3.5–5.0)
Alkaline Phosphatase: 90 U/L (ref 38–126)
Anion gap: 7 (ref 5–15)
BILIRUBIN TOTAL: 0.6 mg/dL (ref 0.3–1.2)
BUN: 14 mg/dL (ref 6–20)
CALCIUM: 9.2 mg/dL (ref 8.9–10.3)
CO2: 27 mmol/L (ref 22–32)
Chloride: 104 mmol/L (ref 101–111)
Creatinine, Ser: 0.79 mg/dL (ref 0.44–1.00)
Glucose, Bld: 104 mg/dL — ABNORMAL HIGH (ref 65–99)
Potassium: 4.4 mmol/L (ref 3.5–5.1)
Sodium: 138 mmol/L (ref 135–145)
TOTAL PROTEIN: 6.9 g/dL (ref 6.5–8.1)

## 2017-07-12 LAB — IRON AND TIBC
Iron: 45 ug/dL (ref 28–170)
Saturation Ratios: 14 % (ref 10.4–31.8)
TIBC: 331 ug/dL (ref 250–450)
UIBC: 286 ug/dL

## 2017-07-12 LAB — CBC WITH DIFFERENTIAL/PLATELET
BASOS ABS: 0 10*3/uL (ref 0–0.1)
Basophils Relative: 1 %
EOS PCT: 7 %
Eosinophils Absolute: 0.3 10*3/uL (ref 0–0.7)
HEMATOCRIT: 38.7 % (ref 35.0–47.0)
Hemoglobin: 12.4 g/dL (ref 12.0–16.0)
LYMPHS ABS: 1.1 10*3/uL (ref 1.0–3.6)
LYMPHS PCT: 26 %
MCH: 25.9 pg — AB (ref 26.0–34.0)
MCHC: 32.1 g/dL (ref 32.0–36.0)
MCV: 80.7 fL (ref 80.0–100.0)
Monocytes Absolute: 0.4 10*3/uL (ref 0.2–0.9)
Monocytes Relative: 9 %
NEUTROS PCT: 57 %
Neutro Abs: 2.5 10*3/uL (ref 1.4–6.5)
Platelets: 158 10*3/uL (ref 150–440)
RBC: 4.79 MIL/uL (ref 3.80–5.20)
RDW: 16.8 % — ABNORMAL HIGH (ref 11.5–14.5)
WBC: 4.4 10*3/uL (ref 3.6–11.0)

## 2017-07-12 LAB — FERRITIN: FERRITIN: 20 ng/mL (ref 11–307)

## 2017-07-17 ENCOUNTER — Inpatient Hospital Stay (HOSPITAL_BASED_OUTPATIENT_CLINIC_OR_DEPARTMENT_OTHER): Payer: Medicare Other | Admitting: Internal Medicine

## 2017-07-17 ENCOUNTER — Encounter: Payer: Self-pay | Admitting: Internal Medicine

## 2017-07-17 ENCOUNTER — Inpatient Hospital Stay: Payer: Medicare Other

## 2017-07-17 VITALS — BP 121/72 | HR 71 | Temp 97.6°F | Resp 18 | Ht 64.0 in | Wt 165.0 lb

## 2017-07-17 DIAGNOSIS — Z800 Family history of malignant neoplasm of digestive organs: Secondary | ICD-10-CM

## 2017-07-17 DIAGNOSIS — M810 Age-related osteoporosis without current pathological fracture: Secondary | ICD-10-CM | POA: Diagnosis not present

## 2017-07-17 DIAGNOSIS — J449 Chronic obstructive pulmonary disease, unspecified: Secondary | ICD-10-CM

## 2017-07-17 DIAGNOSIS — D509 Iron deficiency anemia, unspecified: Secondary | ICD-10-CM

## 2017-07-17 DIAGNOSIS — K219 Gastro-esophageal reflux disease without esophagitis: Secondary | ICD-10-CM | POA: Diagnosis not present

## 2017-07-17 DIAGNOSIS — Z79899 Other long term (current) drug therapy: Secondary | ICD-10-CM | POA: Diagnosis not present

## 2017-07-17 DIAGNOSIS — R5383 Other fatigue: Secondary | ICD-10-CM

## 2017-07-17 DIAGNOSIS — D500 Iron deficiency anemia secondary to blood loss (chronic): Secondary | ICD-10-CM

## 2017-07-17 DIAGNOSIS — K5900 Constipation, unspecified: Secondary | ICD-10-CM | POA: Diagnosis not present

## 2017-07-17 DIAGNOSIS — Z803 Family history of malignant neoplasm of breast: Secondary | ICD-10-CM | POA: Diagnosis not present

## 2017-07-17 DIAGNOSIS — D649 Anemia, unspecified: Secondary | ICD-10-CM | POA: Diagnosis not present

## 2017-07-17 DIAGNOSIS — J45909 Unspecified asthma, uncomplicated: Secondary | ICD-10-CM | POA: Diagnosis not present

## 2017-07-17 DIAGNOSIS — Z7982 Long term (current) use of aspirin: Secondary | ICD-10-CM

## 2017-07-17 MED ORDER — FERUMOXYTOL INJECTION 510 MG/17 ML
INTRAVENOUS | Status: AC
  Filled 2017-07-17: qty 17

## 2017-07-17 MED ORDER — SODIUM CHLORIDE 0.9 % IV SOLN
510.0000 mg | Freq: Once | INTRAVENOUS | Status: AC
  Administered 2017-07-17: 510 mg via INTRAVENOUS
  Filled 2017-07-17: qty 17

## 2017-07-17 MED ORDER — SODIUM CHLORIDE 0.9 % IV SOLN
Freq: Once | INTRAVENOUS | Status: AC
  Administered 2017-07-17: 20 mL via INTRAVENOUS
  Filled 2017-07-17: qty 1000

## 2017-07-17 NOTE — Progress Notes (Signed)
[place] OFFICE PROGRESS NOTE  Patient Care Team: Cletis Athens, MD as PCP - General (Internal Medicine)   SUMMARY OF ONCOLOGIC HISTORY:  # 2009- CHRONIC IRON DEFICIENCY ANEMIA sec to GI loss [EGD Dr.Elliot Nov 2015; EGD/colo- 2013-Dr.Iftikar ]; Poor PO tolerance. On IV ferraheam q 3-4 months  # 2017- COPD [never smoker]  INTERVAL HISTORY:  83 -year-old pleasant female patient with above history of chronic iron deficiency since 2009 status post multiple endoscopies including her last EGD by Dr. Vira Agar in November 2015 is her for follow-up. Patient has been needing IV iron every 3-4 months.  Patient last received IV iron approximately 4 months ago.. She has not been on oral iron because of constipation. She currently starts to feeling fatigued in the last 1-2 months.  No nausea no vomiting. She denies any weight loss. Denies any difficulty swallowing. No constipation or diarrhea. No blood in stools or black stools.  REVIEW OF SYSTEMS:  A complete 10 point review of system is done which is negative except mentioned above/history of present illness.   PAST MEDICAL HISTORY :  Past Medical History:  Diagnosis Date  . Anemia    IDA  . Arthritis   . Asthma   . GERD (gastroesophageal reflux disease)   . Osteoporosis     PAST SURGICAL HISTORY :   Past Surgical History:  Procedure Laterality Date  . ABDOMINAL HYSTERECTOMY  1964   ovaries left in  . BREAST BIOPSY Right 1998   patient states calcifications  . CATARACT EXTRACTION     one eye in 2007 and the other eye 2012  . CHOLECYSTECTOMY    . KNEE SURGERY Right 2004  . KNEE SURGERY Left 2012  . SHOULDER SURGERY  1995    FAMILY HISTORY :   Family History  Problem Relation Age of Onset  . Liver cancer Mother   . Arthritis Mother   . Gastric cancer Father   . Asthma Father   . Diabetes Sister   . Diabetes Brother   . Breast cancer Maternal Aunt   . Breast cancer Cousin     SOCIAL HISTORY:   Social  History  Substance Use Topics  . Smoking status: Never Smoker  . Smokeless tobacco: Never Used  . Alcohol use Yes     Comment: occ. wine    ALLERGIES:  is allergic to aspirin and sulfa antibiotics.  MEDICATIONS:  Current Outpatient Prescriptions  Medication Sig Dispense Refill  . Albuterol Sulfate (PROAIR HFA IN) Inhale into the lungs 4 (four) times daily as needed. 2 puffs    . atorvastatin (LIPITOR) 10 MG tablet Take by mouth.    . Cholecalciferol (VITAMIN D3) 2000 UNITS capsule Take 5,000 Units by mouth daily.     Marland Kitchen diltiazem (CARDIZEM CD) 120 MG 24 hr capsule Take 120 mg by mouth daily.  4  . fluticasone (FLONASE) 50 MCG/ACT nasal spray Place into the nose.    Marland Kitchen Fluticasone-Salmeterol (ADVAIR) 250-50 MCG/DOSE AEPB Inhale 1 puff into the lungs 2 (two) times daily.    . Multiple Vitamin (MULTI-VITAMINS) TABS Take by mouth. Reported on 06/13/2016    . Omega-3 Fatty Acids (FISH OIL) 1000 MG CPDR Take by mouth.    . Umeclidinium-Vilanterol (ANORO ELLIPTA IN) Inhale 1 puff into the lungs daily.    Marland Kitchen ALPRAZolam (XANAX) 0.25 MG tablet Take 0.25 mg by mouth at bedtime. Reported on 06/13/2016  1  . aspirin EC 81 MG tablet Take 81 mg by mouth daily.    Marland Kitchen  Glycerin-Hypromellose-PEG 400 ([facility] DRY EYE RELIEF) 0.2-0.2-1 % SOLN Apply to eye.    Marland Kitchen ipratropium-albuterol (DUONEB) 0.5-2.5 (3) MG/3ML SOLN See admin instructions.  8  . loratadine (CLARITIN) 10 MG tablet Take 10 mg by mouth daily. Reported on 06/13/2016    . nystatin-triamcinolone (MYCOLOG II) cream APPLY TO AFFECTED AREA TWICE A DAY  5  . omeprazole (PRILOSEC) 20 MG capsule Take 20 mg by mouth daily as needed.    . sodium chloride (OCEAN) 0.65 % nasal spray Place into the nose.    . sucralfate (CARAFATE) 1 G tablet Take 1 g by mouth 4 (four) times daily.    . vitamin B-12 (CYANOCOBALAMIN) 1000 MCG tablet Take by mouth.     No current facility-administered medications for this visit.     PHYSICAL EXAMINATION:   BP 121/72 (BP Location:  Left Arm, Patient Position: Sitting)   Pulse 71   Temp 97.6 F (36.4 C) (Tympanic)   Resp 18   Ht 5\' 4"  (1.626 m)   Wt 165 lb (74.8 kg)   BMI 28.32 kg/m   Filed Weights   07/17/17 0936  Weight: 165 lb (74.8 kg)    GENERAL: Well-nourished well-developed; Alert, no distress and comfortable.She is here alone today EYES: positive for pallor.  OROPHARYNX: no thrush or ulceration NECK: supple, no masses felt LYMPH:  no palpable lymphadenopathy in the cervical, axillary or inguinal regions LUNGS: clear to auscultation and  No wheeze or crackles HEART/CVS: regular rate & rhythm and no murmurs; No lower extremity edema ABDOMEN:abdomen soft, non-tender and normal bowel sounds Musculoskeletal:no cyanosis of digits and no clubbing  PSYCH: alert & oriented x 3 with fluent speech NEURO: no focal motor/sensory deficits SKIN:  no rashes or significant lesions  LABORATORY DATA:  I have reviewed the data as listed    Component Value Date/Time   NA 138 07/12/2017 1008   K 4.4 07/12/2017 1008   CL 104 07/12/2017 1008   CO2 27 07/12/2017 1008   GLUCOSE 104 (H) 07/12/2017 1008   BUN 14 07/12/2017 1008   CREATININE 0.79 07/12/2017 1008   CREATININE 0.98 02/19/2014 0902   CALCIUM 9.2 07/12/2017 1008   PROT 6.9 07/12/2017 1008   PROT 6.5 02/19/2014 0902   ALBUMIN 4.0 07/12/2017 1008   ALBUMIN 3.2 (L) 02/19/2014 0902   AST 22 07/12/2017 1008   AST 18 02/19/2014 0902   ALT 15 07/12/2017 1008   ALT 22 02/19/2014 0902   ALKPHOS 90 07/12/2017 1008   ALKPHOS 95 02/19/2014 0902   BILITOT 0.6 07/12/2017 1008   BILITOT 0.4 02/19/2014 0902   GFRNONAA >60 07/12/2017 1008   GFRNONAA 54 (L) 02/19/2014 0902   GFRAA >60 07/12/2017 1008   GFRAA >60 02/19/2014 0902    No results found for: SPEP, UPEP  Lab Results  Component Value Date   WBC 4.4 07/12/2017   NEUTROABS 2.5 07/12/2017   HGB 12.4 07/12/2017   HCT 38.7 07/12/2017   MCV 80.7 07/12/2017   PLT 158 07/12/2017      Chemistry       Component Value Date/Time   NA 138 07/12/2017 1008   K 4.4 07/12/2017 1008   CL 104 07/12/2017 1008   CO2 27 07/12/2017 1008   BUN 14 07/12/2017 1008   CREATININE 0.79 07/12/2017 1008   CREATININE 0.98 02/19/2014 0902      Component Value Date/Time   CALCIUM 9.2 07/12/2017 1008   ALKPHOS 90 07/12/2017 1008   ALKPHOS 95 02/19/2014 0902  AST 22 07/12/2017 1008   AST 18 02/19/2014 0902   ALT 15 07/12/2017 1008   ALT 22 02/19/2014 0902   BILITOT 0.6 07/12/2017 1008   BILITOT 0.4 02/19/2014 0902       ASSESSMENT & PLAN:  Iron deficiency anemia due to chronic blood loss # Iron deficiency anemia chronic likely chronic GI blood loss/mal. Multiple endoscopies and colonoscopies. Patient is symptomatic from her anemia. Patient's symptoms improved post IV Feraheme.  # Today hemoglobin is 12.4; MCV- 75 ; saturation 14% ferritin 20.  Recommend IV Feraheme today.    # Follow-up labs IV Feraheme/M.D. in 4 months.; Labs- few days prior.  Ferrhem today.     Cammie Sickle, MD 07/17/2017 9:18 PM

## 2017-07-17 NOTE — Assessment & Plan Note (Addendum)
#   Iron deficiency anemia chronic likely chronic GI blood loss/mal. Multiple endoscopies and colonoscopies. Patient is symptomatic from her anemia. Patient's symptoms improved post IV Feraheme.  # Today hemoglobin is 12.4; MCV- 75 ; saturation 14% ferritin 20.  Recommend IV Feraheme today.    # Follow-up labs IV Feraheme/M.D. in 4 months.; Labs- few days prior.  Ferrhem today.

## 2017-07-30 DIAGNOSIS — R0602 Shortness of breath: Secondary | ICD-10-CM | POA: Diagnosis not present

## 2017-07-30 DIAGNOSIS — Z96659 Presence of unspecified artificial knee joint: Secondary | ICD-10-CM | POA: Diagnosis not present

## 2017-07-30 DIAGNOSIS — R062 Wheezing: Secondary | ICD-10-CM | POA: Diagnosis not present

## 2017-07-30 DIAGNOSIS — I350 Nonrheumatic aortic (valve) stenosis: Secondary | ICD-10-CM | POA: Diagnosis not present

## 2017-10-15 ENCOUNTER — Other Ambulatory Visit: Payer: Self-pay | Admitting: Cardiology

## 2017-10-15 DIAGNOSIS — Z1231 Encounter for screening mammogram for malignant neoplasm of breast: Secondary | ICD-10-CM

## 2017-11-06 ENCOUNTER — Ambulatory Visit
Admission: RE | Admit: 2017-11-06 | Discharge: 2017-11-06 | Disposition: A | Discharge status: Home / Self Care | Payer: Medicare Other | Source: Ambulatory Visit | Attending: Cardiology | Admitting: Cardiology

## 2017-11-06 DIAGNOSIS — Z1231 Encounter for screening mammogram for malignant neoplasm of breast: Secondary | ICD-10-CM

## 2017-11-16 ENCOUNTER — Inpatient Hospital Stay: Payer: Medicare Other | Attending: Internal Medicine

## 2017-11-16 DIAGNOSIS — K219 Gastro-esophageal reflux disease without esophagitis: Secondary | ICD-10-CM | POA: Diagnosis not present

## 2017-11-16 DIAGNOSIS — J449 Chronic obstructive pulmonary disease, unspecified: Secondary | ICD-10-CM | POA: Insufficient documentation

## 2017-11-16 DIAGNOSIS — D500 Iron deficiency anemia secondary to blood loss (chronic): Secondary | ICD-10-CM

## 2017-11-16 DIAGNOSIS — K922 Gastrointestinal hemorrhage, unspecified: Secondary | ICD-10-CM | POA: Insufficient documentation

## 2017-11-16 DIAGNOSIS — Z7982 Long term (current) use of aspirin: Secondary | ICD-10-CM | POA: Diagnosis not present

## 2017-11-16 DIAGNOSIS — R5383 Other fatigue: Secondary | ICD-10-CM | POA: Insufficient documentation

## 2017-11-16 DIAGNOSIS — Z9049 Acquired absence of other specified parts of digestive tract: Secondary | ICD-10-CM | POA: Insufficient documentation

## 2017-11-16 DIAGNOSIS — Z79899 Other long term (current) drug therapy: Secondary | ICD-10-CM | POA: Diagnosis not present

## 2017-11-16 DIAGNOSIS — Z90710 Acquired absence of both cervix and uterus: Secondary | ICD-10-CM | POA: Insufficient documentation

## 2017-11-16 DIAGNOSIS — Z7951 Long term (current) use of inhaled steroids: Secondary | ICD-10-CM | POA: Diagnosis not present

## 2017-11-16 DIAGNOSIS — J45909 Unspecified asthma, uncomplicated: Secondary | ICD-10-CM | POA: Diagnosis not present

## 2017-11-16 DIAGNOSIS — Z800 Family history of malignant neoplasm of digestive organs: Secondary | ICD-10-CM | POA: Diagnosis not present

## 2017-11-16 DIAGNOSIS — M129 Arthropathy, unspecified: Secondary | ICD-10-CM | POA: Insufficient documentation

## 2017-11-16 DIAGNOSIS — Z803 Family history of malignant neoplasm of breast: Secondary | ICD-10-CM | POA: Diagnosis not present

## 2017-11-16 DIAGNOSIS — M810 Age-related osteoporosis without current pathological fracture: Secondary | ICD-10-CM | POA: Diagnosis not present

## 2017-11-16 LAB — CBC WITH DIFFERENTIAL/PLATELET
BASOS PCT: 1 %
Basophils Absolute: 0 10*3/uL (ref 0–0.1)
EOS ABS: 0.2 10*3/uL (ref 0–0.7)
Eosinophils Relative: 4 %
HCT: 42.8 % (ref 35.0–47.0)
HEMOGLOBIN: 14 g/dL (ref 12.0–16.0)
Lymphocytes Relative: 19 %
Lymphs Abs: 1.1 10*3/uL (ref 1.0–3.6)
MCH: 27.6 pg (ref 26.0–34.0)
MCHC: 32.6 g/dL (ref 32.0–36.0)
MCV: 84.5 fL (ref 80.0–100.0)
MONOS PCT: 8 %
Monocytes Absolute: 0.5 10*3/uL (ref 0.2–0.9)
NEUTROS ABS: 4 10*3/uL (ref 1.4–6.5)
NEUTROS PCT: 68 %
Platelets: 172 10*3/uL (ref 150–440)
RBC: 5.07 MIL/uL (ref 3.80–5.20)
RDW: 15.1 % — ABNORMAL HIGH (ref 11.5–14.5)
WBC: 5.8 10*3/uL (ref 3.6–11.0)

## 2017-11-16 LAB — IRON AND TIBC
IRON: 59 ug/dL (ref 28–170)
Saturation Ratios: 19 % (ref 10.4–31.8)
TIBC: 309 ug/dL (ref 250–450)
UIBC: 250 ug/dL

## 2017-11-16 LAB — COMPREHENSIVE METABOLIC PANEL
ALK PHOS: 95 U/L (ref 38–126)
ALT: 16 U/L (ref 14–54)
ANION GAP: 9 (ref 5–15)
AST: 22 U/L (ref 15–41)
Albumin: 4.1 g/dL (ref 3.5–5.0)
BUN: 16 mg/dL (ref 6–20)
CALCIUM: 9.2 mg/dL (ref 8.9–10.3)
CHLORIDE: 105 mmol/L (ref 101–111)
CO2: 24 mmol/L (ref 22–32)
CREATININE: 0.8 mg/dL (ref 0.44–1.00)
Glucose, Bld: 110 mg/dL — ABNORMAL HIGH (ref 65–99)
Potassium: 4.3 mmol/L (ref 3.5–5.1)
SODIUM: 138 mmol/L (ref 135–145)
Total Bilirubin: 0.6 mg/dL (ref 0.3–1.2)
Total Protein: 7.7 g/dL (ref 6.5–8.1)

## 2017-11-16 LAB — FERRITIN: FERRITIN: 47 ng/mL (ref 11–307)

## 2017-11-20 ENCOUNTER — Inpatient Hospital Stay: Payer: Medicare Other

## 2017-11-20 ENCOUNTER — Inpatient Hospital Stay (HOSPITAL_BASED_OUTPATIENT_CLINIC_OR_DEPARTMENT_OTHER): Payer: Medicare Other | Admitting: Internal Medicine

## 2017-11-20 ENCOUNTER — Other Ambulatory Visit: Payer: Self-pay

## 2017-11-20 VITALS — BP 127/54 | HR 72 | Temp 98.9°F | Resp 18 | Wt 165.5 lb

## 2017-11-20 VITALS — BP 127/70 | HR 66 | Resp 18

## 2017-11-20 DIAGNOSIS — M810 Age-related osteoporosis without current pathological fracture: Secondary | ICD-10-CM | POA: Diagnosis not present

## 2017-11-20 DIAGNOSIS — J449 Chronic obstructive pulmonary disease, unspecified: Secondary | ICD-10-CM | POA: Diagnosis not present

## 2017-11-20 DIAGNOSIS — Z79899 Other long term (current) drug therapy: Secondary | ICD-10-CM | POA: Diagnosis not present

## 2017-11-20 DIAGNOSIS — J45909 Unspecified asthma, uncomplicated: Secondary | ICD-10-CM

## 2017-11-20 DIAGNOSIS — K219 Gastro-esophageal reflux disease without esophagitis: Secondary | ICD-10-CM | POA: Diagnosis not present

## 2017-11-20 DIAGNOSIS — Z7982 Long term (current) use of aspirin: Secondary | ICD-10-CM | POA: Diagnosis not present

## 2017-11-20 DIAGNOSIS — D500 Iron deficiency anemia secondary to blood loss (chronic): Secondary | ICD-10-CM

## 2017-11-20 DIAGNOSIS — M129 Arthropathy, unspecified: Secondary | ICD-10-CM | POA: Diagnosis not present

## 2017-11-20 DIAGNOSIS — K922 Gastrointestinal hemorrhage, unspecified: Secondary | ICD-10-CM | POA: Diagnosis not present

## 2017-11-20 DIAGNOSIS — Z9049 Acquired absence of other specified parts of digestive tract: Secondary | ICD-10-CM

## 2017-11-20 DIAGNOSIS — Z90710 Acquired absence of both cervix and uterus: Secondary | ICD-10-CM

## 2017-11-20 DIAGNOSIS — Z7951 Long term (current) use of inhaled steroids: Secondary | ICD-10-CM | POA: Diagnosis not present

## 2017-11-20 DIAGNOSIS — D509 Iron deficiency anemia, unspecified: Secondary | ICD-10-CM

## 2017-11-20 DIAGNOSIS — Z800 Family history of malignant neoplasm of digestive organs: Secondary | ICD-10-CM | POA: Diagnosis not present

## 2017-11-20 DIAGNOSIS — Z803 Family history of malignant neoplasm of breast: Secondary | ICD-10-CM | POA: Diagnosis not present

## 2017-11-20 DIAGNOSIS — R5383 Other fatigue: Secondary | ICD-10-CM | POA: Diagnosis not present

## 2017-11-20 MED ORDER — FERUMOXYTOL INJECTION 510 MG/17 ML
INTRAVENOUS | Status: AC
  Filled 2017-11-20: qty 17

## 2017-11-20 MED ORDER — SODIUM CHLORIDE 0.9 % IV SOLN
Freq: Once | INTRAVENOUS | Status: AC
  Administered 2017-11-20: 14:00:00 via INTRAVENOUS
  Filled 2017-11-20: qty 1000

## 2017-11-20 MED ORDER — SODIUM CHLORIDE 0.9 % IV SOLN
510.0000 mg | Freq: Once | INTRAVENOUS | Status: AC
  Administered 2017-11-20: 510 mg via INTRAVENOUS
  Filled 2017-11-20: qty 17

## 2017-11-20 NOTE — Assessment & Plan Note (Signed)
#   Iron deficiency anemia chronic likely chronic GI blood loss/mal. Multiple endoscopies and colonoscopies.   # Today hemoglobin is 14 ; saturation 18% ferritin 47;-patient is tired; she feels significantly better after IV iron infusion.  Recommend IV Feraheme today.    # Follow-up labs IV Feraheme/M.D. in 6 months.; Labs- few days prior.  Ferrhem today.

## 2017-11-20 NOTE — Progress Notes (Signed)
[place] OFFICE PROGRESS NOTE  Patient Care Team: Cletis Athens, MD as PCP - General (Internal Medicine)   SUMMARY OF ONCOLOGIC HISTORY:  # 2009- CHRONIC IRON DEFICIENCY ANEMIA sec to GI loss [EGD Dr.Elliot Nov 2015; EGD/colo- 2013-Dr.Iftikar ]; Poor PO tolerance. On IV ferraheam q 3-4 months  # 2017- COPD [never smoker]  INTERVAL HISTORY:  83 -year-old pleasant female patient with above history of chronic iron deficiency since 2009 status post multiple endoscopies including her last EGD by Dr. Vira Agar in November 2015 is her for follow-up.   Patient last received IV iron approximately 4 months ago-in August 2018.  She is starting to feel tired in the last few months.  She has been her husband's primary caregiver.  No nausea no vomiting.  No blood in stools or black colored stools.  She denies any weight loss. Denies any difficulty swallowing.  REVIEW OF SYSTEMS:  A complete 10 point review of system is done which is negative except mentioned above/history of present illness.   PAST MEDICAL HISTORY :  Past Medical History:  Diagnosis Date  . Anemia    IDA  . Arthritis   . Asthma   . GERD (gastroesophageal reflux disease)   . Osteoporosis     PAST SURGICAL HISTORY :   Past Surgical History:  Procedure Laterality Date  . ABDOMINAL HYSTERECTOMY  1964   ovaries left in  . BREAST BIOPSY Right 1998   patient states calcifications  . CATARACT EXTRACTION     one eye in 2007 and the other eye 2012  . CHOLECYSTECTOMY    . KNEE SURGERY Right 2004  . KNEE SURGERY Left 2012  . SHOULDER SURGERY  1995    FAMILY HISTORY :   Family History  Problem Relation Age of Onset  . Liver cancer Mother   . Arthritis Mother   . Gastric cancer Father   . Asthma Father   . Diabetes Sister   . Diabetes Brother   . Breast cancer Maternal Aunt   . Breast cancer Cousin     SOCIAL HISTORY:   Social History   Tobacco Use  . Smoking status: Never Smoker  . Smokeless  tobacco: Never Used  Substance Use Topics  . Alcohol use: Yes    Comment: occ. wine  . Drug use: No    ALLERGIES:  is allergic to aspirin and sulfa antibiotics.  MEDICATIONS:  Current Outpatient Medications  Medication Sig Dispense Refill  . Albuterol Sulfate (PROAIR HFA IN) Inhale into the lungs 4 (four) times daily as needed. 2 puffs    . aspirin EC 81 MG tablet Take 81 mg by mouth daily.    Marland Kitchen atorvastatin (LIPITOR) 10 MG tablet Take by mouth.    . Cholecalciferol (VITAMIN D3) 2000 UNITS capsule Take 5,000 Units by mouth daily.     Marland Kitchen diltiazem (CARDIZEM CD) 120 MG 24 hr capsule Take 120 mg by mouth daily.  4  . fluticasone (FLONASE) 50 MCG/ACT nasal spray Place into the nose.    Marland Kitchen Fluticasone-Salmeterol (ADVAIR) 250-50 MCG/DOSE AEPB Inhale 1 puff into the lungs 2 (two) times daily.    Marland Kitchen Umeclidinium-Vilanterol (ANORO ELLIPTA IN) Inhale 1 puff into the lungs daily.    . Glycerin-Hypromellose-PEG 400 ([facility] DRY EYE RELIEF) 0.2-0.2-1 % SOLN Apply to eye.    Marland Kitchen ipratropium-albuterol (DUONEB) 0.5-2.5 (3) MG/3ML SOLN See admin instructions.  8  . loratadine (CLARITIN) 10 MG tablet Take 10 mg by mouth daily. Reported on 06/13/2016    .  Multiple Vitamin (MULTI-VITAMINS) TABS Take by mouth. Reported on 06/13/2016    . nystatin-triamcinolone (MYCOLOG II) cream APPLY TO AFFECTED AREA TWICE A DAY  5  . Omega-3 Fatty Acids (FISH OIL) 1000 MG CPDR Take by mouth.    Marland Kitchen omeprazole (PRILOSEC) 20 MG capsule Take 20 mg by mouth daily as needed.    . sodium chloride (OCEAN) 0.65 % nasal spray Place into the nose.    . vitamin B-12 (CYANOCOBALAMIN) 1000 MCG tablet Take by mouth.     No current facility-administered medications for this visit.     PHYSICAL EXAMINATION:   BP (!) 127/54   Pulse 72   Temp 98.9 F (37.2 C) (Oral)   Resp 18   Wt 165 lb 8 oz (75.1 kg)   BMI 28.41 kg/m   Filed Weights   11/20/17 1352  Weight: 165 lb 8 oz (75.1 kg)    GENERAL: Well-nourished well-developed; Alert, no  distress and comfortable.She is here alone today EYES: positive for pallor.  OROPHARYNX: no thrush or ulceration NECK: supple, no masses felt LYMPH:  no palpable lymphadenopathy in the cervical, axillary or inguinal regions LUNGS: clear to auscultation and  No wheeze or crackles HEART/CVS: regular rate & rhythm and positive for murmurs; No lower extremity edema ABDOMEN:abdomen soft, non-tender and normal bowel sounds Musculoskeletal:no cyanosis of digits and no clubbing  PSYCH: alert & oriented x 3 with fluent speech NEURO: no focal motor/sensory deficits SKIN:  no rashes or significant lesions  LABORATORY DATA:  I have reviewed the data as listed    Component Value Date/Time   NA 138 11/16/2017 1334   K 4.3 11/16/2017 1334   CL 105 11/16/2017 1334   CO2 24 11/16/2017 1334   GLUCOSE 110 (H) 11/16/2017 1334   BUN 16 11/16/2017 1334   CREATININE 0.80 11/16/2017 1334   CREATININE 0.98 02/19/2014 0902   CALCIUM 9.2 11/16/2017 1334   PROT 7.7 11/16/2017 1334   PROT 6.5 02/19/2014 0902   ALBUMIN 4.1 11/16/2017 1334   ALBUMIN 3.2 (L) 02/19/2014 0902   AST 22 11/16/2017 1334   AST 18 02/19/2014 0902   ALT 16 11/16/2017 1334   ALT 22 02/19/2014 0902   ALKPHOS 95 11/16/2017 1334   ALKPHOS 95 02/19/2014 0902   BILITOT 0.6 11/16/2017 1334   BILITOT 0.4 02/19/2014 0902   GFRNONAA >60 11/16/2017 1334   GFRNONAA 54 (L) 02/19/2014 0902   GFRAA >60 11/16/2017 1334   GFRAA >60 02/19/2014 0902    No results found for: SPEP, UPEP  Lab Results  Component Value Date   WBC 5.8 11/16/2017   NEUTROABS 4.0 11/16/2017   HGB 14.0 11/16/2017   HCT 42.8 11/16/2017   MCV 84.5 11/16/2017   PLT 172 11/16/2017      Chemistry      Component Value Date/Time   NA 138 11/16/2017 1334   K 4.3 11/16/2017 1334   CL 105 11/16/2017 1334   CO2 24 11/16/2017 1334   BUN 16 11/16/2017 1334   CREATININE 0.80 11/16/2017 1334   CREATININE 0.98 02/19/2014 0902      Component Value Date/Time    CALCIUM 9.2 11/16/2017 1334   ALKPHOS 95 11/16/2017 1334   ALKPHOS 95 02/19/2014 0902   AST 22 11/16/2017 1334   AST 18 02/19/2014 0902   ALT 16 11/16/2017 1334   ALT 22 02/19/2014 0902   BILITOT 0.6 11/16/2017 1334   BILITOT 0.4 02/19/2014 0902       ASSESSMENT & PLAN:  Iron deficiency anemia due to chronic blood loss # Iron deficiency anemia chronic likely chronic GI blood loss/mal. Multiple endoscopies and colonoscopies.   # Today hemoglobin is 14 ; saturation 18% ferritin 47;-patient is tired; she feels significantly better after IV iron infusion.  Recommend IV Feraheme today.    # Follow-up labs IV Feraheme/M.D. in 6 months.; Labs- few days prior.  Ferrhem today.     Cammie Sickle, MD 11/20/2017 2:05 PM

## 2017-11-20 NOTE — Patient Instructions (Signed)

## 2018-01-29 DIAGNOSIS — R062 Wheezing: Secondary | ICD-10-CM | POA: Diagnosis not present

## 2018-01-29 DIAGNOSIS — I493 Ventricular premature depolarization: Secondary | ICD-10-CM | POA: Diagnosis not present

## 2018-01-29 DIAGNOSIS — M6281 Muscle weakness (generalized): Secondary | ICD-10-CM | POA: Diagnosis not present

## 2018-01-29 DIAGNOSIS — I2510 Atherosclerotic heart disease of native coronary artery without angina pectoris: Secondary | ICD-10-CM | POA: Diagnosis not present

## 2018-04-08 DIAGNOSIS — B351 Tinea unguium: Secondary | ICD-10-CM | POA: Diagnosis not present

## 2018-04-08 DIAGNOSIS — L570 Actinic keratosis: Secondary | ICD-10-CM | POA: Diagnosis not present

## 2018-04-08 DIAGNOSIS — L84 Corns and callosities: Secondary | ICD-10-CM | POA: Diagnosis not present

## 2018-04-08 DIAGNOSIS — L821 Other seborrheic keratosis: Secondary | ICD-10-CM | POA: Diagnosis not present

## 2018-04-19 DIAGNOSIS — J441 Chronic obstructive pulmonary disease with (acute) exacerbation: Secondary | ICD-10-CM | POA: Diagnosis not present

## 2018-04-30 DIAGNOSIS — I2510 Atherosclerotic heart disease of native coronary artery without angina pectoris: Secondary | ICD-10-CM | POA: Diagnosis not present

## 2018-04-30 DIAGNOSIS — M6281 Muscle weakness (generalized): Secondary | ICD-10-CM | POA: Diagnosis not present

## 2018-04-30 DIAGNOSIS — J309 Allergic rhinitis, unspecified: Secondary | ICD-10-CM | POA: Diagnosis not present

## 2018-04-30 DIAGNOSIS — I493 Ventricular premature depolarization: Secondary | ICD-10-CM | POA: Diagnosis not present

## 2018-04-30 DIAGNOSIS — I208 Other forms of angina pectoris: Secondary | ICD-10-CM | POA: Diagnosis not present

## 2018-05-14 ENCOUNTER — Other Ambulatory Visit: Payer: Medicare Other

## 2018-05-21 ENCOUNTER — Ambulatory Visit: Payer: Medicare Other

## 2018-05-21 ENCOUNTER — Ambulatory Visit: Payer: Medicare Other | Admitting: Oncology

## 2018-09-24 DIAGNOSIS — L821 Other seborrheic keratosis: Secondary | ICD-10-CM | POA: Diagnosis not present

## 2018-09-24 DIAGNOSIS — Z1283 Encounter for screening for malignant neoplasm of skin: Secondary | ICD-10-CM | POA: Diagnosis not present

## 2018-09-24 DIAGNOSIS — L570 Actinic keratosis: Secondary | ICD-10-CM | POA: Diagnosis not present

## 2018-09-24 DIAGNOSIS — L578 Other skin changes due to chronic exposure to nonionizing radiation: Secondary | ICD-10-CM | POA: Diagnosis not present

## 2018-09-24 DIAGNOSIS — Z85828 Personal history of other malignant neoplasm of skin: Secondary | ICD-10-CM | POA: Diagnosis not present

## 2018-09-26 DIAGNOSIS — I493 Ventricular premature depolarization: Secondary | ICD-10-CM | POA: Diagnosis not present

## 2018-09-26 DIAGNOSIS — I2510 Atherosclerotic heart disease of native coronary artery without angina pectoris: Secondary | ICD-10-CM | POA: Diagnosis not present

## 2018-09-26 DIAGNOSIS — J439 Emphysema, unspecified: Secondary | ICD-10-CM | POA: Diagnosis not present

## 2018-09-26 DIAGNOSIS — I208 Other forms of angina pectoris: Secondary | ICD-10-CM | POA: Diagnosis not present

## 2018-10-01 DIAGNOSIS — Z961 Presence of intraocular lens: Secondary | ICD-10-CM | POA: Diagnosis not present

## 2018-10-04 DIAGNOSIS — I2510 Atherosclerotic heart disease of native coronary artery without angina pectoris: Secondary | ICD-10-CM | POA: Diagnosis not present

## 2018-10-04 DIAGNOSIS — J439 Emphysema, unspecified: Secondary | ICD-10-CM | POA: Diagnosis not present

## 2018-10-04 DIAGNOSIS — J309 Allergic rhinitis, unspecified: Secondary | ICD-10-CM | POA: Diagnosis not present

## 2018-10-04 DIAGNOSIS — R079 Chest pain, unspecified: Secondary | ICD-10-CM | POA: Diagnosis not present

## 2018-10-04 DIAGNOSIS — I493 Ventricular premature depolarization: Secondary | ICD-10-CM | POA: Diagnosis not present

## 2018-11-20 ENCOUNTER — Other Ambulatory Visit: Payer: Self-pay | Admitting: Internal Medicine

## 2018-11-20 DIAGNOSIS — Z1231 Encounter for screening mammogram for malignant neoplasm of breast: Secondary | ICD-10-CM

## 2018-12-16 ENCOUNTER — Ambulatory Visit
Admission: RE | Admit: 2018-12-16 | Discharge: 2018-12-16 | Disposition: A | Discharge status: Home / Self Care | Payer: Medicare Other | Source: Ambulatory Visit | Attending: Internal Medicine | Admitting: Internal Medicine

## 2018-12-16 DIAGNOSIS — Z1231 Encounter for screening mammogram for malignant neoplasm of breast: Secondary | ICD-10-CM | POA: Insufficient documentation

## 2019-09-09 DIAGNOSIS — Z23 Encounter for immunization: Secondary | ICD-10-CM | POA: Diagnosis not present

## 2019-10-07 DIAGNOSIS — Z872 Personal history of diseases of the skin and subcutaneous tissue: Secondary | ICD-10-CM | POA: Diagnosis not present

## 2019-10-07 DIAGNOSIS — Z85828 Personal history of other malignant neoplasm of skin: Secondary | ICD-10-CM | POA: Diagnosis not present

## 2019-10-07 DIAGNOSIS — L570 Actinic keratosis: Secondary | ICD-10-CM | POA: Diagnosis not present

## 2019-10-07 DIAGNOSIS — L578 Other skin changes due to chronic exposure to nonionizing radiation: Secondary | ICD-10-CM | POA: Diagnosis not present

## 2020-01-16 DIAGNOSIS — I350 Nonrheumatic aortic (valve) stenosis: Secondary | ICD-10-CM | POA: Diagnosis not present

## 2020-01-16 DIAGNOSIS — Z862 Personal history of diseases of the blood and blood-forming organs and certain disorders involving the immune mechanism: Secondary | ICD-10-CM | POA: Diagnosis not present

## 2020-01-16 DIAGNOSIS — I2510 Atherosclerotic heart disease of native coronary artery without angina pectoris: Secondary | ICD-10-CM | POA: Diagnosis not present

## 2020-01-16 DIAGNOSIS — R5383 Other fatigue: Secondary | ICD-10-CM | POA: Diagnosis not present

## 2020-01-21 DIAGNOSIS — I10 Essential (primary) hypertension: Secondary | ICD-10-CM | POA: Diagnosis not present

## 2020-01-21 DIAGNOSIS — R5381 Other malaise: Secondary | ICD-10-CM | POA: Diagnosis not present

## 2020-01-26 ENCOUNTER — Other Ambulatory Visit: Payer: Self-pay | Admitting: Internal Medicine

## 2020-01-26 DIAGNOSIS — Z1231 Encounter for screening mammogram for malignant neoplasm of breast: Secondary | ICD-10-CM

## 2020-02-16 ENCOUNTER — Ambulatory Visit
Admission: RE | Admit: 2020-02-16 | Discharge: 2020-02-16 | Disposition: A | Discharge status: Home / Self Care | Payer: Medicare Other | Source: Ambulatory Visit | Attending: Internal Medicine | Admitting: Internal Medicine

## 2020-02-16 DIAGNOSIS — Z1231 Encounter for screening mammogram for malignant neoplasm of breast: Secondary | ICD-10-CM

## 2020-04-14 ENCOUNTER — Other Ambulatory Visit: Payer: Self-pay | Admitting: Internal Medicine

## 2020-04-15 ENCOUNTER — Ambulatory Visit: Payer: Medicare Other | Admitting: Internal Medicine

## 2020-04-16 ENCOUNTER — Ambulatory Visit (INDEPENDENT_AMBULATORY_CARE_PROVIDER_SITE_OTHER): Payer: Medicare Other | Admitting: Internal Medicine

## 2020-04-16 ENCOUNTER — Encounter: Payer: Self-pay | Admitting: Internal Medicine

## 2020-04-16 ENCOUNTER — Other Ambulatory Visit: Payer: Self-pay

## 2020-04-16 VITALS — BP 133/63 | HR 83 | Wt 144.0 lb

## 2020-04-16 DIAGNOSIS — F430 Acute stress reaction: Secondary | ICD-10-CM | POA: Diagnosis not present

## 2020-04-16 DIAGNOSIS — R5382 Chronic fatigue, unspecified: Secondary | ICD-10-CM

## 2020-04-16 DIAGNOSIS — D500 Iron deficiency anemia secondary to blood loss (chronic): Secondary | ICD-10-CM

## 2020-04-16 DIAGNOSIS — F411 Generalized anxiety disorder: Secondary | ICD-10-CM | POA: Insufficient documentation

## 2020-04-16 MED ORDER — ALPRAZOLAM 0.25 MG PO TABS: 0.2500 mg | ORAL_TABLET | Freq: Two times a day (BID) | ORAL | 0 refills | Status: DC | PRN

## 2020-04-16 NOTE — Assessment & Plan Note (Signed)
Walk daily 

## 2020-04-16 NOTE — Progress Notes (Signed)
Established Patient Office Visit  Subjective:  Patient ID: Daisy Holloway, female    DOB: 05/02/1929  Age: 86 y.o. MRN: [id number]  CC: No chief complaint on file.   HPI  Daisy Holloway presents for for fatigue and tiredness she cannot get much sleep because of husband has to stay awake because of the prostate trouble he has been seen by urologist and he has changed medicine several time but he continued to have problem mostly at night..  Patient denies any chest pain nausea vomiting or shortness of breath her lab test was reviewed and they were found to be normal hemoglobin is okay cholesterol is normal as well as liver function test.  Past Medical History:  Diagnosis Date  . Anemia    IDA  . Arthritis   . Asthma   . GERD (gastroesophageal reflux disease)   . Osteoporosis     Past Surgical History:  Procedure Laterality Date  . ABDOMINAL HYSTERECTOMY  1964   ovaries left in  . BREAST BIOPSY Right 1998   patient states calcifications-benign  . CATARACT EXTRACTION     one eye in 2007 and the other eye 2012  . CHOLECYSTECTOMY    . KNEE SURGERY Right 2004  . KNEE SURGERY Left 2012  . SHOULDER SURGERY  1995    Family History  Problem Relation Age of Onset  . Liver cancer Mother   . Arthritis Mother   . Gastric cancer Father   . Asthma Father   . Diabetes Sister   . Diabetes Brother   . Breast cancer Maternal Aunt   . Breast cancer Cousin     Social History   Socioeconomic History  . Marital status: Married    Spouse name: Not on file  . Number of children: Not on file  . Years of education: Not on file  . Highest education level: Not on file  Occupational History  . Not on file  Tobacco Use  . Smoking status: Never Smoker  . Smokeless tobacco: Never Used  Substance and Sexual Activity  . Alcohol use: Yes    Comment: occ. wine  . Drug use: No  . Sexual activity: Not on file  Other Topics Concern  . Not on file  Social History Narrative  . Not on file     Social Determinants of Health   Financial Resource Strain:   . Difficulty of Paying Living Expenses:   Food Insecurity:   . Worried About Charity fundraiser in the Last Year:   . Arboriculturist in the Last Year:   Transportation Needs:   . Film/video editor (Medical):   Marland Kitchen Lack of Transportation (Non-Medical):   Physical Activity:   . Days of Exercise per Week:   . Minutes of Exercise per Session:   Stress:   . Feeling of Stress :   Social Connections:   . Frequency of Communication with Friends and Family:   . Frequency of Social Gatherings with Friends and Family:   . Attends Religious Services:   . Active Member of Clubs or Organizations:   . Attends Archivist Meetings:   Marland Kitchen Marital Status:   Intimate Partner Violence:   . Fear of Current or Ex-Partner:   . Emotionally Abused:   Marland Kitchen Physically Abused:   . Sexually Abused:      Current Outpatient Medications:  .  Albuterol Sulfate (PROAIR HFA IN), Inhale into the lungs 4 (four) times daily as needed.  2 puffs, Disp: , Rfl:  .  aspirin EC 81 MG tablet, Take 81 mg by mouth daily., Disp: , Rfl:  .  atorvastatin (LIPITOR) 10 MG tablet, Take by mouth., Disp: , Rfl:  .  Cholecalciferol (VITAMIN D3) 2000 UNITS capsule, Take 5,000 Units by mouth daily. , Disp: , Rfl:  .  diltiazem (CARDIZEM CD) 120 MG 24 hr capsule, Take 120 mg by mouth daily., Disp: , Rfl: 4 .  fluticasone (FLONASE) 50 MCG/ACT nasal spray, Place into the nose., Disp: , Rfl:  .  Glycerin-Hypromellose-PEG 400 ([facility] DRY EYE RELIEF) 0.2-0.2-1 % SOLN, Apply to eye., Disp: , Rfl:  .  ipratropium-albuterol (DUONEB) 0.5-2.5 (3) MG/3ML SOLN, See admin instructions., Disp: , Rfl: 8 .  loratadine (CLARITIN) 10 MG tablet, Take 10 mg by mouth daily. Reported on 06/13/2016, Disp: , Rfl:  .  Multiple Vitamin (MULTI-VITAMINS) TABS, Take by mouth. Reported on 06/13/2016, Disp: , Rfl:  .  nystatin-triamcinolone (MYCOLOG II) cream, APPLY TO AFFECTED AREA TWICE A DAY,  Disp: , Rfl: 5 .  Omega-3 Fatty Acids (FISH OIL) 1000 MG CPDR, Take by mouth., Disp: , Rfl:  .  omeprazole (PRILOSEC) 20 MG capsule, Take 20 mg by mouth daily as needed., Disp: , Rfl:  .  sodium chloride (OCEAN) 0.65 % nasal spray, Place into the nose., Disp: , Rfl:  .  Umeclidinium-Vilanterol (ANORO ELLIPTA IN), Inhale 1 puff into the lungs daily., Disp: , Rfl:  .  vitamin B-12 (CYANOCOBALAMIN) 1000 MCG tablet, Take by mouth., Disp: , Rfl:  .  WIXELA INHUB 250-50 MCG/DOSE AEPB, INHALE 1 PUFF BY MOUTH  TWICE DAILY, Disp: 180 each, Rfl: 3   Allergies  Allergen Reactions  . Aspirin Nausea Only    Other reaction(s): Distress (finding) Other reaction(s): Distress (finding)  . Sulfa Antibiotics Hives    Other reaction(s): Unknown Other reaction(s): Unknown    ROS Review of Systems  Constitutional: Positive for fatigue.  HENT: Negative.   Eyes: Negative.   Respiratory: Negative.   Cardiovascular: Negative.   Gastrointestinal: Negative.   Genitourinary: Negative for flank pain.  Neurological: Negative for light-headedness.  Psychiatric/Behavioral: Negative for behavioral problems.      Objective:    Physical Exam  Constitutional: She appears well-developed and well-nourished.  HENT:  Head: Atraumatic.  Eyes: Right eye exhibits no discharge.  Neck: No JVD present.  Cardiovascular: Normal rate.  Pulmonary/Chest: She has no wheezes.  Abdominal: She exhibits no mass. There is no rebound and no guarding.  Musculoskeletal:        General: No edema.     Cervical back: Neck supple.  Lymphadenopathy:    She has no cervical adenopathy.  Neurological: She is alert.    BP 133/63   Pulse 83   Wt 144 lb (65.3 kg)   BMI 24.72 kg/m  Wt Readings from Last 3 Encounters:  04/16/20 144 lb (65.3 kg)  11/20/17 165 lb 8 oz (75.1 kg)  07/17/17 165 lb (74.8 kg)     Health Maintenance Due  Topic Date Due  . TETANUS/TDAP  Never done  . DEXA SCAN  Never done  . PNA vac Low Risk Adult  (2 of 2 - PPSV23) 09/22/2016    There are no preventive care reminders to display for this patient.  No results found for: TSH Lab Results  Component Value Date   WBC 5.8 11/16/2017   HGB 14.0 11/16/2017   HCT 42.8 11/16/2017   MCV 84.5 11/16/2017   PLT 172 11/16/2017  Lab Results  Component Value Date   NA 138 11/16/2017   K 4.3 11/16/2017   CO2 24 11/16/2017   GLUCOSE 110 (H) 11/16/2017   BUN 16 11/16/2017   CREATININE 0.80 11/16/2017   BILITOT 0.6 11/16/2017   ALKPHOS 95 11/16/2017   AST 22 11/16/2017   ALT 16 11/16/2017   PROT 7.7 11/16/2017   ALBUMIN 4.1 11/16/2017   CALCIUM 9.2 11/16/2017   ANIONGAP 9 11/16/2017   No results found for: CHOL No results found for: HDL No results found for: LDLCALC No results found for: TRIG No results found for: CHOLHDL No results found for: HGBA1C    Assessment & Plan:          Patient has problem with anxiety I will give her alprazolam 0.25 mg as needed she will be provided with 30 pills usually last her for several months. Problem List Items Addressed This Visit    None      No orders of the defined types were placed in this encounter.  1. Anxiety as acute reaction to exceptional stress Stable  2. Iron deficiency anemia due to chronic blood loss Stable  3. Chronic fatigue Advised to walk daily Follow-up: Follow-up about 8 weeks    Cletis Athens, MD

## 2020-04-16 NOTE — Assessment & Plan Note (Signed)
Patient is anxious because of the home situation

## 2020-04-17 ENCOUNTER — Encounter: Payer: Self-pay | Admitting: Internal Medicine

## 2020-07-19 ENCOUNTER — Ambulatory Visit (INDEPENDENT_AMBULATORY_CARE_PROVIDER_SITE_OTHER): Payer: Medicare Other | Admitting: Internal Medicine

## 2020-07-19 ENCOUNTER — Encounter: Payer: Self-pay | Admitting: Internal Medicine

## 2020-07-19 ENCOUNTER — Other Ambulatory Visit: Payer: Self-pay

## 2020-07-19 VITALS — BP 132/55 | HR 81 | Temp 96.8°F | Ht 63.0 in | Wt 144.0 lb

## 2020-07-19 DIAGNOSIS — J42 Unspecified chronic bronchitis: Secondary | ICD-10-CM

## 2020-07-19 DIAGNOSIS — F411 Generalized anxiety disorder: Secondary | ICD-10-CM | POA: Diagnosis not present

## 2020-07-19 DIAGNOSIS — R5382 Chronic fatigue, unspecified: Secondary | ICD-10-CM

## 2020-07-19 DIAGNOSIS — D500 Iron deficiency anemia secondary to blood loss (chronic): Secondary | ICD-10-CM | POA: Diagnosis not present

## 2020-07-19 DIAGNOSIS — F430 Acute stress reaction: Secondary | ICD-10-CM

## 2020-07-19 MED ORDER — ALPRAZOLAM 0.25 MG PO TABS: 0.2500 mg | ORAL_TABLET | Freq: Two times a day (BID) | ORAL | 0 refills | Status: DC | PRN

## 2020-07-19 NOTE — Progress Notes (Signed)
Daisy Holloway is a 86 y.o. female here for a routine f/u.

## 2020-07-19 NOTE — Progress Notes (Signed)
Established Patient Office Visit  SUBJECTIVE:  Subjective  Patient ID: Daisy Holloway, female    DOB: 05/02/1929  Age: 86 y.o. MRN: [id number]  CC:  Chief Complaint  Patient presents with  . Follow-up    HPI Daisy Holloway is a 86 y.o. female presenting today for a routine follow up.  She continues to not sleep well at night; she notes that her husband keeps her up at night due to his urological issues, so she is not getting a good nights sleep most nights.   She notes that her anxiety is under control recently. Her continues to worry about her husband due to his health however.   Past Medical History:  Diagnosis Date  . Anemia    IDA  . Arthritis   . Asthma   . GERD (gastroesophageal reflux disease)   . Iron deficiency anemia due to chronic blood loss 04/01/2015  . Osteoporosis     Past Surgical History:  Procedure Laterality Date  . ABDOMINAL HYSTERECTOMY  1964   ovaries left in  . BREAST BIOPSY Right 1998   patient states calcifications-benign  . CATARACT EXTRACTION     one eye in 2007 and the other eye 2012  . CHOLECYSTECTOMY    . KNEE SURGERY Right 2004  . KNEE SURGERY Left 2012  . SHOULDER SURGERY  1995    Family History  Problem Relation Age of Onset  . Liver cancer Mother   . Arthritis Mother   . Gastric cancer Father   . Asthma Father   . Diabetes Sister   . Diabetes Brother   . Breast cancer Maternal Aunt   . Breast cancer Cousin     Social History   Socioeconomic History  . Marital status: Married    Spouse name: Not on file  . Number of children: Not on file  . Years of education: Not on file  . Highest education level: Not on file  Occupational History  . Not on file  Tobacco Use  . Smoking status: Never Smoker  . Smokeless tobacco: Never Used  Vaping Use  . Vaping Use: Never used  Substance and Sexual Activity  . Alcohol use: Yes    Comment: occ. wine  . Drug use: No  . Sexual activity: Not on file  Other Topics Concern  .  Not on file  Social History Narrative  . Not on file   Social Determinants of Health   Financial Resource Strain:   . Difficulty of Paying Living Expenses:   Food Insecurity:   . Worried About Charity fundraiser in the Last Year:   . Arboriculturist in the Last Year:   Transportation Needs:   . Film/video editor (Medical):   Marland Kitchen Lack of Transportation (Non-Medical):   Physical Activity:   . Days of Exercise per Week:   . Minutes of Exercise per Session:   Stress:   . Feeling of Stress :   Social Connections:   . Frequency of Communication with Friends and Family:   . Frequency of Social Gatherings with Friends and Family:   . Attends Religious Services:   . Active Member of Clubs or Organizations:   . Attends Archivist Meetings:   Marland Kitchen Marital Status:   Intimate Partner Violence:   . Fear of Current or Ex-Partner:   . Emotionally Abused:   Marland Kitchen Physically Abused:   . Sexually Abused:      Current Outpatient Medications:  .  Albuterol Sulfate (PROAIR HFA IN), Inhale into the lungs 4 (four) times daily as needed. 2 puffs, Disp: , Rfl:  .  ALPRAZolam (XANAX) 0.25 MG tablet, Take 1 tablet (0.25 mg total) by mouth 2 (two) times daily as needed for anxiety., Disp: 20 tablet, Rfl: 0 .  aspirin EC 81 MG tablet, Take 81 mg by mouth daily., Disp: , Rfl:  .  atorvastatin (LIPITOR) 10 MG tablet, Take by mouth., Disp: , Rfl:  .  Cholecalciferol (VITAMIN D3) 2000 UNITS capsule, Take 5,000 Units by mouth daily. , Disp: , Rfl:  .  diltiazem (CARDIZEM CD) 120 MG 24 hr capsule, Take 120 mg by mouth daily., Disp: , Rfl: 4 .  fluticasone (FLONASE) 50 MCG/ACT nasal spray, Place into the nose., Disp: , Rfl:  .  Glycerin-Hypromellose-PEG 400 ([facility] DRY EYE RELIEF) 0.2-0.2-1 % SOLN, Apply to eye., Disp: , Rfl:  .  ipratropium-albuterol (DUONEB) 0.5-2.5 (3) MG/3ML SOLN, See admin instructions., Disp: , Rfl: 8 .  loratadine (CLARITIN) 10 MG tablet, Take 10 mg by mouth daily. Reported on  06/13/2016, Disp: , Rfl:  .  Multiple Vitamin (MULTI-VITAMINS) TABS, Take by mouth. Reported on 06/13/2016, Disp: , Rfl:  .  nystatin-triamcinolone (MYCOLOG II) cream, APPLY TO AFFECTED AREA TWICE A DAY, Disp: , Rfl: 5 .  Omega-3 Fatty Acids (FISH OIL) 1000 MG CPDR, Take by mouth., Disp: , Rfl:  .  omeprazole (PRILOSEC) 20 MG capsule, Take 20 mg by mouth daily as needed., Disp: , Rfl:  .  sodium chloride (OCEAN) 0.65 % nasal spray, Place into the nose., Disp: , Rfl:  .  Umeclidinium-Vilanterol (ANORO ELLIPTA IN), Inhale 1 puff into the lungs daily., Disp: , Rfl:  .  vitamin B-12 (CYANOCOBALAMIN) 1000 MCG tablet, Take by mouth., Disp: , Rfl:  .  WIXELA INHUB 250-50 MCG/DOSE AEPB, INHALE 1 PUFF BY MOUTH  TWICE DAILY, Disp: 180 each, Rfl: 3   Allergies  Allergen Reactions  . Aspirin Nausea Only    Other reaction(s): Distress (finding) Other reaction(s): Distress (finding)  . Sulfa Antibiotics Hives    Other reaction(s): Unknown Other reaction(s): Unknown    ROS Review of Systems  Constitutional: Positive for fatigue (sleep disturbance).  HENT: Negative.   Eyes: Negative.   Respiratory: Negative.   Cardiovascular: Negative.   Gastrointestinal: Negative.   Endocrine: Negative.   Genitourinary: Negative.   Musculoskeletal: Negative.   Skin: Negative.   Allergic/Immunologic: Negative.   Neurological: Negative.   Hematological: Negative.   Psychiatric/Behavioral: Positive for sleep disturbance (husband keeping awake).  All other systems reviewed and are negative.    OBJECTIVE:    Physical Exam Vitals reviewed.  Constitutional:      Appearance: Normal appearance.  HENT:     Mouth/Throat:     Mouth: Mucous membranes are moist.  Eyes:     Pupils: Pupils are equal, round, and reactive to light.  Neck:     Vascular: No carotid bruit.  Cardiovascular:     Rate and Rhythm: Normal rate and regular rhythm.     Pulses: Normal pulses.     Heart sounds: Murmur heard.  Systolic  murmur is present with a grade of 2/6.   Pulmonary:     Effort: Pulmonary effort is normal.     Breath sounds: Examination of the right-upper field reveals wheezing. Wheezing present.  Abdominal:     General: Bowel sounds are normal.     Palpations: Abdomen is soft. There is no hepatomegaly, splenomegaly or mass.  Tenderness: There is no abdominal tenderness.     Hernia: No hernia is present.  Musculoskeletal:        General: No tenderness.     Cervical back: Neck supple.     Right lower leg: No edema.     Left lower leg: No edema.  Skin:    Findings: No rash.  Neurological:     Mental Status: She is alert and oriented to person, place, and time.     Motor: No weakness.  Psychiatric:        Mood and Affect: Mood and affect normal.        Behavior: Behavior normal.     BP (!) 132/55   Pulse 81   Temp (!) 96.8 F (36 C)   Ht 5\' 3"  (1.6 m)   Wt 144 lb (65.3 kg)   BMI 25.51 kg/m  Wt Readings from Last 3 Encounters:  07/19/20 144 lb (65.3 kg)  04/16/20 144 lb (65.3 kg)  11/20/17 165 lb 8 oz (75.1 kg)    Health Maintenance Due  Topic Date Due  . TETANUS/TDAP  Never done  . DEXA SCAN  Never done  . PNA vac Low Risk Adult (2 of 2 - PPSV23) 09/22/2016    There are no preventive care reminders to display for this patient.  CBC Latest Ref Rng & Units 11/16/2017 07/12/2017 03/08/2017  WBC 3.6 - 11.0 K/uL 5.8 4.4 4.4  Hemoglobin 12.0 - 16.0 g/dL 14.0 12.4 11.9(L)  Hematocrit 35 - 47 % 42.8 38.7 38.4  Platelets 150 - 440 K/uL 172 158 165   CMP Latest Ref Rng & Units 11/16/2017 07/12/2017 03/08/2017  Glucose 65 - 99 mg/dL 110(H) 104(H) 87  BUN 6 - 20 mg/dL 16 14 18   Creatinine 0.44 - 1.00 mg/dL 0.80 0.79 0.74  Sodium 135 - 145 mmol/L 138 138 138  Potassium 3.5 - 5.1 mmol/L 4.3 4.4 4.0  Chloride 101 - 111 mmol/L 105 104 104  CO2 22 - 32 mmol/L 24 27 27   Calcium 8.9 - 10.3 mg/dL 9.2 9.2 9.6  Total Protein 6.5 - 8.1 g/dL 7.7 6.9 7.2  Total Bilirubin 0.3 - 1.2 mg/dL 0.6 0.6  0.7  Alkaline Phos 38 - 126 U/L 95 90 89  AST 15 - 41 U/L 22 22 23   ALT 14 - 54 U/L 16 15 17     No results found for: TSH Lab Results  Component Value Date   ALBUMIN 4.1 11/16/2017   ANIONGAP 9 11/16/2017   No results found for: CHOL, HDL, LDLCALC, CHOLHDL No results found for: TRIG No results found for: HGBA1C    ASSESSMENT & PLAN:   Problem List Items Addressed This Visit      Respiratory   Chronic bronchitis (HCC)     Other   Iron deficiency anemia due to chronic blood loss    Take supplemental  iron      Anxiety as acute reaction to exceptional stress   Relevant Medications   ALPRAZolam (XANAX) 0.25 MG tablet   Chronic fatigue - Primary   Relevant Medications   ALPRAZolam (XANAX) 0.25 MG tablet      Meds ordered this encounter  Medications  . ALPRAZolam (XANAX) 0.25 MG tablet    Sig: Take 1 tablet (0.25 mg total) by mouth 2 (two) times daily as needed for anxiety.    Dispense:  20 tablet    Refill:  0    Follow-up: No follow-ups on file.    Dr. Viann Shove  [place] [address] [place], [place] [address]   By signing my name below, I, [employer], attest that this documentation has been prepared under the direction and in the presence of Cletis Athens, MD. Electronically Signed: Cletis Athens, MD 07/19/20, 2:45 PM   I personally performed the services described in this documentation, which was SCRIBED in my presence. The recorded information has been reviewed and considered accurate. It has been edited as necessary during review. Cletis Athens, MD

## 2020-07-19 NOTE — Assessment & Plan Note (Signed)
Take supplemental  iron

## 2020-07-22 ENCOUNTER — Other Ambulatory Visit: Payer: Self-pay | Admitting: *Deleted

## 2020-07-22 MED ORDER — DILTIAZEM HCL ER COATED BEADS 120 MG PO CP24: 120.0000 mg | ORAL_CAPSULE | Freq: Every day | ORAL | 3 refills | Status: DC

## 2020-09-02 ENCOUNTER — Other Ambulatory Visit: Payer: Self-pay | Admitting: *Deleted

## 2020-09-02 IMAGING — MG DIGITAL SCREENING BILAT W/ TOMO W/ CAD
8 series · 8 of 24 positions shown · non-contrast
Comparison: Previous exam(s).

CLINICAL DATA: Screening.

EXAM:
DIGITAL SCREENING BILATERAL MAMMOGRAM WITH TOMO AND CAD

[R CC synth-2D]
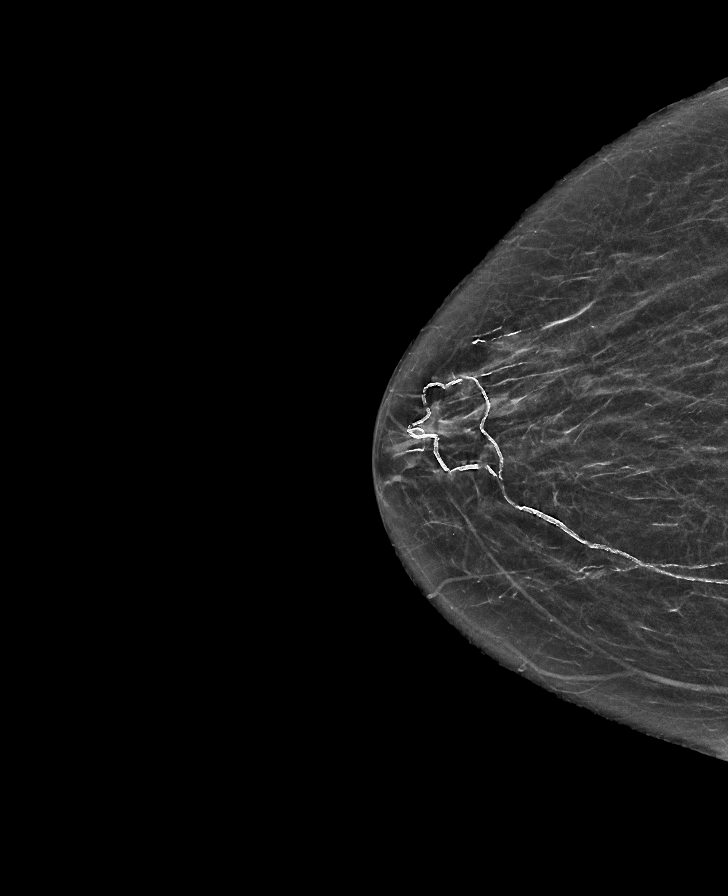

[L CC synth-2D]
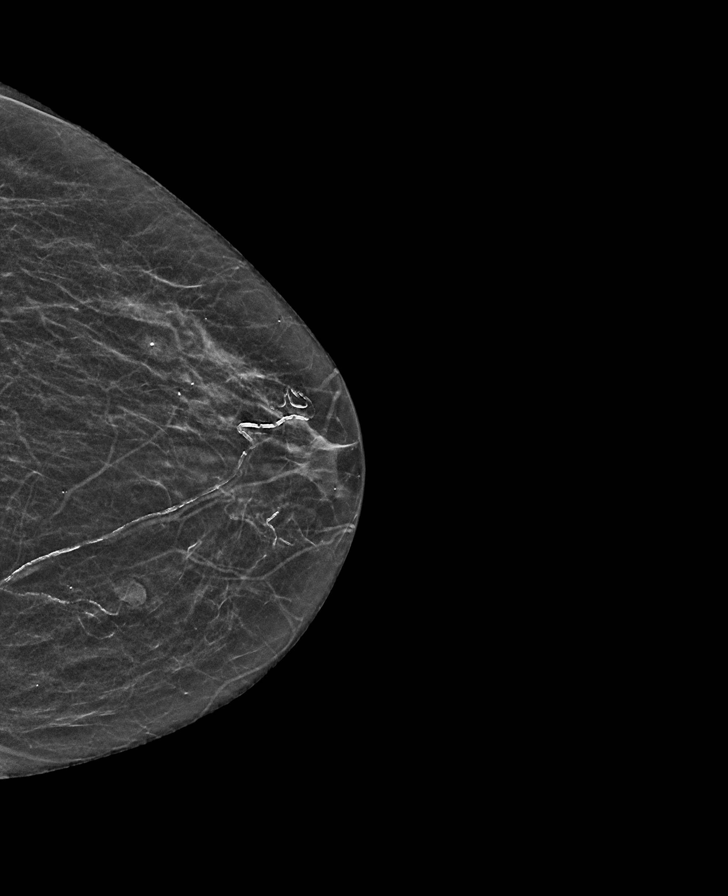

[R MLO synth-2D]
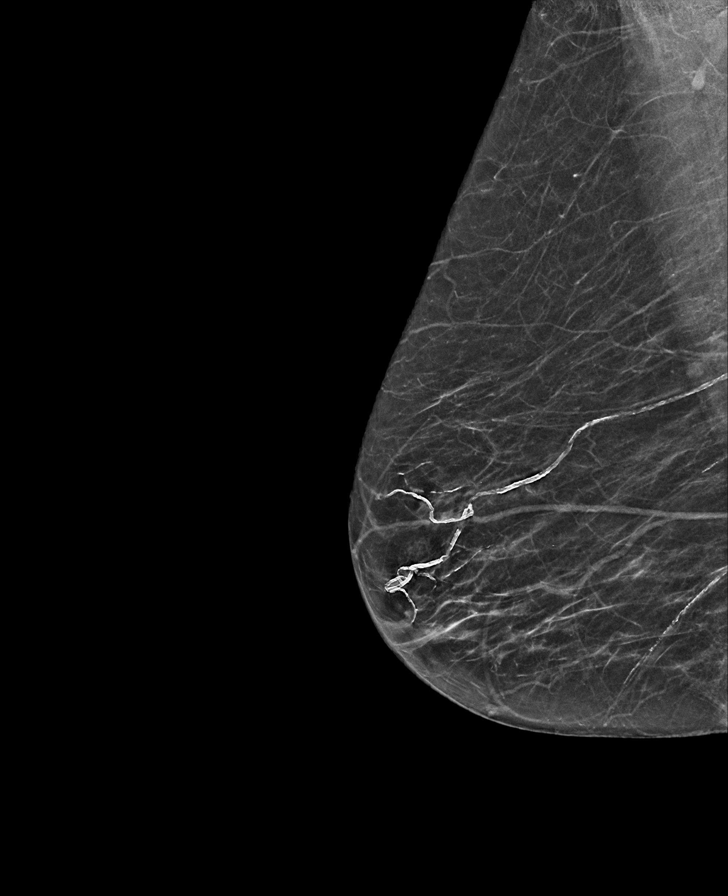

[L MLO synth-2D]
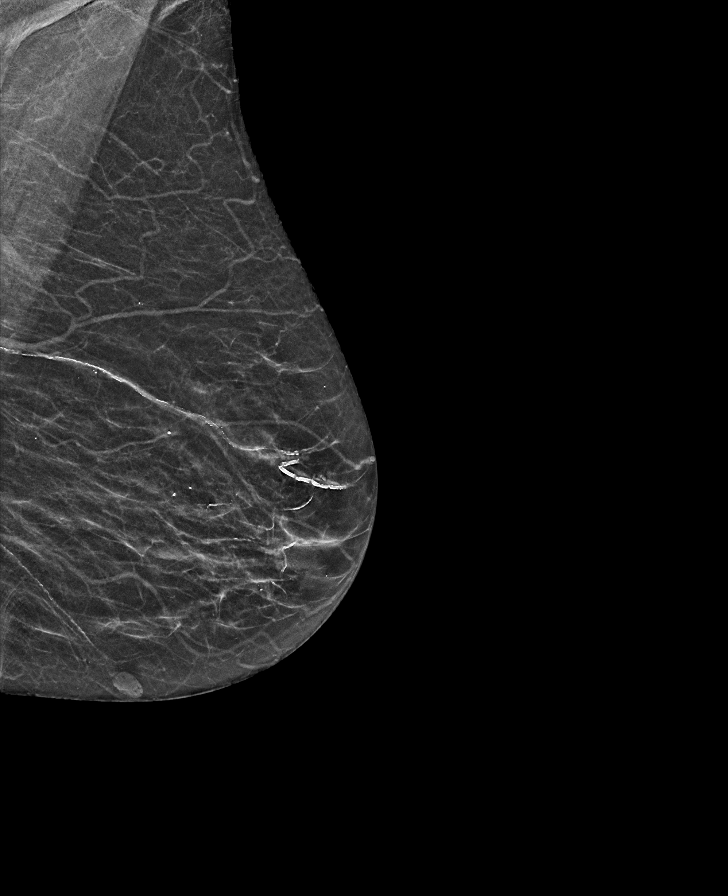

[R MLO tomo · tomo slice 24/47.0]
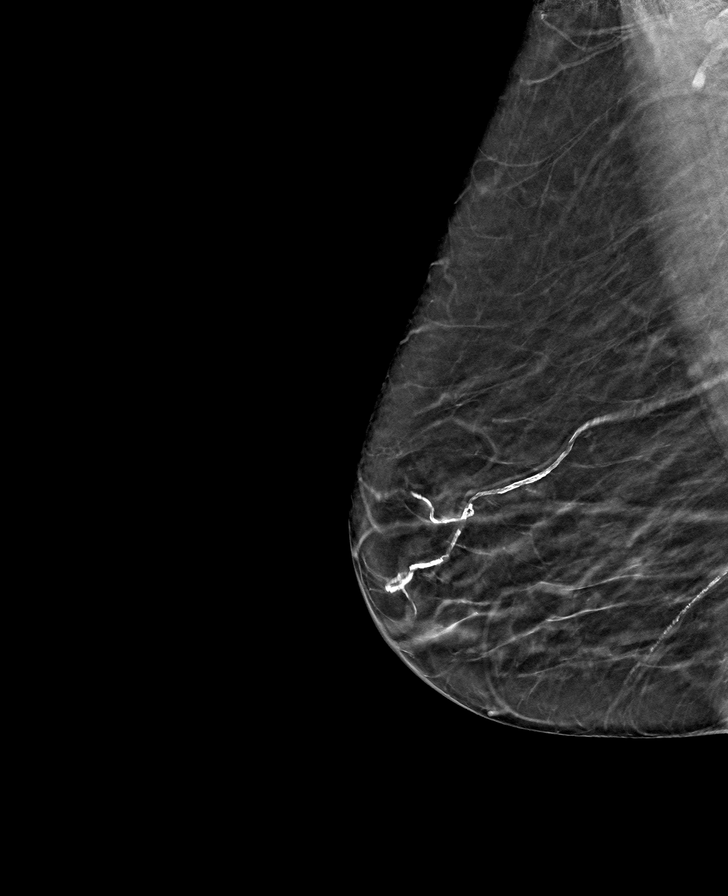

[R CC tomo · tomo slice 25/48.0]
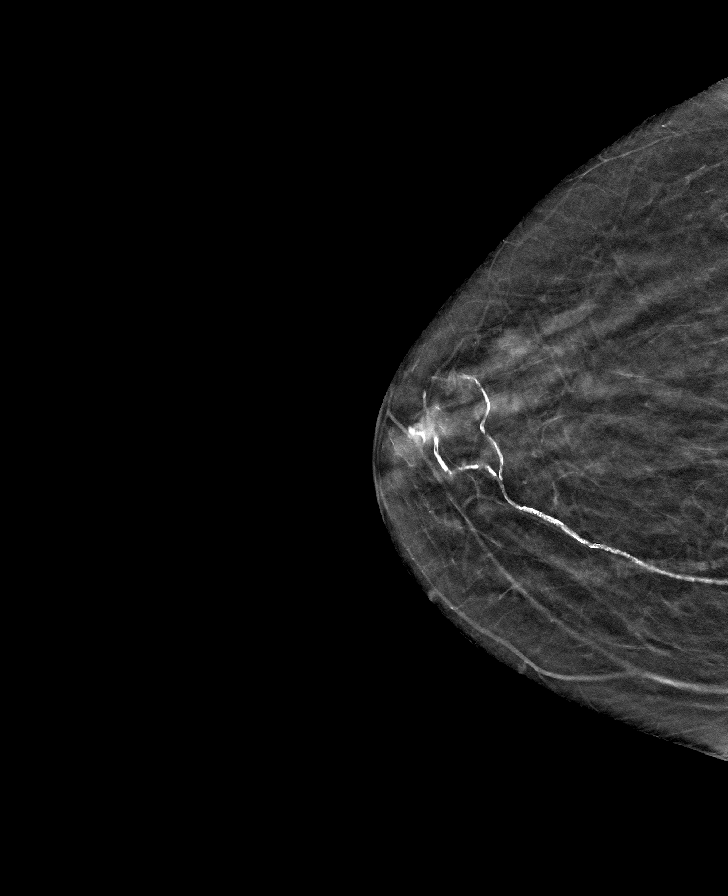

[L CC tomo · tomo slice 21/40.0]
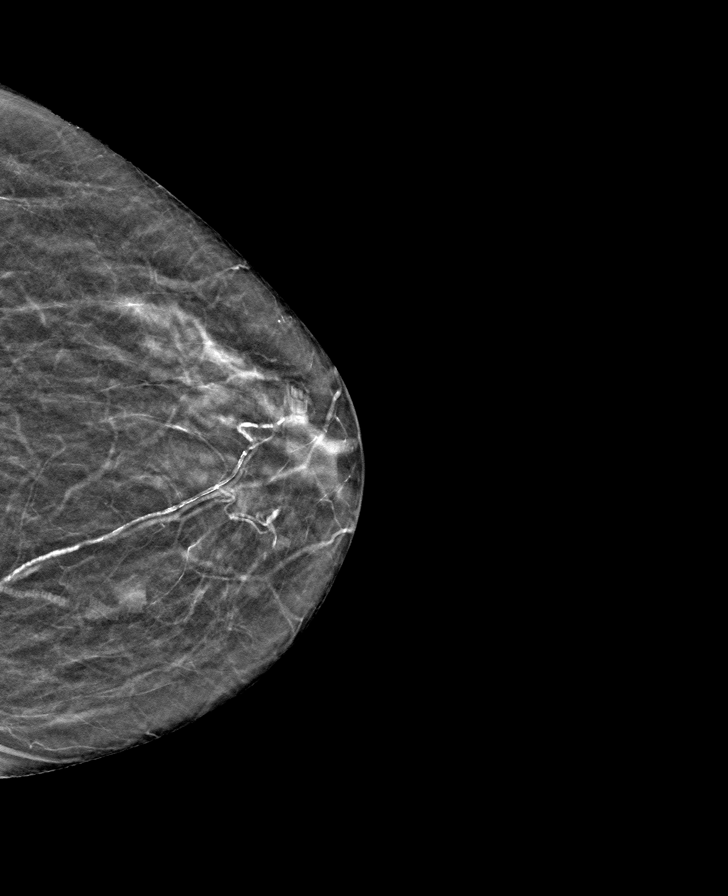

[L MLO tomo · tomo slice 21/42.0]
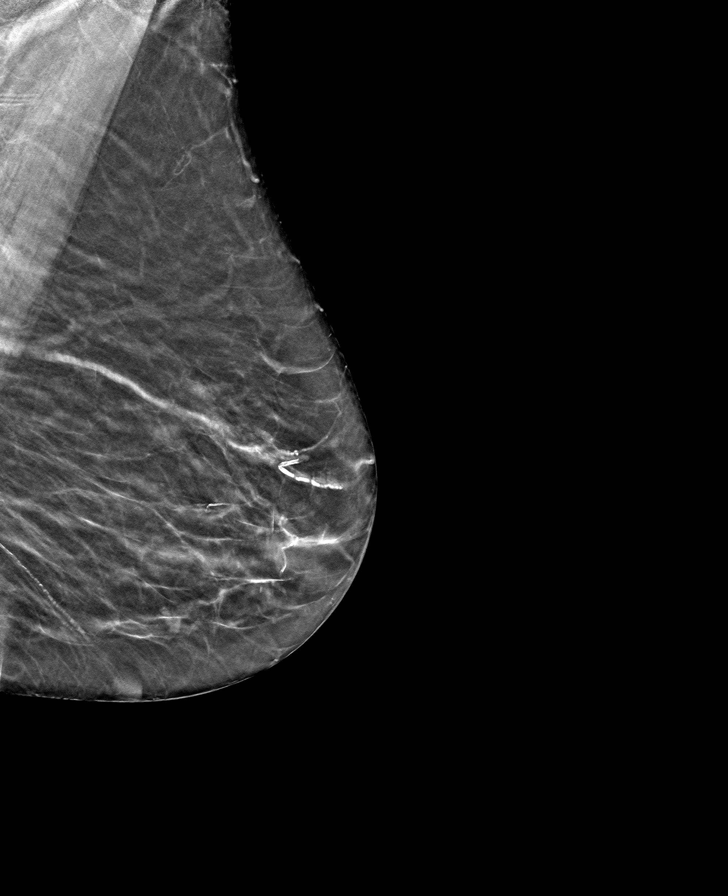

[8 of 24 positions shown; findings below may reference images not displayed]

ACR Breast Density Category b: There are scattered areas of
fibroglandular density.
FINDINGS: There are no findings suspicious for malignancy. Images were
processed with CAD.
IMPRESSION: No mammographic evidence of malignancy. A result letter of this
screening mammogram will be mailed directly to the patient.

RECOMMENDATION:
Screening mammogram in one year. (Code:[id number])

BI-RADS CATEGORY  1: Negative.

## 2020-09-02 MED ORDER — DILTIAZEM HCL ER COATED BEADS 120 MG PO CP24: 120.0000 mg | ORAL_CAPSULE | Freq: Every day | ORAL | 3 refills | Status: DC

## 2020-09-16 ENCOUNTER — Encounter: Payer: Self-pay | Admitting: Family Medicine

## 2020-09-16 ENCOUNTER — Ambulatory Visit (INDEPENDENT_AMBULATORY_CARE_PROVIDER_SITE_OTHER): Payer: Medicare Other | Admitting: Family Medicine

## 2020-09-16 ENCOUNTER — Other Ambulatory Visit: Payer: Self-pay

## 2020-09-16 VITALS — BP 129/57 | HR 74 | Ht 64.0 in | Wt 141.9 lb

## 2020-09-16 DIAGNOSIS — D500 Iron deficiency anemia secondary to blood loss (chronic): Secondary | ICD-10-CM | POA: Diagnosis not present

## 2020-09-16 DIAGNOSIS — I059 Rheumatic mitral valve disease, unspecified: Secondary | ICD-10-CM | POA: Diagnosis not present

## 2020-09-16 DIAGNOSIS — J42 Unspecified chronic bronchitis: Secondary | ICD-10-CM

## 2020-09-16 DIAGNOSIS — Z0000 Encounter for general adult medical examination without abnormal findings: Secondary | ICD-10-CM | POA: Diagnosis not present

## 2020-09-16 NOTE — Assessment & Plan Note (Signed)
Taking Iron daily, no constipation reported, denies fatigue.

## 2020-09-16 NOTE — Progress Notes (Signed)
Established Patient Office Visit  SUBJECTIVE:  Subjective  Patient ID: Daisy Holloway, female    DOB: 05/02/1929  Age: 86 y.o. MRN: [id number]  CC:  Chief Complaint  Patient presents with  . Annual Exam    HPI Daisy Holloway is a 86 y.o. female presenting today for an annual exam.  She is taking her medications as directed and without any complication. She denies any missed doses. She continues to take iron every day for her iron deficiency. Her blood pressure today is 129/57.   She notes that Sunday she started to have a sore throat and started to get a little short of breath and developed a mild cough. She has been using her nebulizer before she goes to bed at night. She took some prednisone she had to help her as well. She notes that the change in the weather has been affecting her.  She does have stress due to her taking full care of her husband.   She is fully vaccinated against COVID19. She had mammography on 02/16/2020. Because she does not feel well today, she plans to come back next week to get her influenza vaccine.    Past Medical History:  Diagnosis Date  . Anemia    IDA  . Arthritis   . Asthma   . GERD (gastroesophageal reflux disease)   . Iron deficiency anemia due to chronic blood loss 04/01/2015  . Osteoporosis     Past Surgical History:  Procedure Laterality Date  . ABDOMINAL HYSTERECTOMY  1964   ovaries left in  . BREAST BIOPSY Right 1998   patient states calcifications-benign  . CATARACT EXTRACTION     one eye in 2007 and the other eye 2012  . CHOLECYSTECTOMY    . KNEE SURGERY Right 2004  . KNEE SURGERY Left 2012  . SHOULDER SURGERY  1995    Family History  Problem Relation Age of Onset  . Liver cancer Mother   . Arthritis Mother   . Gastric cancer Father   . Asthma Father   . Diabetes Sister   . Diabetes Brother   . Breast cancer Maternal Aunt   . Breast cancer Cousin     Social History   Socioeconomic History  . Marital status:  Married    Spouse name: Not on file  . Number of children: Not on file  . Years of education: Not on file  . Highest education level: Not on file  Occupational History  . Not on file  Tobacco Use  . Smoking status: Never Smoker  . Smokeless tobacco: Never Used  Vaping Use  . Vaping Use: Never used  Substance and Sexual Activity  . Alcohol use: Yes    Comment: occ. wine  . Drug use: No  . Sexual activity: Not on file  Other Topics Concern  . Not on file  Social History Narrative  . Not on file   Social Determinants of Health   Financial Resource Strain:   . Difficulty of Paying Living Expenses: Not on file  Food Insecurity:   . Worried About Charity fundraiser in the Last Year: Not on file  . Ran Out of Food in the Last Year: Not on file  Transportation Needs:   . Lack of Transportation (Medical): Not on file  . Lack of Transportation (Non-Medical): Not on file  Physical Activity:   . Days of Exercise per Week: Not on file  . Minutes of Exercise per Session: Not on file  Stress:   . Feeling of Stress : Not on file  Social Connections:   . Frequency of Communication with Friends and Family: Not on file  . Frequency of Social Gatherings with Friends and Family: Not on file  . Attends Religious Services: Not on file  . Active Member of Clubs or Organizations: Not on file  . Attends Archivist Meetings: Not on file  . Marital Status: Not on file  Intimate Partner Violence:   . Fear of Current or Ex-Partner: Not on file  . Emotionally Abused: Not on file  . Physically Abused: Not on file  . Sexually Abused: Not on file     Current Outpatient Medications:  .  Albuterol Sulfate (PROAIR HFA IN), Inhale into the lungs 4 (four) times daily as needed. 2 puffs, Disp: , Rfl:  .  ALPRAZolam (XANAX) 0.25 MG tablet, Take 1 tablet (0.25 mg total) by mouth 2 (two) times daily as needed for anxiety., Disp: 20 tablet, Rfl: 0 .  aspirin EC 81 MG tablet, Take 81 mg by mouth  daily., Disp: , Rfl:  .  atorvastatin (LIPITOR) 10 MG tablet, Take by mouth., Disp: , Rfl:  .  Cholecalciferol (VITAMIN D3) 2000 UNITS capsule, Take 5,000 Units by mouth daily. , Disp: , Rfl:  .  diltiazem (CARDIZEM CD) 120 MG 24 hr capsule, Take 1 capsule (120 mg total) by mouth daily., Disp: 90 capsule, Rfl: 3 .  fluticasone (FLONASE) 50 MCG/ACT nasal spray, Place into the nose., Disp: , Rfl:  .  Glycerin-Hypromellose-PEG 400 ([facility] DRY EYE RELIEF) 0.2-0.2-1 % SOLN, Apply to eye., Disp: , Rfl:  .  ipratropium-albuterol (DUONEB) 0.5-2.5 (3) MG/3ML SOLN, See admin instructions., Disp: , Rfl: 8 .  loratadine (CLARITIN) 10 MG tablet, Take 10 mg by mouth daily. Reported on 06/13/2016, Disp: , Rfl:  .  Multiple Vitamin (MULTI-VITAMINS) TABS, Take by mouth. Reported on 06/13/2016, Disp: , Rfl:  .  nystatin-triamcinolone (MYCOLOG II) cream, APPLY TO AFFECTED AREA TWICE A DAY, Disp: , Rfl: 5 .  Omega-3 Fatty Acids (FISH OIL) 1000 MG CPDR, Take by mouth., Disp: , Rfl:  .  omeprazole (PRILOSEC) 20 MG capsule, Take 20 mg by mouth daily as needed., Disp: , Rfl:  .  sodium chloride (OCEAN) 0.65 % nasal spray, Place into the nose., Disp: , Rfl:  .  Umeclidinium-Vilanterol (ANORO ELLIPTA IN), Inhale 1 puff into the lungs daily., Disp: , Rfl:  .  vitamin B-12 (CYANOCOBALAMIN) 1000 MCG tablet, Take by mouth., Disp: , Rfl:  .  WIXELA INHUB 250-50 MCG/DOSE AEPB, INHALE 1 PUFF BY MOUTH  TWICE DAILY, Disp: 180 each, Rfl: 3   Allergies  Allergen Reactions  . Aspirin Nausea Only    Other reaction(s): Distress (finding) Other reaction(s): Distress (finding)  . Sulfa Antibiotics Hives    Other reaction(s): Unknown Other reaction(s): Unknown    ROS Review of Systems  Constitutional: Positive for fatigue.  HENT: Positive for sore throat (mild).   Eyes: Negative.   Respiratory: Positive for wheezing (using nebulizer at home).   Cardiovascular: Positive for palpitations (occasional). Negative for chest pain.    Gastrointestinal: Negative.  Constipation: drinks prune juice regularly.  Endocrine: Negative.   Genitourinary: Negative.   Musculoskeletal: Negative.   Skin: Negative.   Allergic/Immunologic: Negative.   Neurological: Negative.   Hematological: Negative.   Psychiatric/Behavioral: Positive for sleep disturbance. The patient is nervous/anxious.   All other systems reviewed and are negative.    OBJECTIVE:    Physical Exam  Vitals reviewed.  Constitutional:      Appearance: Normal appearance. She is normal weight.  HENT:     Mouth/Throat:     Mouth: Mucous membranes are moist.  Eyes:     Pupils: Pupils are equal, round, and reactive to light.  Neck:     Vascular: No carotid bruit.  Cardiovascular:     Rate and Rhythm: Normal rate and regular rhythm.     Pulses: Normal pulses.     Heart sounds: Normal heart sounds.  Pulmonary:     Effort: Pulmonary effort is normal.     Breath sounds: Wheezing present.  Abdominal:     General: Bowel sounds are normal.     Palpations: Abdomen is soft. There is no hepatomegaly, splenomegaly or mass.     Tenderness: There is no abdominal tenderness.     Hernia: No hernia is present.  Musculoskeletal:        General: No tenderness.     Cervical back: Neck supple.     Right lower leg: No edema.     Left lower leg: No edema.  Skin:    Findings: No rash.  Neurological:     Mental Status: She is alert and oriented to person, place, and time.     Motor: No weakness.  Psychiatric:        Mood and Affect: Mood and affect normal.        Behavior: Behavior normal.     BP (!) 129/57   Pulse 74   Ht 5\' 4"  (1.626 m)   Wt 141 lb 14.4 oz (64.4 kg)   BMI 24.36 kg/m  Wt Readings from Last 3 Encounters:  09/16/20 141 lb 14.4 oz (64.4 kg)  07/19/20 144 lb (65.3 kg)  04/16/20 144 lb (65.3 kg)    Health Maintenance Due  Topic Date Due  . TETANUS/TDAP  Never done  . DEXA SCAN  Never done    There are no preventive care reminders to display  for this patient.  CBC Latest Ref Rng & Units 11/16/2017 07/12/2017 03/08/2017  WBC 3.6 - 11.0 K/uL 5.8 4.4 4.4  Hemoglobin 12.0 - 16.0 g/dL 14.0 12.4 11.9(L)  Hematocrit 35 - 47 % 42.8 38.7 38.4  Platelets 150 - 440 K/uL 172 158 165   CMP Latest Ref Rng & Units 11/16/2017 07/12/2017 03/08/2017  Glucose 65 - 99 mg/dL 110(H) 104(H) 87  BUN 6 - 20 mg/dL 16 14 18   Creatinine 0.44 - 1.00 mg/dL 0.80 0.79 0.74  Sodium 135 - 145 mmol/L 138 138 138  Potassium 3.5 - 5.1 mmol/L 4.3 4.4 4.0  Chloride 101 - 111 mmol/L 105 104 104  CO2 22 - 32 mmol/L 24 27 27   Calcium 8.9 - 10.3 mg/dL 9.2 9.2 9.6  Total Protein 6.5 - 8.1 g/dL 7.7 6.9 7.2  Total Bilirubin 0.3 - 1.2 mg/dL 0.6 0.6 0.7  Alkaline Phos 38 - 126 U/L 95 90 89  AST 15 - 41 U/L 22 22 23   ALT 14 - 54 U/L 16 15 17     No results found for: TSH Lab Results  Component Value Date   ALBUMIN 4.1 11/16/2017   ANIONGAP 9 11/16/2017   No results found for: CHOL, HDL, LDLCALC, CHOLHDL No results found for: TRIG No results found for: HGBA1C    ASSESSMENT & PLAN:   Problem List Items Addressed This Visit      Cardiovascular and Mediastinum   Mitral valve disease    Heart regular Rhythm and  Rate. 2/6 MV murmur.         Respiratory   Chronic bronchitis (HCC)    Currently on Prednisone, wheezing without acute sob, has been using Neb Q HS.         Other   Iron deficiency anemia due to chronic blood loss    Taking Iron daily, no constipation reported, denies fatigue.       Relevant Orders   Iron   CBC w/Diff/Platelet   Comprehensive Metabolic Panel (CMET)   Annual physical exam - Primary    Flu vaccine given today, all other prevention not age appropriate at this time.         No orders of the defined types were placed in this encounter.   Follow-up: No follow-ups on file.    Beckie Salts, [facility] [address] [place], [place] [address]   By signing my name below, I, [employer], attest that this  documentation has been prepared under the direction and in the presence of Beckie Salts, FNP Electronically Signed: Beckie Salts, [place] 10/07/20, 10:13 AM

## 2020-09-16 NOTE — Assessment & Plan Note (Signed)
Currently on Prednisone, wheezing without acute sob, has been using Neb Q HS.

## 2020-09-16 NOTE — Assessment & Plan Note (Signed)
Heart regular Rhythm and Rate. 2/6 MV murmur.

## 2020-09-16 NOTE — Assessment & Plan Note (Signed)
Flu vaccine given today, all other prevention not age appropriate at this time.

## 2020-10-07 DIAGNOSIS — L900 Lichen sclerosus et atrophicus: Secondary | ICD-10-CM | POA: Diagnosis not present

## 2020-11-05 ENCOUNTER — Ambulatory Visit (INDEPENDENT_AMBULATORY_CARE_PROVIDER_SITE_OTHER): Payer: Medicare Other

## 2020-11-05 DIAGNOSIS — Z23 Encounter for immunization: Secondary | ICD-10-CM | POA: Diagnosis not present

## 2020-12-07 ENCOUNTER — Other Ambulatory Visit: Payer: Self-pay

## 2020-12-07 DIAGNOSIS — H35342 Macular cyst, hole, or pseudohole, left eye: Secondary | ICD-10-CM | POA: Diagnosis not present

## 2020-12-07 DIAGNOSIS — D509 Iron deficiency anemia, unspecified: Secondary | ICD-10-CM

## 2020-12-07 MED ORDER — ATORVASTATIN CALCIUM 10 MG PO TABS: 10.0000 mg | ORAL_TABLET | Freq: Every day | ORAL | 1 refills | Status: DC

## 2020-12-08 ENCOUNTER — Other Ambulatory Visit: Payer: Self-pay

## 2020-12-14 DIAGNOSIS — H26492 Other secondary cataract, left eye: Secondary | ICD-10-CM | POA: Diagnosis not present

## 2020-12-17 ENCOUNTER — Other Ambulatory Visit: Payer: Self-pay

## 2020-12-17 ENCOUNTER — Ambulatory Visit (INDEPENDENT_AMBULATORY_CARE_PROVIDER_SITE_OTHER): Payer: Medicare Other | Admitting: Family Medicine

## 2020-12-17 ENCOUNTER — Encounter: Payer: Self-pay | Admitting: Family Medicine

## 2020-12-17 DIAGNOSIS — J42 Unspecified chronic bronchitis: Secondary | ICD-10-CM

## 2020-12-17 MED ORDER — ALBUTEROL SULFATE HFA 108 (90 BASE) MCG/ACT IN AERS: 2.0000 | INHALATION_SPRAY | Freq: Four times a day (QID) | RESPIRATORY_TRACT | 3 refills | Status: DC | PRN

## 2020-12-17 MED ORDER — FLUTICASONE-SALMETEROL 250-50 MCG/DOSE IN AEPB
1.0000 | INHALATION_SPRAY | Freq: Two times a day (BID) | RESPIRATORY_TRACT | 3 refills | Status: DC
Start: 2020-12-17 — End: 2022-09-29

## 2020-12-17 NOTE — Progress Notes (Signed)
Established Patient Office Visit  SUBJECTIVE:  Subjective  Patient ID: Daisy Holloway, female    DOB: 05/02/1929  Age: 87 y.o. MRN: [id number]  CC: No chief complaint on file.   HPI Daisy Holloway is a 87 y.o. female presenting today for     Past Medical History:  Diagnosis Date  . Anemia    IDA  . Arthritis   . Asthma   . GERD (gastroesophageal reflux disease)   . Iron deficiency anemia due to chronic blood loss 04/01/2015  . Osteoporosis     Past Surgical History:  Procedure Laterality Date  . ABDOMINAL HYSTERECTOMY  1964   ovaries left in  . BREAST BIOPSY Right 1998   patient states calcifications-benign  . CATARACT EXTRACTION     one eye in 2007 and the other eye 2012  . CHOLECYSTECTOMY    . KNEE SURGERY Right 2004  . KNEE SURGERY Left 2012  . SHOULDER SURGERY  1995    Family History  Problem Relation Age of Onset  . Liver cancer Mother   . Arthritis Mother   . Gastric cancer Father   . Asthma Father   . Diabetes Sister   . Diabetes Brother   . Breast cancer Maternal Aunt   . Breast cancer Cousin     Social History   Socioeconomic History  . Marital status: Married    Spouse name: Not on file  . Number of children: Not on file  . Years of education: Not on file  . Highest education level: Not on file  Occupational History  . Not on file  Tobacco Use  . Smoking status: Never Smoker  . Smokeless tobacco: Never Used  Vaping Use  . Vaping Use: Never used  Substance and Sexual Activity  . Alcohol use: Yes    Comment: occ. wine  . Drug use: No  . Sexual activity: Not on file  Other Topics Concern  . Not on file  Social History Narrative  . Not on file   Social Determinants of Health   Financial Resource Strain: Not on file  Food Insecurity: Not on file  Transportation Needs: Not on file  Physical Activity: Not on file  Stress: Not on file  Social Connections: Not on file  Intimate Partner Violence: Not on file     Current  Outpatient Medications:  .  albuterol (PROAIR HFA) 108 (90 Base) MCG/ACT inhaler, Inhale 2 puffs into the lungs 4 (four) times daily as needed. 2 puffs, Disp: 1 each, Rfl: 3 .  ALPRAZolam (XANAX) 0.25 MG tablet, Take 1 tablet (0.25 mg total) by mouth 2 (two) times daily as needed for anxiety., Disp: 20 tablet, Rfl: 0 .  aspirin EC 81 MG tablet, Take 81 mg by mouth daily., Disp: , Rfl:  .  atorvastatin (LIPITOR) 10 MG tablet, Take 1 tablet (10 mg total) by mouth daily., Disp: 90 tablet, Rfl: 1 .  Cholecalciferol (VITAMIN D3) 2000 UNITS capsule, Take 5,000 Units by mouth daily. , Disp: , Rfl:  .  diltiazem (CARDIZEM CD) 120 MG 24 hr capsule, Take 1 capsule (120 mg total) by mouth daily., Disp: 90 capsule, Rfl: 3 .  fluticasone (FLONASE) 50 MCG/ACT nasal spray, Place into the nose., Disp: , Rfl:  .  Fluticasone-Salmeterol (WIXELA INHUB) 250-50 MCG/DOSE AEPB, Inhale 1 puff into the lungs 2 (two) times daily., Disp: 180 each, Rfl: 3 .  Glycerin-Hypromellose-PEG 400 ([facility] DRY EYE RELIEF) 0.2-0.2-1 % SOLN, Apply to eye., Disp: , Rfl:  .  ipratropium-albuterol (DUONEB) 0.5-2.5 (3) MG/3ML SOLN, See admin instructions., Disp: , Rfl: 8 .  loratadine (CLARITIN) 10 MG tablet, Take 10 mg by mouth daily. Reported on 06/13/2016, Disp: , Rfl:  .  Multiple Vitamin (MULTI-VITAMINS) TABS, Take by mouth. Reported on 06/13/2016, Disp: , Rfl:  .  nystatin-triamcinolone (MYCOLOG II) cream, APPLY TO AFFECTED AREA TWICE A DAY, Disp: , Rfl: 5 .  Omega-3 Fatty Acids (FISH OIL) 1000 MG CPDR, Take by mouth., Disp: , Rfl:  .  omeprazole (PRILOSEC) 20 MG capsule, Take 20 mg by mouth daily as needed., Disp: , Rfl:  .  sodium chloride (OCEAN) 0.65 % nasal spray, Place into the nose., Disp: , Rfl:  .  Umeclidinium-Vilanterol (ANORO ELLIPTA IN), Inhale 1 puff into the lungs daily., Disp: , Rfl:  .  vitamin B-12 (CYANOCOBALAMIN) 1000 MCG tablet, Take by mouth., Disp: , Rfl:    Allergies  Allergen Reactions  . Aspirin Nausea Only     Other reaction(s): Distress (finding) Other reaction(s): Distress (finding)  . Sulfa Antibiotics Hives    Other reaction(s): Unknown Other reaction(s): Unknown    ROS Review of Systems  Constitutional: Negative.   HENT: Negative.   Respiratory: Positive for shortness of breath.   Gastrointestinal: Negative.   Genitourinary: Negative.   Neurological: Negative.   Psychiatric/Behavioral: Negative.   All other systems reviewed and are negative.    OBJECTIVE:    Physical Exam Vitals and nursing note reviewed.  HENT:     Head: Normocephalic.     Nose: Nose normal.     Mouth/Throat:     Mouth: Mucous membranes are dry.  Cardiovascular:     Rate and Rhythm: Normal rate and regular rhythm.  Pulmonary:     Effort: Pulmonary effort is normal.  Musculoskeletal:        General: Normal range of motion.     Cervical back: Normal range of motion.  Neurological:     Mental Status: She is alert.  Psychiatric:        Mood and Affect: Mood normal.     BP (!) 117/58   Pulse 76   Temp 97.6 F (36.4 C)   Resp 16   Ht 5\' 3"  (1.6 m)   SpO2 92%   BMI 25.14 kg/m  Wt Readings from Last 3 Encounters:  09/16/20 141 lb 14.4 oz (64.4 kg)  07/19/20 144 lb (65.3 kg)  04/16/20 144 lb (65.3 kg)    Health Maintenance Due  Topic Date Due  . TETANUS/TDAP  Never done  . DEXA SCAN  Never done    There are no preventive care reminders to display for this patient.  CBC Latest Ref Rng & Units 11/16/2017 07/12/2017 03/08/2017  WBC 3.6 - 11.0 K/uL 5.8 4.4 4.4  Hemoglobin 12.0 - 16.0 g/dL 14.0 12.4 11.9(L)  Hematocrit 35.0 - 47.0 % 42.8 38.7 38.4  Platelets 150 - 440 K/uL 172 158 165   CMP Latest Ref Rng & Units 11/16/2017 07/12/2017 03/08/2017  Glucose 65 - 99 mg/dL 110(H) 104(H) 87  BUN 6 - 20 mg/dL 16 14 18   Creatinine 0.44 - 1.00 mg/dL 0.80 0.79 0.74  Sodium 135 - 145 mmol/L 138 138 138  Potassium 3.5 - 5.1 mmol/L 4.3 4.4 4.0  Chloride 101 - 111 mmol/L 105 104 104  CO2 22 - 32 mmol/L 24 27  27   Calcium 8.9 - 10.3 mg/dL 9.2 9.2 9.6  Total Protein 6.5 - 8.1 g/dL 7.7 6.9 7.2  Total Bilirubin 0.3 - 1.2  mg/dL 0.6 0.6 0.7  Alkaline Phos 38 - 126 U/L 95 90 89  AST 15 - 41 U/L 22 22 23   ALT 14 - 54 U/L 16 15 17     No results found for: TSH Lab Results  Component Value Date   ALBUMIN 4.1 11/16/2017   ANIONGAP 9 11/16/2017   No results found for: CHOL, HDL, LDLCALC, CHOLHDL No results found for: TRIG No results found for: HGBA1C    ASSESSMENT & PLAN:   Problem List Items Addressed This Visit      Respiratory   Chronic bronchitis ([place])    Pt with intermittent SOB says that it is always worse in the winter. Refilled medication today.          Meds ordered this encounter  Medications  . Fluticasone-Salmeterol (WIXELA INHUB) 250-50 MCG/DOSE AEPB    Sig: Inhale 1 puff into the lungs 2 (two) times daily.    Dispense:  180 each    Refill:  3    Requesting 1 year supply  . albuterol (PROAIR HFA) 108 (90 Base) MCG/ACT inhaler    Sig: Inhale 2 puffs into the lungs 4 (four) times daily as needed. 2 puffs    Dispense:  1 each    Refill:  3      Follow-up: No follow-ups on file.    Beckie Salts, [facility] [address] [place], [place] [address]

## 2020-12-17 NOTE — Assessment & Plan Note (Signed)
Pt with intermittent SOB says that it is always worse in the winter. Refilled medication today.

## 2020-12-29 DIAGNOSIS — H26492 Other secondary cataract, left eye: Secondary | ICD-10-CM | POA: Diagnosis not present

## 2021-01-13 ENCOUNTER — Other Ambulatory Visit: Payer: Self-pay | Admitting: Internal Medicine

## 2021-02-04 ENCOUNTER — Other Ambulatory Visit: Payer: Self-pay | Admitting: *Deleted

## 2021-02-04 MED ORDER — IPRATROPIUM-ALBUTEROL 0.5-2.5 (3) MG/3ML IN SOLN: 3.0000 mL | RESPIRATORY_TRACT | 8 refills | Status: DC

## 2021-03-17 ENCOUNTER — Other Ambulatory Visit: Payer: Self-pay

## 2021-03-17 ENCOUNTER — Encounter: Payer: Self-pay | Admitting: Internal Medicine

## 2021-03-17 ENCOUNTER — Ambulatory Visit (INDEPENDENT_AMBULATORY_CARE_PROVIDER_SITE_OTHER): Payer: Medicare Other | Admitting: Internal Medicine

## 2021-03-17 VITALS — BP 113/67 | HR 77 | Ht 63.0 in | Wt 136.8 lb

## 2021-03-17 DIAGNOSIS — J42 Unspecified chronic bronchitis: Secondary | ICD-10-CM | POA: Diagnosis not present

## 2021-03-17 DIAGNOSIS — R042 Hemoptysis: Secondary | ICD-10-CM | POA: Diagnosis not present

## 2021-03-17 DIAGNOSIS — F411 Generalized anxiety disorder: Secondary | ICD-10-CM

## 2021-03-17 DIAGNOSIS — F430 Acute stress reaction: Secondary | ICD-10-CM

## 2021-03-17 DIAGNOSIS — D500 Iron deficiency anemia secondary to blood loss (chronic): Secondary | ICD-10-CM | POA: Diagnosis not present

## 2021-03-17 NOTE — Assessment & Plan Note (Signed)
Patient comfortable pinkish frothy sputum today we will get a chest x-ray to evaluate lungs.  Also get a CBC and metabolic panel.  She has lost about 30 pounds of weight.  She is non-smoker.

## 2021-03-17 NOTE — Assessment & Plan Note (Signed)
-   Patient experiencing high levels of anxiety.  - Encouraged patient to engage in relaxing activities like yoga, meditation, journaling, going for a walk, or participating in a hobby.  - Encouraged patient to reach out to trusted friends or family members about recent struggles 

## 2021-03-17 NOTE — Progress Notes (Signed)
Established Patient Office Visit  Subjective:  Patient ID: Daisy Holloway, female    DOB: 05/02/1929  Age: 87 y.o. MRN: [id number]  CC:  Chief Complaint  Patient presents with  . Follow-up    HPI  Daisy Holloway  She coughed up some pinkish color sputum today.  She does not have any fever or chills.  Patient is not a smoker.  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  She has lost about 30 pounds of weight.  We will get a chest x-ray and CBC and metabolic panel. Past Medical History:  Diagnosis Date  . Anemia    IDA  . Arthritis   . Asthma   . GERD (gastroesophageal reflux disease)   . Iron deficiency anemia due to chronic blood loss 04/01/2015  . Osteoporosis     Past Surgical History:  Procedure Laterality Date  . ABDOMINAL HYSTERECTOMY  1964   ovaries left in  . BREAST BIOPSY Right 1998   patient states calcifications-benign  . CATARACT EXTRACTION     one eye in 2007 and the other eye 2012  . CHOLECYSTECTOMY    . KNEE SURGERY Right 2004  . KNEE SURGERY Left 2012  . SHOULDER SURGERY  1995    Family History  Problem Relation Age of Onset  . Liver cancer Mother   . Arthritis Mother   . Gastric cancer Father   . Asthma Father   . Diabetes Sister   . Diabetes Brother   . Breast cancer Maternal Aunt   . Breast cancer Cousin     Social History   Socioeconomic History  . Marital status: Married    Spouse name: Not on file  . Number of children: Not on file  . Years of education: Not on file  . Highest education level: Not on file  Occupational History  . Not on file  Tobacco Use  . Smoking status: Never Smoker  . Smokeless tobacco: Never Used  Vaping Use  . Vaping Use: Never used  Substance and Sexual Activity  . Alcohol use: Yes    Comment: occ. wine  . Drug use: No  . Sexual activity: Not on file  Other Topics Concern  . Not on file  Social History Narrative  . Not on file   Social Determinants of Health   Financial Resource Strain: Not on file   Food Insecurity: Not on file  Transportation Needs: Not on file  Physical Activity: Not on file  Stress: Not on file  Social Connections: Not on file  Intimate Partner Violence: Not on file     Current Outpatient Medications:  .  ALPRAZolam (XANAX) 0.25 MG tablet, Take 1 tablet (0.25 mg total) by mouth 2 (two) times daily as needed for anxiety., Disp: 20 tablet, Rfl: 0 .  aspirin EC 81 MG tablet, Take 81 mg by mouth daily., Disp: , Rfl:  .  atorvastatin (LIPITOR) 10 MG tablet, Take 1 tablet (10 mg total) by mouth daily., Disp: 90 tablet, Rfl: 1 .  Cholecalciferol (VITAMIN D3) 2000 UNITS capsule, Take 5,000 Units by mouth daily. , Disp: , Rfl:  .  diltiazem (CARDIZEM CD) 120 MG 24 hr capsule, Take 1 capsule (120 mg total) by mouth daily., Disp: 90 capsule, Rfl: 3 .  fluticasone (FLONASE) 50 MCG/ACT nasal spray, Place into the nose., Disp: , Rfl:  .  Fluticasone-Salmeterol (WIXELA INHUB) 250-50 MCG/DOSE AEPB, Inhale 1 puff into the lungs 2 (two) times daily., Disp: 180 each, Rfl: 3 .  Glycerin-Hypromellose-PEG 400 0.2-0.2-1 % SOLN, Apply to eye., Disp: , Rfl:  .  ipratropium-albuterol (DUONEB) 0.5-2.5 (3) MG/3ML SOLN, Take 3 mLs by nebulization See admin instructions., Disp: 360 mL, Rfl: 8 .  loratadine (CLARITIN) 10 MG tablet, Take 10 mg by mouth daily. Reported on 06/13/2016, Disp: , Rfl:  .  Multiple Vitamin (MULTI-VITAMINS) TABS, Take by mouth. Reported on 06/13/2016, Disp: , Rfl:  .  nystatin-triamcinolone (MYCOLOG II) cream, APPLY TO AFFECTED AREA TWICE A DAY, Disp: , Rfl: 5 .  Omega-3 Fatty Acids (FISH OIL) 1000 MG CPDR, Take by mouth., Disp: , Rfl:  .  omeprazole (PRILOSEC) 20 MG capsule, TAKE 1 CAPSULE BY MOUTH  TWICE DAILY, Disp: 180 capsule, Rfl: 3 .  sodium chloride (OCEAN) 0.65 % nasal spray, Place into the nose., Disp: , Rfl:  .  Umeclidinium-Vilanterol (ANORO ELLIPTA IN), Inhale 1 puff into the lungs daily., Disp: , Rfl:  .  vitamin B-12 (CYANOCOBALAMIN) 1000 MCG tablet, Take  by mouth., Disp: , Rfl:  .  albuterol (PROAIR HFA) 108 (90 Base) MCG/ACT inhaler, Inhale 2 puffs into the lungs 4 (four) times daily as needed. 2 puffs, Disp: 1 each, Rfl: 3   Allergies  Allergen Reactions  . Aspirin Nausea Only    Other reaction(s): Distress (finding) Other reaction(s): Distress (finding)  . Sulfa Antibiotics Hives    Other reaction(s): Unknown Other reaction(s): Unknown    ROS Review of Systems  Constitutional: Negative.   HENT: Negative.  Negative for facial swelling and hearing loss.   Eyes: Negative.   Respiratory: Negative.  Negative for chest tightness.   Cardiovascular: Negative.   Gastrointestinal: Negative.   Endocrine: Negative.   Genitourinary: Negative.   Musculoskeletal: Positive for arthralgias.  Skin: Negative.   Allergic/Immunologic: Negative.   Neurological: Negative.  Negative for dizziness and headaches.  Hematological: Negative.   Psychiatric/Behavioral: The patient is nervous/anxious and is hyperactive.   All other systems reviewed and are negative.     Objective:    Physical Exam Cardiovascular:     Rate and Rhythm: Normal rate.     Heart sounds: No murmur heard.   Pulmonary:     Breath sounds: No wheezing, rhonchi or rales.  Abdominal:     Palpations: Abdomen is soft.  Skin:    General: Skin is warm.  Neurological:     General: No focal deficit present.  Psychiatric:        Behavior: Behavior normal.     BP 113/67   Pulse 77   Ht 5\' 3"  (1.6 m)   Wt 136 lb 12.8 oz (62.1 kg)   BMI 24.23 kg/m  Wt Readings from Last 3 Encounters:  03/17/21 136 lb 12.8 oz (62.1 kg)  12/17/20 138 lb (62.6 kg)  09/16/20 141 lb 14.4 oz (64.4 kg)     Health Maintenance Due  Topic Date Due  . TETANUS/TDAP  Never done  . DEXA SCAN  Never done    There are no preventive care reminders to display for this patient.  No results found for: TSH Lab Results  Component Value Date   WBC 5.8 11/16/2017   HGB 14.0 11/16/2017   HCT 42.8  11/16/2017   MCV 84.5 11/16/2017   PLT 172 11/16/2017   Lab Results  Component Value Date   NA 138 11/16/2017   K 4.3 11/16/2017   CO2 24 11/16/2017   GLUCOSE 110 (H) 11/16/2017   BUN 16 11/16/2017   CREATININE 0.80 11/16/2017   BILITOT 0.6 11/16/2017  ALKPHOS 95 11/16/2017   AST 22 11/16/2017   ALT 16 11/16/2017   PROT 7.7 11/16/2017   ALBUMIN 4.1 11/16/2017   CALCIUM 9.2 11/16/2017   ANIONGAP 9 11/16/2017   No results found for: CHOL No results found for: HDL No results found for: LDLCALC No results found for: TRIG No results found for: CHOLHDL No results found for: HGBA1C    Assessment & Plan:   Problem List Items Addressed This Visit      Respiratory   Chronic bronchitis ([place])    Patient comfortable pinkish frothy sputum today we will get a chest x-ray to evaluate lungs.  Also get a CBC and metabolic panel.  She has lost about 30 pounds of weight.  She is non-smoker.        Other   Iron deficiency anemia due to chronic blood loss    We will check CBC metabolic panel      Anxiety as acute reaction to exceptional stress    - Patient experiencing high levels of anxiety.  - Encouraged patient to engage in relaxing activities like yoga, meditation, journaling, going for a walk, or participating in a hobby.  - Encouraged patient to reach out to trusted friends or family members about recent struggles       Other Visit Diagnoses    Coughing blood    -  Primary   Relevant Orders   CBC with Differential/Platelet   COMPLETE METABOLIC PANEL WITH GFR   DG Chest 2 View      No orders of the defined types were placed in this encounter.   Follow-up: No follow-ups on file.    Cletis Athens, MD

## 2021-03-17 NOTE — Assessment & Plan Note (Signed)
We will check CBC metabolic panel

## 2021-03-18 LAB — COMPLETE METABOLIC PANEL WITH GFR
AG Ratio: 1.7 (calc) (ref 1.0–2.5)
ALT: 13 U/L (ref 6–29)
AST: 21 U/L (ref 10–35)
Albumin: 4.2 g/dL (ref 3.6–5.1)
Alkaline phosphatase (APISO): 81 U/L (ref 37–153)
BUN: 15 mg/dL (ref 7–25)
CO2: 28 mmol/L (ref 20–32)
Calcium: 9.7 mg/dL (ref 8.6–10.4)
Chloride: 106 mmol/L (ref 98–110)
Creat: 0.85 mg/dL (ref 0.60–0.88)
GFR, Est African American: 70 mL/min/{1.73_m2} (ref >=60)
GFR, Est Non African American: 61 mL/min/{1.73_m2} (ref >=60)
Globulin: 2.5 g/dL (calc) (ref 1.9–3.7)
Glucose, Bld: 128 mg/dL — ABNORMAL HIGH (ref 65–99)
Potassium: 4.9 mmol/L (ref 3.5–5.3)
Sodium: 143 mmol/L (ref 135–146)
Total Bilirubin: 0.4 mg/dL (ref 0.2–1.2)
Total Protein: 6.7 g/dL (ref 6.1–8.1)

## 2021-03-18 LAB — CBC WITH DIFFERENTIAL/PLATELET
Absolute Monocytes: 389 cells/uL (ref 200–950)
Basophils Absolute: 19 cells/uL (ref 0–200)
Basophils Relative: 0.5 %
Eosinophils Absolute: 178 cells/uL (ref 15–500)
Eosinophils Relative: 4.8 %
HCT: 39.1 % (ref 35.0–45.0)
Hemoglobin: 12.2 g/dL (ref 11.7–15.5)
Lymphs Abs: 1203 cells/uL (ref 850–3900)
MCH: 26.2 pg — ABNORMAL LOW (ref 27.0–33.0)
MCHC: 31.2 g/dL — ABNORMAL LOW (ref 32.0–36.0)
MCV: 83.9 fL (ref 80.0–100.0)
MPV: 11.8 fL (ref 7.5–12.5)
Monocytes Relative: 10.5 %
Neutro Abs: 1913 cells/uL (ref 1500–7800)
Neutrophils Relative %: 51.7 %
Platelets: 158 10*3/uL (ref 140–400)
RBC: 4.66 10*6/uL (ref 3.80–5.10)
RDW: 15.3 % — ABNORMAL HIGH (ref 11.0–15.0)
Total Lymphocyte: 32.5 %
WBC: 3.7 10*3/uL — ABNORMAL LOW (ref 3.8–10.8)

## 2021-03-22 ENCOUNTER — Other Ambulatory Visit: Payer: Self-pay | Admitting: Family Medicine

## 2021-03-24 ENCOUNTER — Other Ambulatory Visit: Payer: Self-pay

## 2021-03-24 ENCOUNTER — Ambulatory Visit
Admission: RE | Admit: 2021-03-24 | Discharge: 2021-03-24 | Disposition: A | Discharge status: Home / Self Care | Payer: Medicare Other | Source: Ambulatory Visit | Attending: Internal Medicine | Admitting: Internal Medicine

## 2021-03-24 ENCOUNTER — Ambulatory Visit: Payer: Medicare Other | Admitting: Family Medicine

## 2021-03-24 ENCOUNTER — Ambulatory Visit
Admission: RE | Admit: 2021-03-24 | Discharge: 2021-03-24 | Disposition: A | Discharge status: Home / Self Care | Payer: Medicare Other | Attending: Internal Medicine | Admitting: Internal Medicine

## 2021-03-24 DIAGNOSIS — R042 Hemoptysis: Secondary | ICD-10-CM | POA: Insufficient documentation

## 2021-03-24 DIAGNOSIS — J439 Emphysema, unspecified: Secondary | ICD-10-CM | POA: Diagnosis not present

## 2021-03-24 DIAGNOSIS — K449 Diaphragmatic hernia without obstruction or gangrene: Secondary | ICD-10-CM | POA: Diagnosis not present

## 2021-03-24 DIAGNOSIS — R059 Cough, unspecified: Secondary | ICD-10-CM | POA: Diagnosis not present

## 2021-03-25 ENCOUNTER — Ambulatory Visit (INDEPENDENT_AMBULATORY_CARE_PROVIDER_SITE_OTHER): Payer: Medicare Other | Admitting: Internal Medicine

## 2021-03-25 ENCOUNTER — Other Ambulatory Visit: Payer: Self-pay | Admitting: *Deleted

## 2021-03-25 ENCOUNTER — Encounter: Payer: Self-pay | Admitting: Internal Medicine

## 2021-03-25 VITALS — BP 127/61 | HR 72 | Ht 64.0 in | Wt 135.5 lb

## 2021-03-25 DIAGNOSIS — K219 Gastro-esophageal reflux disease without esophagitis: Secondary | ICD-10-CM | POA: Diagnosis not present

## 2021-03-25 DIAGNOSIS — F430 Acute stress reaction: Secondary | ICD-10-CM

## 2021-03-25 DIAGNOSIS — R5382 Chronic fatigue, unspecified: Secondary | ICD-10-CM | POA: Diagnosis not present

## 2021-03-25 DIAGNOSIS — D500 Iron deficiency anemia secondary to blood loss (chronic): Secondary | ICD-10-CM

## 2021-03-25 DIAGNOSIS — J42 Unspecified chronic bronchitis: Secondary | ICD-10-CM | POA: Diagnosis not present

## 2021-03-25 DIAGNOSIS — F411 Generalized anxiety disorder: Secondary | ICD-10-CM

## 2021-03-25 DIAGNOSIS — D509 Iron deficiency anemia, unspecified: Secondary | ICD-10-CM

## 2021-03-25 MED ORDER — ALPRAZOLAM 0.25 MG PO TABS: 0.2500 mg | ORAL_TABLET | Freq: Two times a day (BID) | ORAL | 0 refills | Status: DC | PRN

## 2021-03-25 MED ORDER — FLUTICASONE PROPIONATE 50 MCG/ACT NA SUSP: 1.0000 | Freq: Two times a day (BID) | NASAL | 3 refills | Status: DC

## 2021-03-25 NOTE — Assessment & Plan Note (Signed)
Is a chronic problem due to anxiety

## 2021-03-25 NOTE — Progress Notes (Signed)
Established Patient Office Visit  Subjective:  Patient ID: Daisy Holloway, female    DOB: 05/02/1929  Age: 87 y.o. MRN: [id number]  CC:  Chief Complaint  Patient presents with  . chest xray results     HPI  Daisy Holloway presents x-ray report she also complains of sore throat and also neck to be checked.  She denies any history of fever or chills.  No nausea vomiting swelling of the legs, chest x-ray revealed chronic emphysema.  No infiltrate was noted.  She also complains of extreme anxiety lately  Past Medical History:  Diagnosis Date  . Anemia    IDA  . Arthritis   . Asthma   . GERD (gastroesophageal reflux disease)   . Iron deficiency anemia due to chronic blood loss 04/01/2015  . Osteoporosis     Past Surgical History:  Procedure Laterality Date  . ABDOMINAL HYSTERECTOMY  1964   ovaries left in  . BREAST BIOPSY Right 1998   patient states calcifications-benign  . CATARACT EXTRACTION     one eye in 2007 and the other eye 2012  . CHOLECYSTECTOMY    . KNEE SURGERY Right 2004  . KNEE SURGERY Left 2012  . SHOULDER SURGERY  1995    Family History  Problem Relation Age of Onset  . Liver cancer Mother   . Arthritis Mother   . Gastric cancer Father   . Asthma Father   . Diabetes Sister   . Diabetes Brother   . Breast cancer Maternal Aunt   . Breast cancer Cousin     Social History   Socioeconomic History  . Marital status: Married    Spouse name: Not on file  . Number of children: Not on file  . Years of education: Not on file  . Highest education level: Not on file  Occupational History  . Not on file  Tobacco Use  . Smoking status: Never Smoker  . Smokeless tobacco: Never Used  Vaping Use  . Vaping Use: Never used  Substance and Sexual Activity  . Alcohol use: Yes    Comment: occ. wine  . Drug use: No  . Sexual activity: Not on file  Other Topics Concern  . Not on file  Social History Narrative  . Not on file   Social Determinants of Health    Financial Resource Strain: Not on file  Food Insecurity: Not on file  Transportation Needs: Not on file  Physical Activity: Not on file  Stress: Not on file  Social Connections: Not on file  Intimate Partner Violence: Not on file     Current Outpatient Medications:  .  albuterol (VENTOLIN HFA) 108 (90 Base) MCG/ACT inhaler, USE 2 INHALATIONS BY MOUTH  INTO THE LUNGS 4 TIMES  DAILY AS NEEDED, Disp: 34 g, Rfl: 3 .  aspirin EC 81 MG tablet, Take 81 mg by mouth daily., Disp: , Rfl:  .  atorvastatin (LIPITOR) 10 MG tablet, Take 1 tablet (10 mg total) by mouth daily., Disp: 90 tablet, Rfl: 1 .  Cholecalciferol (VITAMIN D3) 2000 UNITS capsule, Take 5,000 Units by mouth daily. , Disp: , Rfl:  .  diltiazem (CARDIZEM CD) 120 MG 24 hr capsule, Take 1 capsule (120 mg total) by mouth daily., Disp: 90 capsule, Rfl: 3 .  fluticasone (FLONASE) 50 MCG/ACT nasal spray, Place 1 spray into both nostrils in the morning and at bedtime., Disp: 48 g, Rfl: 3 .  Fluticasone-Salmeterol (WIXELA INHUB) 250-50 MCG/DOSE AEPB, Inhale 1 puff  into the lungs 2 (two) times daily., Disp: 180 each, Rfl: 3 .  Glycerin-Hypromellose-PEG 400 0.2-0.2-1 % SOLN, Apply to eye., Disp: , Rfl:  .  ipratropium-albuterol (DUONEB) 0.5-2.5 (3) MG/3ML SOLN, Take 3 mLs by nebulization See admin instructions., Disp: 360 mL, Rfl: 8 .  loratadine (CLARITIN) 10 MG tablet, Take 10 mg by mouth daily. Reported on 06/13/2016, Disp: , Rfl:  .  Multiple Vitamin (MULTI-VITAMINS) TABS, Take by mouth. Reported on 06/13/2016, Disp: , Rfl:  .  nystatin-triamcinolone (MYCOLOG II) cream, APPLY TO AFFECTED AREA TWICE A DAY, Disp: , Rfl: 5 .  Omega-3 Fatty Acids (FISH OIL) 1000 MG CPDR, Take by mouth., Disp: , Rfl:  .  omeprazole (PRILOSEC) 20 MG capsule, TAKE 1 CAPSULE BY MOUTH  TWICE DAILY, Disp: 180 capsule, Rfl: 3 .  sodium chloride (OCEAN) 0.65 % nasal spray, Place into the nose., Disp: , Rfl:  .  Umeclidinium-Vilanterol (ANORO ELLIPTA IN), Inhale 1 puff  into the lungs daily., Disp: , Rfl:  .  vitamin B-12 (CYANOCOBALAMIN) 1000 MCG tablet, Take by mouth., Disp: , Rfl:  .  ALPRAZolam (XANAX) 0.25 MG tablet, Take 1 tablet (0.25 mg total) by mouth 2 (two) times daily as needed for anxiety., Disp: 20 tablet, Rfl: 0   Allergies  Allergen Reactions  . Aspirin Nausea Only    Other reaction(s): Distress (finding) Other reaction(s): Distress (finding)  . Sulfa Antibiotics Hives    Other reaction(s): Unknown Other reaction(s): Unknown    ROS Review of Systems  Constitutional: Negative.   HENT: Negative.   Eyes: Negative.   Respiratory: Negative.   Cardiovascular: Negative.   Gastrointestinal: Negative.   Endocrine: Negative.   Genitourinary: Negative.   Musculoskeletal: Negative.   Skin: Negative.   Allergic/Immunologic: Negative.   Neurological: Negative.   Hematological: Negative.   Psychiatric/Behavioral: Negative.   All other systems reviewed and are negative.     Objective:    Physical Exam Neck:     Vascular: No carotid bruit.  Cardiovascular:     Rate and Rhythm: Normal rate.     Pulses: Normal pulses.  Pulmonary:     Effort: Pulmonary effort is normal. No respiratory distress.     Breath sounds: Rhonchi present. No wheezing or rales.  Musculoskeletal:     Cervical back: Normal range of motion and neck supple.  Lymphadenopathy:     Cervical: No cervical adenopathy.  Neurological:     General: No focal deficit present.     BP 127/61   Pulse 72   Ht 5\' 4"  (1.626 m)   Wt 135 lb 8 oz (61.5 kg)   BMI 23.26 kg/m  Wt Readings from Last 3 Encounters:  03/25/21 135 lb 8 oz (61.5 kg)  03/17/21 136 lb 12.8 oz (62.1 kg)  12/17/20 138 lb (62.6 kg)     Health Maintenance Due  Topic Date Due  . TETANUS/TDAP  Never done  . DEXA SCAN  Never done    There are no preventive care reminders to display for this patient.  No results found for: TSH Lab Results  Component Value Date   WBC 3.7 (L) 03/17/2021   HGB  12.2 03/17/2021   HCT 39.1 03/17/2021   MCV 83.9 03/17/2021   PLT 158 03/17/2021   Lab Results  Component Value Date   NA 143 03/17/2021   K 4.9 03/17/2021   CO2 28 03/17/2021   GLUCOSE 128 (H) 03/17/2021   BUN 15 03/17/2021   CREATININE 0.85 03/17/2021   BILITOT  0.4 03/17/2021   ALKPHOS 95 11/16/2017   AST 21 03/17/2021   ALT 13 03/17/2021   PROT 6.7 03/17/2021   ALBUMIN 4.1 11/16/2017   CALCIUM 9.7 03/17/2021   ANIONGAP 9 11/16/2017   No results found for: CHOL No results found for: HDL No results found for: LDLCALC No results found for: TRIG No results found for: CHOLHDL No results found for: HGBA1C    Assessment & Plan:   Problem List Items Addressed This Visit      Respiratory   Chronic bronchitis ([place])    Chest x-ray reveals COPD with emphysema no infiltrate was noted.        Digestive   GERD (gastroesophageal reflux disease)    - The patient's GERD is stable on medication.  - Instructed the patient to avoid eating spicy and acidic foods, as well as foods high in fat. - Instructed the patient to avoid eating large meals or meals 2-3 hours prior to sleeping.  Patient was advised to take Prilosec 20 mg daily        Other   Iron deficiency anemia due to chronic blood loss    Continue using iron pill every day      Anxiety as acute reaction to exceptional stress - Primary    - Patient experiencing high levels of anxiety.  - Encouraged patient to engage in relaxing activities like yoga, meditation, journaling, going for a walk, or participating in a hobby.  - Encouraged patient to reach out to trusted friends or family members about recent struggles, patient is not depressed      Relevant Medications   ALPRAZolam (XANAX) 0.25 MG tablet   Chronic fatigue    Is a chronic problem due to anxiety      Relevant Medications   ALPRAZolam (XANAX) 0.25 MG tablet      Meds ordered this encounter  Medications  . ALPRAZolam (XANAX) 0.25 MG tablet    Sig:  Take 1 tablet (0.25 mg total) by mouth 2 (two) times daily as needed for anxiety.    Dispense:  20 tablet    Refill:  0    Follow-up: No follow-ups on file.    Cletis Athens, MD

## 2021-03-25 NOTE — Assessment & Plan Note (Signed)
-   Patient experiencing high levels of anxiety.  - Encouraged patient to engage in relaxing activities like yoga, meditation, journaling, going for a walk, or participating in a hobby.  - Encouraged patient to reach out to trusted friends or family members about recent struggles, patient is not depressed

## 2021-03-25 NOTE — Assessment & Plan Note (Signed)
Continue using iron pill every day

## 2021-03-25 NOTE — Assessment & Plan Note (Addendum)
-   The patient's GERD is stable on medication.  - Instructed the patient to avoid eating spicy and acidic foods, as well as foods high in fat. - Instructed the patient to avoid eating large meals or meals 2-3 hours prior to sleeping.  Patient was advised to take Prilosec 20 mg daily

## 2021-03-25 NOTE — Assessment & Plan Note (Signed)
Chest x-ray reveals COPD with emphysema no infiltrate was noted.

## 2021-04-26 ENCOUNTER — Other Ambulatory Visit: Payer: Self-pay | Admitting: Internal Medicine

## 2021-04-26 DIAGNOSIS — D509 Iron deficiency anemia, unspecified: Secondary | ICD-10-CM

## 2021-06-16 ENCOUNTER — Ambulatory Visit: Payer: Medicare Other | Admitting: Family Medicine

## 2021-06-23 ENCOUNTER — Ambulatory Visit (INDEPENDENT_AMBULATORY_CARE_PROVIDER_SITE_OTHER): Payer: Medicare Other | Admitting: Family Medicine

## 2021-06-23 ENCOUNTER — Encounter: Payer: Self-pay | Admitting: Family Medicine

## 2021-06-23 ENCOUNTER — Other Ambulatory Visit: Payer: Self-pay

## 2021-06-23 VITALS — BP 136/59 | HR 81 | Ht 64.0 in | Wt 133.2 lb

## 2021-06-23 DIAGNOSIS — R5382 Chronic fatigue, unspecified: Secondary | ICD-10-CM | POA: Diagnosis not present

## 2021-06-23 DIAGNOSIS — Z8616 Personal history of COVID-19: Secondary | ICD-10-CM | POA: Diagnosis not present

## 2021-06-23 MED ORDER — ALPRAZOLAM 0.25 MG PO TABS: 0.2500 mg | ORAL_TABLET | Freq: Two times a day (BID) | ORAL | 0 refills | Status: DC | PRN

## 2021-06-23 NOTE — Addendum Note (Signed)
Addended by: Beckie Salts on: 06/23/2021 03:05 PM   Modules accepted: Orders

## 2021-06-23 NOTE — Assessment & Plan Note (Signed)
Patient with Covid that has resolved. Her breathing has bean much better. No current symptoms.   Plan- Continue to monitor.

## 2021-06-23 NOTE — Progress Notes (Signed)
Established Patient Office Visit  SUBJECTIVE:  Subjective  Patient ID: Daisy Holloway, female    DOB: 05/02/1929  Age: 87 y.o. MRN: [id number]  CC:  Chief Complaint  Patient presents with   Follow-up    HPI Daisy Holloway is a 87 y.o. female presenting today for     Past Medical History:  Diagnosis Date   Anemia    IDA   Arthritis    Asthma    GERD (gastroesophageal reflux disease)    Iron deficiency anemia due to chronic blood loss 04/01/2015   Osteoporosis     Past Surgical History:  Procedure Laterality Date   ABDOMINAL HYSTERECTOMY  1964   ovaries left in   BREAST BIOPSY Right 1998   patient states calcifications-benign   CATARACT EXTRACTION     one eye in 2007 and the other eye 2012   CHOLECYSTECTOMY     KNEE SURGERY Right 2004   KNEE SURGERY Left 2012   SHOULDER SURGERY  1995    Family History  Problem Relation Age of Onset   Liver cancer Mother    Arthritis Mother    Gastric cancer Father    Asthma Father    Diabetes Sister    Diabetes Brother    Breast cancer Maternal Aunt    Breast cancer Cousin     Social History   Socioeconomic History   Marital status: Married    Spouse name: Not on file   Number of children: Not on file   Years of education: Not on file   Highest education level: Not on file  Occupational History   Not on file  Tobacco Use   Smoking status: Never   Smokeless tobacco: Never  Vaping Use   Vaping Use: Never used  Substance and Sexual Activity   Alcohol use: Yes    Comment: occ. wine   Drug use: No   Sexual activity: Not on file  Other Topics Concern   Not on file  Social History Narrative   Not on file   Social Determinants of Health   Financial Resource Strain: Not on file  Food Insecurity: Not on file  Transportation Needs: Not on file  Physical Activity: Not on file  Stress: Not on file  Social Connections: Not on file  Intimate Partner Violence: Not on file     Current Outpatient Medications:     albuterol (VENTOLIN HFA) 108 (90 Base) MCG/ACT inhaler, USE 2 INHALATIONS BY MOUTH  INTO THE LUNGS 4 TIMES  DAILY AS NEEDED, Disp: 34 g, Rfl: 3   ALPRAZolam (XANAX) 0.25 MG tablet, Take 1 tablet (0.25 mg total) by mouth 2 (two) times daily as needed for anxiety., Disp: 20 tablet, Rfl: 0   aspirin EC 81 MG tablet, Take 81 mg by mouth daily., Disp: , Rfl:    atorvastatin (LIPITOR) 10 MG tablet, TAKE 1 TABLET BY MOUTH  DAILY, Disp: 90 tablet, Rfl: 3   Cholecalciferol (VITAMIN D3) 2000 UNITS capsule, Take 5,000 Units by mouth daily. , Disp: , Rfl:    diltiazem (CARDIZEM CD) 120 MG 24 hr capsule, Take 1 capsule (120 mg total) by mouth daily., Disp: 90 capsule, Rfl: 3   fluticasone (FLONASE) 50 MCG/ACT nasal spray, Place 1 spray into both nostrils in the morning and at bedtime., Disp: 48 g, Rfl: 3   Fluticasone-Salmeterol (WIXELA INHUB) 250-50 MCG/DOSE AEPB, Inhale 1 puff into the lungs 2 (two) times daily., Disp: 180 each, Rfl: 3   Glycerin-Hypromellose-PEG 400 0.2-0.2-1 %  SOLN, Apply to eye., Disp: , Rfl:    ipratropium-albuterol (DUONEB) 0.5-2.5 (3) MG/3ML SOLN, Take 3 mLs by nebulization See admin instructions., Disp: 360 mL, Rfl: 8   loratadine (CLARITIN) 10 MG tablet, Take 10 mg by mouth daily. Reported on 06/13/2016, Disp: , Rfl:    Multiple Vitamin (MULTI-VITAMINS) TABS, Take by mouth. Reported on 06/13/2016, Disp: , Rfl:    nystatin-triamcinolone (MYCOLOG II) cream, APPLY TO AFFECTED AREA TWICE A DAY, Disp: , Rfl: 5   Omega-3 Fatty Acids (FISH OIL) 1000 MG CPDR, Take by mouth., Disp: , Rfl:    omeprazole (PRILOSEC) 20 MG capsule, TAKE 1 CAPSULE BY MOUTH  TWICE DAILY, Disp: 180 capsule, Rfl: 3   sodium chloride (OCEAN) 0.65 % nasal spray, Place into the nose., Disp: , Rfl:    Umeclidinium-Vilanterol (ANORO ELLIPTA IN), Inhale 1 puff into the lungs daily., Disp: , Rfl:    vitamin B-12 (CYANOCOBALAMIN) 1000 MCG tablet, Take by mouth., Disp: , Rfl:    Allergies  Allergen Reactions   Aspirin Nausea  Only    Other reaction(s): Distress (finding) Other reaction(s): Distress (finding)   Sulfa Antibiotics Hives    Other reaction(s): Unknown Other reaction(s): Unknown    ROS Review of Systems  Constitutional: Negative.   HENT: Negative.    Respiratory: Negative.    Cardiovascular: Negative.   Genitourinary: Negative.   Psychiatric/Behavioral: Negative.      OBJECTIVE:    Physical Exam HENT:     Right Ear: Tympanic membrane normal.  Cardiovascular:     Rate and Rhythm: Normal rate and regular rhythm.  Pulmonary:     Effort: Pulmonary effort is normal.  Musculoskeletal:        General: Normal range of motion.  Skin:    General: Skin is warm.  Neurological:     Mental Status: She is alert.  Psychiatric:        Mood and Affect: Mood normal.    BP (!) 136/59   Pulse 81   Ht 5\' 4"  (1.626 m)   Wt 133 lb 3.2 oz (60.4 kg)   BMI 22.86 kg/m  Wt Readings from Last 3 Encounters:  06/23/21 133 lb 3.2 oz (60.4 kg)  03/25/21 135 lb 8 oz (61.5 kg)  03/17/21 136 lb 12.8 oz (62.1 kg)    Health Maintenance Due  Topic Date Due   TETANUS/TDAP  Never done   Zoster Vaccines- Shingrix (1 of 2) Never done   DEXA SCAN  Never done   COVID-19 Vaccine (4 - Booster for Pfizer series) 12/09/2020    There are no preventive care reminders to display for this patient.  CBC Latest Ref Rng & Units 03/17/2021 11/16/2017 07/12/2017  WBC 3.8 - 10.8 Thousand/uL 3.7(L) 5.8 4.4  Hemoglobin 11.7 - 15.5 g/dL 12.2 14.0 12.4  Hematocrit 35.0 - 45.0 % 39.1 42.8 38.7  Platelets 140 - 400 Thousand/uL 158 172 158   CMP Latest Ref Rng & Units 03/17/2021 11/16/2017 07/12/2017  Glucose 65 - 99 mg/dL 128(H) 110(H) 104(H)  BUN 7 - 25 mg/dL 15 16 14   Creatinine 0.60 - 0.88 mg/dL 0.85 0.80 0.79  Sodium 135 - 146 mmol/L 143 138 138  Potassium 3.5 - 5.3 mmol/L 4.9 4.3 4.4  Chloride 98 - 110 mmol/L 106 105 104  CO2 20 - 32 mmol/L 28 24 27   Calcium 8.6 - 10.4 mg/dL 9.7 9.2 9.2  Total Protein 6.1 - 8.1 g/dL 6.7  7.7 6.9  Total Bilirubin 0.2 - 1.2 mg/dL 0.4 0.6  0.6  Alkaline Phos 38 - 126 U/L - 95 90  AST 10 - 35 U/L 21 22 22   ALT 6 - 29 U/L 13 16 15     No results found for: TSH Lab Results  Component Value Date   ALBUMIN 4.1 11/16/2017   ANIONGAP 9 11/16/2017   No results found for: CHOL, HDL, LDLCALC, CHOLHDL No results found for: TRIG No results found for: HGBA1C    ASSESSMENT & PLAN:   Problem List Items Addressed This Visit       Other   Post-COVID syndrome resolved - Primary    Patient with Covid that has resolved. Her breathing has bean much better. No current symptoms.   Plan- Continue to monitor.         No orders of the defined types were placed in this encounter.     Follow-up: No follow-ups on file.    Beckie Salts, [place] [address] [place], [place] [address]

## 2021-08-09 ENCOUNTER — Other Ambulatory Visit: Payer: Self-pay | Admitting: Internal Medicine

## 2021-08-09 ENCOUNTER — Other Ambulatory Visit: Payer: Self-pay | Admitting: *Deleted

## 2021-08-09 MED ORDER — ALPRAZOLAM 0.25 MG PO TABS: 0.2500 mg | ORAL_TABLET | Freq: Two times a day (BID) | ORAL | 0 refills | Status: DC | PRN

## 2021-08-11 ENCOUNTER — Ambulatory Visit (INDEPENDENT_AMBULATORY_CARE_PROVIDER_SITE_OTHER): Payer: Medicare Other | Admitting: *Deleted

## 2021-08-11 DIAGNOSIS — Z1231 Encounter for screening mammogram for malignant neoplasm of breast: Secondary | ICD-10-CM | POA: Diagnosis not present

## 2021-08-11 DIAGNOSIS — Z0000 Encounter for general adult medical examination without abnormal findings: Secondary | ICD-10-CM

## 2021-08-11 NOTE — Progress Notes (Signed)
Subjective:   Daisy Holloway is a 87 y.o. female who presents for Medicare Annual (Subsequent) preventive examination. Visit performed using audio Patient:home Provider:home   Review of Systems    Defer to provider Cardiac Risk Factors include: none     Objective:    There were no vitals filed for this visit. There is no height or weight on file to calculate BMI.  Advanced Directives 08/11/2021 11/20/2017 07/17/2017 04/12/2017 03/13/2017 02/20/2017 10/17/2016  Does Patient Have a Medical Advance Directive? Yes Yes Yes Yes Yes No Yes  Type of Advance Directive Living will [place];Living will [place];Living will Healthcare Power of [place];Living will - Living will;Healthcare Power of Attorney  Does patient want to make changes to medical advance directive? - No - Patient declined No - Patient declined - No - Patient declined - -  Copy of [place] in Chart? - No - copy requested No - copy requested - No - copy requested - -  Would patient like information on creating a medical advance directive? - - - - - No - Patient declined -    Current Medications (verified) Outpatient Encounter Medications as of 08/11/2021  Medication Sig   albuterol (VENTOLIN HFA) 108 (90 Base) MCG/ACT inhaler USE 2 INHALATIONS BY MOUTH  INTO THE LUNGS 4 TIMES  DAILY AS NEEDED   ALPRAZolam (XANAX) 0.25 MG tablet Take 1 tablet (0.25 mg total) by mouth 2 (two) times daily as needed for anxiety.   aspirin EC 81 MG tablet Take 81 mg by mouth daily.   atorvastatin (LIPITOR) 10 MG tablet TAKE 1 TABLET BY MOUTH  DAILY   Cholecalciferol (VITAMIN D3) 2000 UNITS capsule Take 5,000 Units by mouth daily.    diltiazem (CARDIZEM CD) 120 MG 24 hr capsule TAKE 1 CAPSULE BY MOUTH EVERY DAY   fluticasone (FLONASE) 50 MCG/ACT nasal spray Place 1 spray into both nostrils in the morning and at bedtime.   Fluticasone-Salmeterol (WIXELA INHUB)  250-50 MCG/DOSE AEPB Inhale 1 puff into the lungs 2 (two) times daily.   Glycerin-Hypromellose-PEG 400 0.2-0.2-1 % SOLN Apply to eye.   ipratropium-albuterol (DUONEB) 0.5-2.5 (3) MG/3ML SOLN Take 3 mLs by nebulization See admin instructions.   loratadine (CLARITIN) 10 MG tablet Take 10 mg by mouth daily. Reported on 06/13/2016   Multiple Vitamin (MULTI-VITAMINS) TABS Take by mouth. Reported on 06/13/2016   nystatin-triamcinolone (MYCOLOG II) cream APPLY TO AFFECTED AREA TWICE A DAY   Omega-3 Fatty Acids (FISH OIL) 1000 MG CPDR Take by mouth.   omeprazole (PRILOSEC) 20 MG capsule TAKE 1 CAPSULE BY MOUTH  TWICE DAILY   sodium chloride (OCEAN) 0.65 % nasal spray Place into the nose.   Umeclidinium-Vilanterol (ANORO ELLIPTA IN) Inhale 1 puff into the lungs daily.   vitamin B-12 (CYANOCOBALAMIN) 1000 MCG tablet Take by mouth.   No facility-administered encounter medications on file as of 08/11/2021.    Allergies (verified) Aspirin and Sulfa antibiotics   History: Past Medical History:  Diagnosis Date   Anemia    IDA   Arthritis    Asthma    GERD (gastroesophageal reflux disease)    Iron deficiency anemia due to chronic blood loss 04/01/2015   Osteoporosis    Past Surgical History:  Procedure Laterality Date   ABDOMINAL HYSTERECTOMY  1964   ovaries left in   BREAST BIOPSY Right 1998   patient states calcifications-benign   CATARACT EXTRACTION     one eye in  2007 and the other eye 2012   CHOLECYSTECTOMY     KNEE SURGERY Right 2004   KNEE SURGERY Left 2012   SHOULDER SURGERY  1995   Family History  Problem Relation Age of Onset   Liver cancer Mother    Arthritis Mother    Gastric cancer Father    Asthma Father    Diabetes Sister    Diabetes Brother    Breast cancer Maternal Aunt    Breast cancer Cousin    Social History   Socioeconomic History   Marital status: Married    Spouse name: Not on file   Number of children: Not on file   Years of education: Not on file    Highest education level: Not on file  Occupational History   Not on file  Tobacco Use   Smoking status: Never   Smokeless tobacco: Never  Vaping Use   Vaping Use: Never used  Substance and Sexual Activity   Alcohol use: Yes    Comment: occ. wine   Drug use: No   Sexual activity: Not on file  Other Topics Concern   Not on file  Social History Narrative   Not on file   Social Determinants of Health   Financial Resource Strain: Low Risk    Difficulty of Paying Living Expenses: Not very hard  Food Insecurity: No Food Insecurity   Worried About Running Out of Food in the Last Year: Never true   Ran Out of Food in the Last Year: Never true  Transportation Needs: No Transportation Needs   Lack of Transportation (Medical): No   Lack of Transportation (Non-Medical): No  Physical Activity: Insufficiently Active   Days of Exercise per Week: 2 days   Minutes of Exercise per Session: 10 min  Stress: No Stress Concern Present   Feeling of Stress : Only a little  Social Connections: Moderately Integrated   Frequency of Communication with Friends and Family: More than three times a week   Frequency of Social Gatherings with Friends and Family: More than three times a week   Attends Religious Services: 1 to 4 times per year   Active Member of [employer] or Organizations: No   Attends Music therapist: Never   Marital Status: Married    Tobacco Counseling Counseling given: Not Answered   Clinical Intake:  Pre-visit preparation completed: Yes  Pain : No/denies pain     Diabetes: No  How often do you need to have someone help you when you read instructions, pamphlets, or other written materials from your doctor or pharmacy?: 1 - Never What is the last grade level you completed in school?: 12  Diabetic?no  Interpreter Needed?: No  Information entered by :: Lacretia Nicks CMA   Activities of Daily Living In your present state of health, do you have any difficulty  performing the following activities: 08/11/2021  Hearing? N  Vision? N  Difficulty concentrating or making decisions? N  Walking or climbing stairs? N  Dressing or bathing? N  Doing errands, shopping? N  Preparing Food and eating ? N  Using the Toilet? N  In the past six months, have you accidently leaked urine? N  Do you have problems with loss of bowel control? N  Managing your Medications? N  Managing your Finances? N  Housekeeping or managing your Housekeeping? N  Some recent data might be hidden    Patient Care Team: Cletis Athens, MD as PCP - General (Internal Medicine)  Indicate any  recent Medical Services you may have received from other than [facility] providers in the past year (date may be approximate).     Assessment:   This is a routine wellness examination for Ellene.  Hearing/Vision screen No results found.  Dietary issues and exercise activities discussed: Current Exercise Habits: Home exercise routine, Type of exercise: walking, Time (Minutes): 15, Frequency (Times/Week): 2, Weekly Exercise (Minutes/Week): 30, Intensity: Mild   Goals Addressed   None    Depression Screen PHQ 2/9 Scores 08/11/2021 06/23/2021 09/16/2020  PHQ - 2 Score 1 0 0    Fall Risk Fall Risk  08/11/2021 06/23/2021 09/16/2020  Falls in the past year? 0 0 0  Number falls in past yr: 0 0 0  Injury with Fall? 0 0 0  Risk for fall due to : No Fall Risks No Fall Risks -  Follow up - Falls evaluation completed -    FALL RISK PREVENTION PERTAINING TO THE HOME:  Any stairs in or around the home? No  If so, are there any without handrails? No  Home free of loose throw rugs in walkways, pet beds, electrical cords, etc? Yes  Adequate lighting in your home to reduce risk of falls? Yes   ASSISTIVE DEVICES UTILIZED TO PREVENT FALLS:  Life alert? No  Use of a cane, walker or w/c? Yes  Grab bars in the bathroom? No  Shower chair or bench in shower? Yes  Elevated toilet seat or a handicapped toilet?  No   TIMED UP AND GO:  Was the test performed? No .     Gait slow and steady with assistive device  Cognitive Function: MMSE - Mini Mental State Exam 08/11/2021  Orientation to time 5  Orientation to Place 5  Registration 3  Attention/ Calculation 5  Recall 3  Language- name 2 objects 2  Language- repeat 1  Language- follow 3 step command 3  Language- read & follow direction 1  Write a sentence 1  Copy design 1  Total score 30     6CIT Screen 08/11/2021  What Year? 0 points  What month? 0 points  What time? 0 points  Count back from 20 0 points  Months in reverse 0 points  Repeat phrase 0 points  Total Score 0    Immunizations Immunization History  Administered Date(s) Administered   Fluad Quad(high Dose 65+) 11/05/2020   Influenza, High Dose Seasonal PF 09/05/2016, 07/30/2017   Influenza-Unspecified 10/05/2015, 09/16/2020   PFIZER(Purple Top)SARS-COV-2 Vaccination 12/10/2019, 12/31/2019, 09/08/2020   Pneumococcal Conjugate-13 09/23/2015   Pneumococcal Polysaccharide-23 10/30/2017    TDAP status: Up to date  Flu Vaccine status: Up to date  Pneumococcal vaccine status: Up to date  Covid-19 vaccine status: Completed vaccines  Qualifies for Shingles Vaccine? No   Zostavax completed Yes   Shingrix Completed?: Yes  Screening Tests Health Maintenance  Topic Date Due   TETANUS/TDAP  Never done   Zoster Vaccines- Shingrix (1 of 2) Never done   DEXA SCAN  Never done   COVID-19 Vaccine (4 - Booster for Pfizer series) 12/01/2020   PNA vac Low Risk Adult  Completed   HPV VACCINES  Aged Out    Health Maintenance  Health Maintenance Due  Topic Date Due   TETANUS/TDAP  Never done   Zoster Vaccines- Shingrix (1 of 2) Never done   DEXA SCAN  Never done   COVID-19 Vaccine (4 - Booster for Pfizer series) 12/01/2020    Colorectal cancer screening: No longer required.  Mammogram status: Ordered today. Pt provided with contact info and advised to call to  schedule appt.    Lung Cancer Screening: (Low Dose CT Chest recommended if Age 4-80 years, 30 pack-year currently smoking OR have quit w/in 15years.) does not qualify.   Lung Cancer Screening Referral: na  Additional Screening:  Hepatitis C Screening: does not qualify;   Vision Screening: Recommended annual ophthalmology exams for early detection of glaucoma and other disorders of the eye. Is the patient up to date with their annual eye exam?  Yes  Who is the provider or what is the name of the office in which the patient attends annual eye exams? [facility] If pt is not established with a provider, would they like to be referred to a provider to establish care?  Patient established .   Dental Screening: Recommended annual dental exams for proper oral hygiene  Community Resource Referral / Chronic Care Management: CRR required this visit?  No   CCM required this visit?  No      Plan:     I have personally reviewed and noted the following in the patient's chart:   Medical and social history Use of alcohol, tobacco or illicit drugs  Current medications and supplements including opioid prescriptions.  Functional ability and status Nutritional status Physical activity Advanced directives List of other physicians Hospitalizations, surgeries, and ER visits in previous 12 months Vitals Screenings to include cognitive, depression, and falls Referrals and appointments  In addition, I have reviewed and discussed with patient certain preventive protocols, quality metrics, and best practice recommendations. A written personalized care plan for preventive services as well as general preventive health recommendations were provided to patient.     Lacretia Nicks, [place]   08/11/2021   Nurse Notes:  Ms. Deasy , Thank you for taking time to come for your Medicare Wellness Visit. I appreciate your ongoing commitment to your health goals. Please review the following plan we discussed  and let me know if I can assist you in the future.   These are the goals we discussed:  Goals   None     This is a list of the screening recommended for you and due dates:  Health Maintenance  Topic Date Due   Tetanus Vaccine  Never done   Zoster (Shingles) Vaccine (1 of 2) Never done   DEXA scan (bone density measurement)  Never done   COVID-19 Vaccine (4 - Booster for Pfizer series) 12/01/2020   Pneumonia vaccines  Completed   HPV Vaccine  Aged Out     Time spent with patient 40 min

## 2021-08-12 NOTE — Progress Notes (Signed)
I have reviewed this visit and agree with the documentation.   

## 2021-08-30 DIAGNOSIS — D0439 Carcinoma in situ of skin of other parts of face: Secondary | ICD-10-CM | POA: Diagnosis not present

## 2021-08-30 DIAGNOSIS — L570 Actinic keratosis: Secondary | ICD-10-CM | POA: Diagnosis not present

## 2021-08-30 DIAGNOSIS — D485 Neoplasm of uncertain behavior of skin: Secondary | ICD-10-CM | POA: Diagnosis not present

## 2021-09-23 ENCOUNTER — Other Ambulatory Visit: Payer: Self-pay | Admitting: Internal Medicine

## 2021-09-26 DIAGNOSIS — D0439 Carcinoma in situ of skin of other parts of face: Secondary | ICD-10-CM | POA: Diagnosis not present

## 2021-09-26 DIAGNOSIS — C44321 Squamous cell carcinoma of skin of nose: Secondary | ICD-10-CM | POA: Diagnosis not present

## 2021-10-03 ENCOUNTER — Encounter: Payer: Medicare Other | Admitting: Internal Medicine

## 2021-10-05 ENCOUNTER — Ambulatory Visit: Payer: Medicare Other | Admitting: Internal Medicine

## 2021-10-06 ENCOUNTER — Encounter: Payer: Medicare Other | Admitting: *Deleted

## 2021-10-06 NOTE — Progress Notes (Deleted)
Subjective:   Daisy Holloway is a 87 y.o. female who presents for Medicare Annual (Subsequent) preventive examination.  Review of Systems    ***       Objective:    There were no vitals filed for this visit. There is no height or weight on file to calculate BMI.  Advanced Directives 08/11/2021 11/20/2017 07/17/2017 04/12/2017 03/13/2017 02/20/2017 10/17/2016  Does Patient Have a Medical Advance Directive? Yes Yes Yes Yes Yes No Yes  Type of Advance Directive Living will [place];Living will [place];Living will Healthcare Power of [place];Living will - Living will;Healthcare Power of Attorney  Does patient want to make changes to medical advance directive? - No - Patient declined No - Patient declined - No - Patient declined - -  Copy of Healthcare Power of Attorney in Chart? - No - copy requested No - copy requested - No - copy requested - -  Would patient like information on creating a medical advance directive? - - - - - No - Patient declined -    Current Medications (verified) Outpatient Encounter Medications as of 10/06/2021  Medication Sig  . albuterol (VENTOLIN HFA) 108 (90 Base) MCG/ACT inhaler USE 2 INHALATIONS BY MOUTH  INTO THE LUNGS 4 TIMES  DAILY AS NEEDED  . ALPRAZolam (XANAX) 0.25 MG tablet Take 1 tablet (0.25 mg total) by mouth 2 (two) times daily as needed for anxiety.  Marland Kitchen aspirin EC 81 MG tablet Take 81 mg by mouth daily.  Marland Kitchen atorvastatin (LIPITOR) 10 MG tablet TAKE 1 TABLET BY MOUTH  DAILY  . CARTIA XT 120 MG 24 hr capsule TAKE 1 CAPSULE BY MOUTH  DAILY  . Cholecalciferol (VITAMIN D3) 2000 UNITS capsule Take 5,000 Units by mouth daily.   . fluticasone (FLONASE) 50 MCG/ACT nasal spray Place 1 spray into both nostrils in the morning and at bedtime.  . Fluticasone-Salmeterol (WIXELA INHUB) 250-50 MCG/DOSE AEPB Inhale 1 puff into the lungs 2 (two) times daily.  . Glycerin-Hypromellose-PEG 400 0.2-0.2-1 %  SOLN Apply to eye.  Marland Kitchen ipratropium-albuterol (DUONEB) 0.5-2.5 (3) MG/3ML SOLN Take 3 mLs by nebulization See admin instructions.  Marland Kitchen loratadine (CLARITIN) 10 MG tablet Take 10 mg by mouth daily. Reported on 06/13/2016  . Multiple Vitamin (MULTI-VITAMINS) TABS Take by mouth. Reported on 06/13/2016  . nystatin-triamcinolone (MYCOLOG II) cream APPLY TO AFFECTED AREA TWICE A DAY  . Omega-3 Fatty Acids (FISH OIL) 1000 MG CPDR Take by mouth.  Marland Kitchen omeprazole (PRILOSEC) 20 MG capsule TAKE 1 CAPSULE BY MOUTH  TWICE DAILY  . sodium chloride (OCEAN) 0.65 % nasal spray Place into the nose.  Marland Kitchen Umeclidinium-Vilanterol (ANORO ELLIPTA IN) Inhale 1 puff into the lungs daily.  . vitamin B-12 (CYANOCOBALAMIN) 1000 MCG tablet Take by mouth.   No facility-administered encounter medications on file as of 10/06/2021.    Allergies (verified) Aspirin and Sulfa antibiotics   History: Past Medical History:  Diagnosis Date  . Anemia    IDA  . Arthritis   . Asthma   . GERD (gastroesophageal reflux disease)   . Iron deficiency anemia due to chronic blood loss 04/01/2015  . Osteoporosis    Past Surgical History:  Procedure Laterality Date  . ABDOMINAL HYSTERECTOMY  1964   ovaries left in  . BREAST BIOPSY Right 1998   patient states calcifications-benign  . CATARACT EXTRACTION     one eye in 2007 and the other eye 2012  . CHOLECYSTECTOMY    .  KNEE SURGERY Right 2004  . KNEE SURGERY Left 2012  . SHOULDER SURGERY  1995   Family History  Problem Relation Age of Onset  . Liver cancer Mother   . Arthritis Mother   . Gastric cancer Father   . Asthma Father   . Diabetes Sister   . Diabetes Brother   . Breast cancer Maternal Aunt   . Breast cancer Cousin    Social History   Socioeconomic History  . Marital status: Married    Spouse name: Not on file  . Number of children: Not on file  . Years of education: Not on file  . Highest education level: Not on file  Occupational History  . Not on file  Tobacco  Use  . Smoking status: Never  . Smokeless tobacco: Never  Vaping Use  . Vaping Use: Never used  Substance and Sexual Activity  . Alcohol use: Yes    Comment: occ. wine  . Drug use: No  . Sexual activity: Not on file  Other Topics Concern  . Not on file  Social History Narrative  . Not on file   Social Determinants of Health   Financial Resource Strain: Low Risk   . Difficulty of Paying Living Expenses: Not very hard  Food Insecurity: No Food Insecurity  . Worried About Charity fundraiser in the Last Year: Never true  . Ran Out of Food in the Last Year: Never true  Transportation Needs: No Transportation Needs  . Lack of Transportation (Medical): No  . Lack of Transportation (Non-Medical): No  Physical Activity: Insufficiently Active  . Days of Exercise per Week: 2 days  . Minutes of Exercise per Session: 10 min  Stress: No Stress Concern Present  . Feeling of Stress : Only a little  Social Connections: Moderately Integrated  . Frequency of Communication with Friends and Family: More than three times a week  . Frequency of Social Gatherings with Friends and Family: More than three times a week  . Attends Religious Services: 1 to 4 times per year  . Active Member of Clubs or Organizations: No  . Attends Archivist Meetings: Never  . Marital Status: Married    Tobacco Counseling Counseling given: Not Answered   Clinical Intake:                 Diabetic?***         Activities of Daily Living In your present state of health, do you have any difficulty performing the following activities: 08/11/2021  Hearing? N  Vision? N  Difficulty concentrating or making decisions? N  Walking or climbing stairs? N  Dressing or bathing? N  Doing errands, shopping? N  Preparing Food and eating ? N  Using the Toilet? N  In the past six months, have you accidently leaked urine? N  Do you have problems with loss of bowel control? N  Managing your Medications?  N  Managing your Finances? N  Housekeeping or managing your Housekeeping? N  Some recent data might be hidden    Patient Care Team: Cletis Athens, MD as PCP - General (Internal Medicine)  Indicate any recent Medical Services you may have received from other than [facility] providers in the past year (date may be approximate).     Assessment:   This is a routine wellness examination for Daisy Holloway.  Hearing/Vision screen No results found.  Dietary issues and exercise activities discussed:     Goals Addressed   None  Depression Screen PHQ 2/9 Scores 08/11/2021 06/23/2021 09/16/2020  PHQ - 2 Score 1 0 0    Fall Risk Fall Risk  08/11/2021 06/23/2021 09/16/2020  Falls in the past year? 0 0 0  Number falls in past yr: 0 0 0  Injury with Fall? 0 0 0  Risk for fall due to : No Fall Risks No Fall Risks -  Follow up - Falls evaluation completed -    FALL RISK PREVENTION PERTAINING TO THE HOME:  Any stairs in or around the home? {YES/NO:21197} If so, are there any without handrails? {YES/NO:21197} Home free of loose throw rugs in walkways, pet beds, electrical cords, etc? {YES/NO:21197} Adequate lighting in your home to reduce risk of falls? {YES/NO:21197}  ASSISTIVE DEVICES UTILIZED TO PREVENT FALLS:  Life alert? {YES/NO:21197} Use of a cane, walker or w/c? {YES/NO:21197} Grab bars in the bathroom? {YES/NO:21197} Shower chair or bench in shower? {YES/NO:21197} Elevated toilet seat or a handicapped toilet? {YES/NO:21197}  TIMED UP AND GO:  Was the test performed? {YES/NO:21197}.  Length of time to ambulate 10 feet: *** sec.   {Appearance of Gait:2101803}  Cognitive Function: MMSE - Mini Mental State Exam 08/11/2021  Orientation to time 5  Orientation to Place 5  Registration 3  Attention/ Calculation 5  Recall 3  Language- name 2 objects 2  Language- repeat 1  Language- follow 3 step command 3  Language- read & follow direction 1  Write a sentence 1  Copy design 1  Total  score 30     6CIT Screen 08/11/2021  What Year? 0 points  What month? 0 points  What time? 0 points  Count back from 20 0 points  Months in reverse 0 points  Repeat phrase 0 points  Total Score 0    Immunizations Immunization History  Administered Date(s) Administered  . Fluad Quad(high Dose 65+) 11/05/2020  . Influenza, High Dose Seasonal PF 09/05/2016, 07/30/2017  . Influenza-Unspecified 10/05/2015, 09/16/2020  . PFIZER(Purple Top)SARS-COV-2 Vaccination 12/10/2019, 12/31/2019, 09/08/2020  . Pneumococcal Conjugate-13 09/23/2015  . Pneumococcal Polysaccharide-23 10/30/2017    {TDAP status:2101805}  {Flu Vaccine status:2101806}  {Pneumococcal vaccine status:2101807}  {Covid-19 vaccine status:2101808}  Qualifies for Shingles Vaccine? {YES/NO:21197}  Zostavax completed {YES/NO:21197}  {Shingrix Completed?:2101804}  Screening Tests Health Maintenance  Topic Date Due  . TETANUS/TDAP  Never done  . Zoster Vaccines- Shingrix (1 of 2) Never done  . DEXA SCAN  Never done  . COVID-19 Vaccine (4 - Booster for [place] series) 11/03/2020  . Pneumonia Vaccine 52+ Years old  Completed  . HPV VACCINES  Aged Out    Health Maintenance  Health Maintenance Due  Topic Date Due  . TETANUS/TDAP  Never done  . Zoster Vaccines- Shingrix (1 of 2) Never done  . DEXA SCAN  Never done  . COVID-19 Vaccine (4 - Booster for Pfizer series) 11/03/2020    {Colorectal cancer screening:2101809}  {Mammogram status:21018020}  {Bone Density status:21018021}  Lung Cancer Screening: (Low Dose CT Chest recommended if Age 48-80 years, 30 pack-year currently smoking OR have quit w/in 15years.) {DOES NOT does:27190::"does not"} qualify.   Lung Cancer Screening Referral: ***  Additional Screening:  Hepatitis C Screening: {DOES NOT does:27190::"does not"} qualify; Completed ***  Vision Screening: Recommended annual ophthalmology exams for early detection of glaucoma and other disorders of the  eye. Is the patient up to date with their annual eye exam?  {YES/NO:21197} Who is the provider or what is the name of the office in which the patient attends annual  eye exams? *** If pt is not established with a provider, would they like to be referred to a provider to establish care? {YES/NO:21197}.   Dental Screening: Recommended annual dental exams for proper oral hygiene  Community Resource Referral / Chronic Care Management: CRR required this visit?  {YES/NO:21197}  CCM required this visit?  {YES/NO:21197}     Plan:     I have personally reviewed and noted the following in the patient's chart:   Medical and social history Use of alcohol, tobacco or illicit drugs  Current medications and supplements including opioid prescriptions.  Functional ability and status Nutritional status Physical activity Advanced directives List of other physicians Hospitalizations, surgeries, and ER visits in previous 12 months Vitals Screenings to include cognitive, depression, and falls Referrals and appointments  In addition, I have reviewed and discussed with patient certain preventive protocols, quality metrics, and best practice recommendations. A written personalized care plan for preventive services as well as general preventive health recommendations were provided to patient.     Lacretia Nicks, [place]   10/06/2021   Nurse Notes: ***

## 2021-10-09 IMAGING — CR DG CHEST 2V
1 series · 2 of 2 positions shown · non-contrast
Comparison: 04/12/2017

CLINICAL DATA: Coughing up blood.

EXAM:
CHEST - 2 VIEW

[Series 1: dg chest 2 view · 0.14mm/px · 2 of 2 slices shown]
[im 1/2]
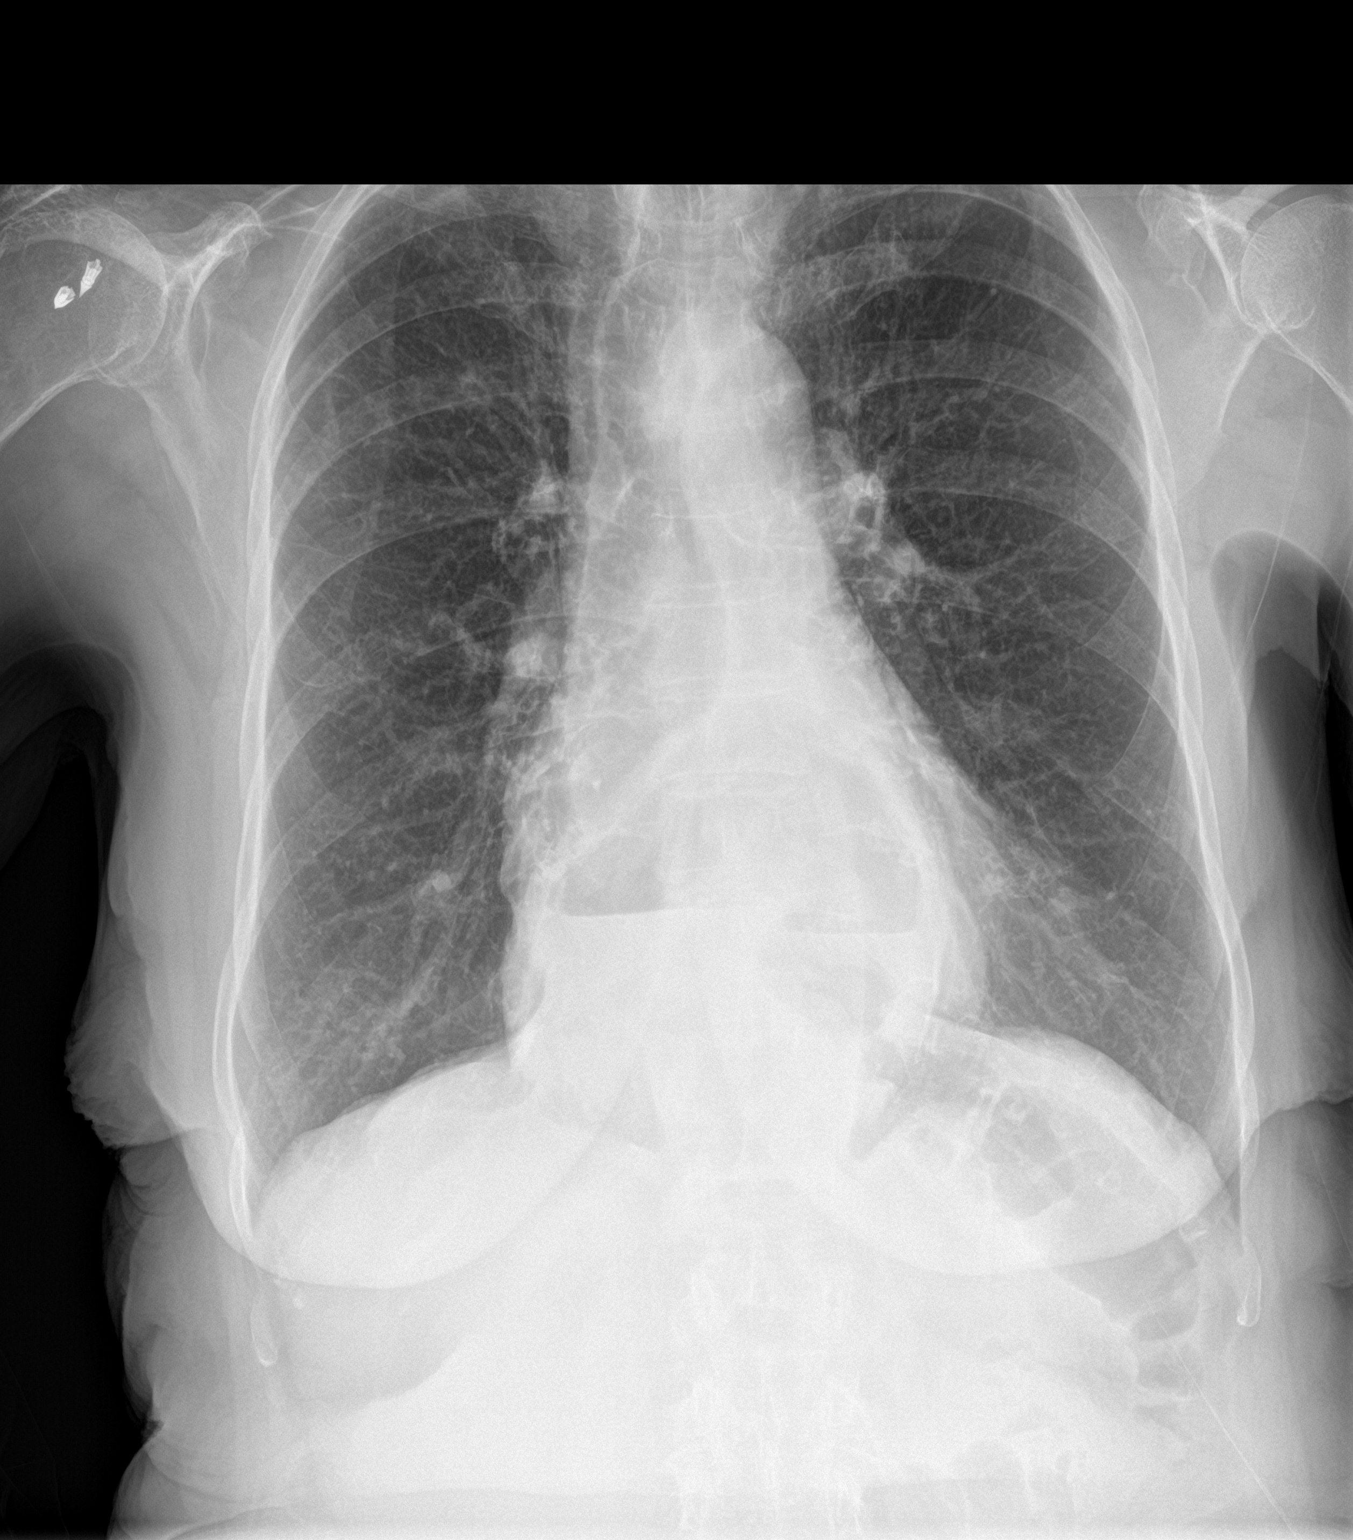
[im 2/2]
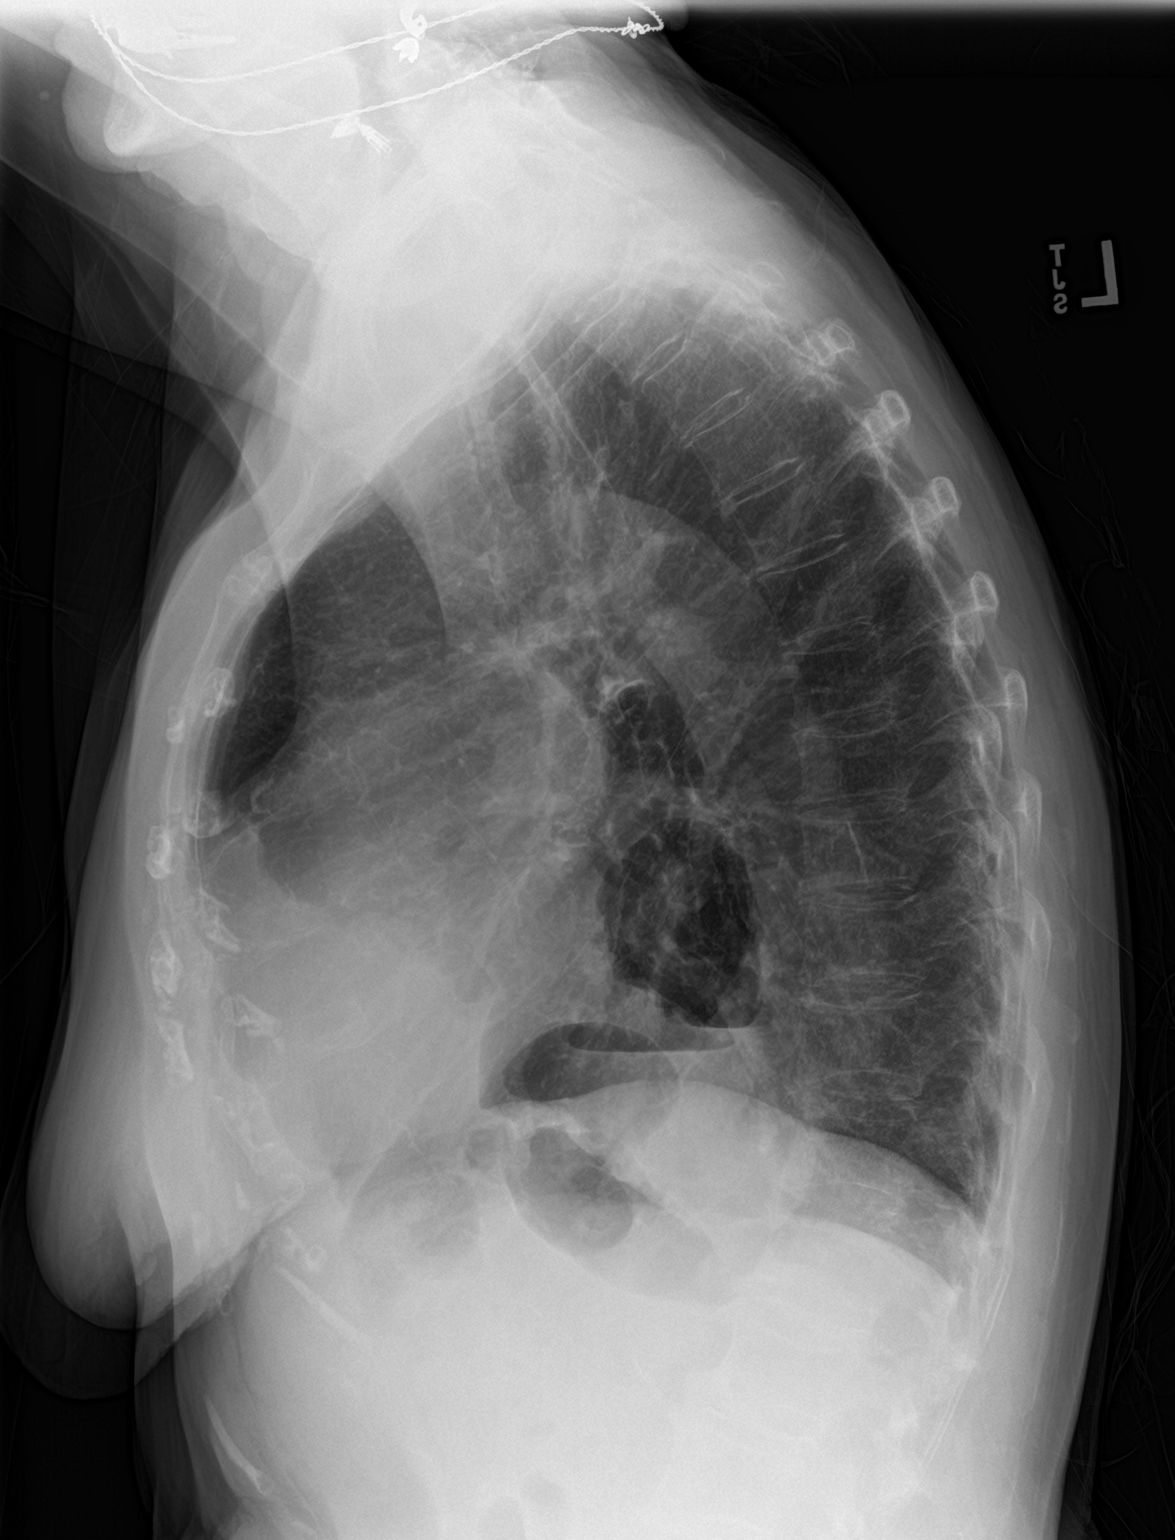

[2 of 2 positions shown; findings below may reference images not displayed]

FINDINGS: Lungs are hyperexpanded. Interstitial markings are diffusely
coarsened with chronic features. The lungs are clear without focal
pneumonia, edema, pneumothorax or pleural effusion. The
cardiopericardial silhouette is within normal limits for size. Large
hiatal hernia again noted. Bones are diffusely demineralized.
IMPRESSION: 1. Emphysema without acute cardiopulmonary findings.
2. Large hiatal hernia.

## 2021-10-12 DIAGNOSIS — Z961 Presence of intraocular lens: Secondary | ICD-10-CM | POA: Diagnosis not present

## 2021-12-19 ENCOUNTER — Other Ambulatory Visit: Payer: Self-pay | Admitting: *Deleted

## 2021-12-19 MED ORDER — OMEPRAZOLE 20 MG PO CPDR: 20.0000 mg | DELAYED_RELEASE_CAPSULE | Freq: Two times a day (BID) | ORAL | 3 refills | Status: DC

## 2021-12-26 ENCOUNTER — Other Ambulatory Visit: Payer: Self-pay | Admitting: *Deleted

## 2021-12-26 MED ORDER — FLUTICASONE-SALMETEROL 250-50 MCG/ACT IN AEPB: 1.0000 | INHALATION_SPRAY | Freq: Two times a day (BID) | RESPIRATORY_TRACT | 3 refills | Status: DC

## 2022-03-10 ENCOUNTER — Other Ambulatory Visit: Payer: Self-pay | Admitting: Internal Medicine

## 2022-03-29 ENCOUNTER — Other Ambulatory Visit: Payer: Self-pay | Admitting: Internal Medicine

## 2022-03-29 DIAGNOSIS — D509 Iron deficiency anemia, unspecified: Secondary | ICD-10-CM

## 2022-05-19 ENCOUNTER — Other Ambulatory Visit: Payer: Self-pay | Admitting: *Deleted

## 2022-06-05 ENCOUNTER — Other Ambulatory Visit: Payer: Self-pay | Admitting: Internal Medicine

## 2022-06-19 ENCOUNTER — Ambulatory Visit (INDEPENDENT_AMBULATORY_CARE_PROVIDER_SITE_OTHER): Payer: Medicare Other

## 2022-06-19 ENCOUNTER — Ambulatory Visit
Admission: EM | Admit: 2022-06-19 | Discharge: 2022-06-19 | Disposition: A | Discharge status: Home / Self Care | Payer: Medicare Other | Attending: Internal Medicine | Admitting: Internal Medicine

## 2022-06-19 ENCOUNTER — Other Ambulatory Visit: Payer: Self-pay

## 2022-06-19 DIAGNOSIS — J441 Chronic obstructive pulmonary disease with (acute) exacerbation: Secondary | ICD-10-CM | POA: Insufficient documentation

## 2022-06-19 DIAGNOSIS — J4521 Mild intermittent asthma with (acute) exacerbation: Secondary | ICD-10-CM | POA: Diagnosis not present

## 2022-06-19 DIAGNOSIS — E7800 Pure hypercholesterolemia, unspecified: Secondary | ICD-10-CM | POA: Diagnosis not present

## 2022-06-19 DIAGNOSIS — J90 Pleural effusion, not elsewhere classified: Secondary | ICD-10-CM | POA: Diagnosis not present

## 2022-06-19 DIAGNOSIS — Z20822 Contact with and (suspected) exposure to covid-19: Secondary | ICD-10-CM | POA: Diagnosis not present

## 2022-06-19 DIAGNOSIS — R9431 Abnormal electrocardiogram [ECG] [EKG]: Secondary | ICD-10-CM | POA: Diagnosis not present

## 2022-06-19 DIAGNOSIS — R059 Cough, unspecified: Secondary | ICD-10-CM | POA: Diagnosis not present

## 2022-06-19 DIAGNOSIS — R7989 Other specified abnormal findings of blood chemistry: Secondary | ICD-10-CM | POA: Diagnosis not present

## 2022-06-19 DIAGNOSIS — R5383 Other fatigue: Secondary | ICD-10-CM | POA: Diagnosis not present

## 2022-06-19 DIAGNOSIS — R0602 Shortness of breath: Secondary | ICD-10-CM | POA: Diagnosis not present

## 2022-06-19 DIAGNOSIS — J440 Chronic obstructive pulmonary disease with acute lower respiratory infection: Secondary | ICD-10-CM | POA: Diagnosis not present

## 2022-06-19 DIAGNOSIS — I350 Nonrheumatic aortic (valve) stenosis: Secondary | ICD-10-CM | POA: Diagnosis not present

## 2022-06-19 DIAGNOSIS — R5381 Other malaise: Secondary | ICD-10-CM | POA: Diagnosis not present

## 2022-06-19 DIAGNOSIS — R42 Dizziness and giddiness: Secondary | ICD-10-CM | POA: Diagnosis not present

## 2022-06-19 DIAGNOSIS — M47816 Spondylosis without myelopathy or radiculopathy, lumbar region: Secondary | ICD-10-CM | POA: Diagnosis not present

## 2022-06-19 DIAGNOSIS — K449 Diaphragmatic hernia without obstruction or gangrene: Secondary | ICD-10-CM | POA: Diagnosis not present

## 2022-06-19 DIAGNOSIS — Z882 Allergy status to sulfonamides status: Secondary | ICD-10-CM | POA: Diagnosis not present

## 2022-06-19 LAB — CBC
HCT: 40.7 % (ref 36.0–46.0)
Hemoglobin: 12.8 g/dL (ref 12.0–15.0)
MCH: 26.9 pg (ref 26.0–34.0)
MCHC: 31.4 g/dL (ref 30.0–36.0)
MCV: 85.5 fL (ref 80.0–100.0)
Platelets: 143 10*3/uL — ABNORMAL LOW (ref 150–400)
RBC: 4.76 MIL/uL (ref 3.87–5.11)
RDW: 15.2 % (ref 11.5–15.5)
WBC: 6.6 10*3/uL (ref 4.0–10.5)
nRBC: 0 % (ref 0.0–0.2)

## 2022-06-19 LAB — BRAIN NATRIURETIC PEPTIDE: B Natriuretic Peptide: 143.1 pg/mL — ABNORMAL HIGH (ref 0.0–100.0)

## 2022-06-19 LAB — SARS CORONAVIRUS 2 BY RT PCR: SARS Coronavirus 2 by RT PCR: NEGATIVE

## 2022-06-19 MED ORDER — IPRATROPIUM-ALBUTEROL 0.5-2.5 (3) MG/3ML IN SOLN
3.0000 mL | Freq: Once | RESPIRATORY_TRACT | Status: AC
  Administered 2022-06-19: 3 mL via RESPIRATORY_TRACT

## 2022-06-19 NOTE — Discharge Instructions (Signed)
I will call you when your test results are back

## 2022-06-19 NOTE — ED Provider Notes (Signed)
[facility]    CSN: [id number] Arrival date & time: 06/19/22  1147      History   Chief Complaint Chief Complaint  Patient presents with   Shortness of Breath   Fatigue   nasal drainage    HPI Daisy Holloway is a 88 y.o. female who presents with her daughter, and pt complains of developing cough, post nasal drainage, fatigue, low appetite and SOB x 2 days. She felt more SOB last night and used her Albuterol inhaler multiple times. Has hx of COPD per pulmonologist. Has not seen her PCP in over a year, and needs a new PCP since he is retiring. Her cough is non productive. Using her inhaler has helped her SOB. Had a negative home Covid test.She admits she has to sleep with her bed slightly reclined and 2 pillows, due to SOB. She does not recall what her past echo test have shown. Denies edema. Has been fatigued.     Past Medical History:  Diagnosis Date   Anemia    IDA   Arthritis    Asthma    GERD (gastroesophageal reflux disease)    Iron deficiency anemia due to chronic blood loss 04/01/2015   Osteoporosis     Patient Active Problem List   Diagnosis Date Noted   Post-COVID syndrome resolved 06/23/2021   GERD (gastroesophageal reflux disease)    Annual physical exam 09/16/2020   Mitral valve disease 09/16/2020   Chronic bronchitis ([place]) 07/19/2020   Anxiety as acute reaction to exceptional stress 04/16/2020   Chronic fatigue 04/16/2020   Iron deficiency anemia due to chronic blood loss 04/01/2015    Past Surgical History:  Procedure Laterality Date   ABDOMINAL HYSTERECTOMY  1964   ovaries left in   BREAST BIOPSY Right 1998   patient states calcifications-benign   CATARACT EXTRACTION     one eye in 2007 and the other eye 2012   CHOLECYSTECTOMY     KNEE SURGERY Right 2004   KNEE SURGERY Left 2012   [place]    OB History   No obstetric history on file.      Home Medications    Prior to Admission medications   Medication Sig  Start Date End Date Taking? Authorizing Provider  albuterol (VENTOLIN HFA) 108 (90 Base) MCG/ACT inhaler USE 2 INHALATIONS BY MOUTH  INTO THE LUNGS 4 TIMES  DAILY AS NEEDED 03/13/22   Cletis Athens, MD  aspirin EC 81 MG tablet Take 81 mg by mouth daily.    [provider]  atorvastatin (LIPITOR) 10 MG tablet TAKE 1 TABLET BY MOUTH  DAILY 03/30/22   Cletis Athens, MD  Cholecalciferol (VITAMIN D3) 2000 UNITS capsule Take 5,000 Units by mouth daily.     [provider]  diltiazem (CARDIZEM CD) 120 MG 24 hr capsule TAKE 1 CAPSULE BY MOUTH  DAILY 06/07/22   Cletis Athens, MD  fluticasone (FLONASE) 50 MCG/ACT nasal spray Place 1 spray into both nostrils in the morning and at bedtime. 03/25/21   Cletis Athens, MD  fluticasone-salmeterol [facility] INHUB) 250-50 MCG/ACT AEPB Inhale 1 puff into the lungs in the morning and at bedtime. 12/26/21   Cletis Athens, MD  Fluticasone-Salmeterol [facility] INHUB) 250-50 MCG/DOSE AEPB Inhale 1 puff into the lungs 2 (two) times daily. 12/17/20   Beckie Salts, FNP  Glycerin-Hypromellose-PEG 400 0.2-0.2-1 % SOLN Apply to eye.    [provider]  ipratropium-albuterol (DUONEB) 0.5-2.5 (3) MG/3ML SOLN Take 3 mLs by nebulization See  admin instructions. 02/04/21   Cletis Athens, MD  loratadine (CLARITIN) 10 MG tablet Take 10 mg by mouth daily. Reported on 06/13/2016    [provider]  Multiple Vitamin (MULTI-VITAMINS) TABS Take by mouth. Reported on 06/13/2016    [provider]  nystatin-triamcinolone (MYCOLOG II) cream APPLY TO AFFECTED AREA TWICE A DAY 12/30/16   [provider]  Omega-3 Fatty Acids (FISH OIL) 1000 MG CPDR Take by mouth.    [provider]  omeprazole (PRILOSEC) 20 MG capsule Take 1 capsule (20 mg total) by mouth 2 (two) times daily. 12/19/21   Cletis Athens, MD  sodium chloride (OCEAN) 0.65 % nasal spray Place into the nose.    [provider]  Umeclidinium-Vilanterol (ANORO ELLIPTA IN) Inhale 1  puff into the lungs daily.    [provider]  vitamin B-12 (CYANOCOBALAMIN) 1000 MCG tablet Take by mouth.    [provider]    Family History Family History  Problem Relation Age of Onset   Liver cancer Mother    Arthritis Mother    Gastric cancer Father    Asthma Father    Diabetes Sister    Diabetes Brother    Breast cancer Maternal Aunt    Breast cancer Cousin     Social History Social History   Tobacco Use   Smoking status: Never   Smokeless tobacco: Never  Vaping Use   Vaping Use: Never used  Substance Use Topics   Alcohol use: Yes    Comment: occ. wine   Drug use: No     Allergies   Sulfa antibiotics   Review of Systems Review of Systems  Constitutional:  Positive for appetite change, chills and fatigue. Negative for diaphoresis and fever.  HENT:  Positive for congestion and postnasal drip. Negative for ear discharge, ear pain, rhinorrhea and sore throat.   Eyes:  Negative for discharge.  Respiratory:  Positive for cough, chest tightness and shortness of breath.   Cardiovascular:  Positive for chest pain. Negative for palpitations.       Gets intermittent chest pains on her L chest if she lays on her L, and resolves when she lays on her R.   Neurological:  Negative for dizziness, weakness and headaches.     Physical Exam Triage Vital Signs ED Triage Vitals  Enc Vitals Group     BP 06/19/22 1203 (!) 132/49     Pulse Rate 06/19/22 1203 73     Resp 06/19/22 1203 19     Temp 06/19/22 1203 98.5 F (36.9 C)     Temp Source 06/19/22 1203 Oral     SpO2 06/19/22 1203 93 %     Weight 06/19/22 1158 123 lb (55.8 kg)     Height 06/19/22 1158 '5\' 3"'$  (1.6 m)     Head Circumference --      Peak Flow --      Pain Score 06/19/22 1158 0     Pain Loc --      Pain Edu? --      Excl. in [place]? --    No data found.  Updated Vital Signs BP (!) 132/49 (BP Location: Right Arm)   Pulse 73   Temp 98.5 F (36.9 C) (Oral)   Resp 19   Ht '5\' 3"'$  (1.6  m)   Wt 123 lb (55.8 kg)   SpO2 93%   BMI 21.79 kg/m   Visual Acuity Right Eye Distance:   Left Eye Distance:   Bilateral  Distance:    Right Eye Near:   Left Eye Near:    Bilateral Near:      Physical Exam Vitals signs and nursing note reviewed.  Constitutional:      General: She is not in acute distress.    Appearance: Normal appearance. She is not ill-appearing, toxic-appearing or diaphoretic.  HENT:     Head: Normocephalic.     Right Ear: Tympanic membrane, ear canal and external ear normal.     Left Ear: Tympanic membrane, ear canal and external ear normal.     Nose: Nose normal.     Mouth/Throat:     Mouth: Mucous membranes are moist.  Eyes:     General: No scleral icterus.       Right eye: No discharge.        Left eye: No discharge.     Conjunctiva/sclera: Conjunctivae normal.  Neck:     Musculoskeletal: Neck supple. No neck rigidity.  Cardiovascular:     Rate and Rhythm: Normal rate and regular rhythm.     Pulmonary:     Effort: Pulmonary effort is normal.     Breath sounds: has decreased breath sound all over, with no wheezing of crackles   Musculoskeletal: Normal range of motion. No edema present.  Lymphadenopathy:     Cervical: No cervical adenopathy.  Skin:    General: Skin is warm and dry.     Coloration: Skin is not jaundiced.     Findings: No rash.  Neurological:     Mental Status: She is alert and oriented to person, place, and time.     Gait: Gait normal.  Psychiatric:        Mood and Affect: Mood normal.        Behavior: Behavior normal.        Thought Content: Thought content normal.        Judgment: Judgment normal.    UC Treatments / Results  Labs (all labs ordered are listed, but only abnormal results are displayed) Labs Reviewed  CBC - Abnormal; Notable for the following components:      Result Value   Platelets 143 (*)    All other components within normal limits  BRAIN NATRIURETIC PEPTIDE - Abnormal; Notable for the following  components:   B Natriuretic Peptide 143.1 (*)    All other components within normal limits  SARS CORONAVIRUS 2 BY RT PCR    EKG   Radiology DG Chest 2 View  Result Date: 06/19/2022 CLINICAL DATA:  Shortness of breath EXAM: CHEST - 2 VIEW COMPARISON:  03/24/2021 FINDINGS: Moderate-sized retrocardiac hiatal hernia. Mild blunting of the right costophrenic angle, especially laterally, suspicious for a small right pleural effusion. Heart size is within normal limits. Descending thoracic aortic atherosclerosis. Thoracolumbar spondylosis. IMPRESSION: 1. Moderate-sized hiatal hernia. 2. Blunted right lateral costophrenic angle suspicious for small right pleural effusion. 3.  Aortic Atherosclerosis (ICD10-I70.0). Electronically Signed   By: Van Clines M.D.   On: 06/19/2022 12:39    Procedures Procedures (including critical care time)  Medications Ordered in UC Medications  ipratropium-albuterol (DUONEB) 0.5-2.5 (3) MG/3ML nebulizer solution 3 mL (3 mLs Nebulization Given 06/19/22 1227)    Initial Impression / Assessment and Plan / UC Course  I have reviewed the triage vital signs and the nursing notes.  Pertinent labs & imaging results that were available during my care of the patient were reviewed by me and considered in my medical decision making (see chart for details).  She was  given a Duo Neb treatment and her pulse ox went up to 96% and she felt less SOB.  BNP is elevated and WBC is normal. Has R pleural effusion and could be due to CHF. I called her when all results were back and spoke with pt and she had her granddaughter Edwina Barth and ask questions as well. Was advised to go to ER.     Final Clinical Impressions(s) / UC Diagnoses   Final diagnoses:  Pleural effusion on right  COPD exacerbation (HCC)  Mild intermittent asthma with acute exacerbation  Elevated brain natriuretic peptide (BNP) level     Discharge Instructions      I will call you when your test results  are back      ED Prescriptions   None    PDMP not reviewed this encounter.   Shelby Mattocks, [place] 06/19/22 1746

## 2022-06-19 NOTE — ED Triage Notes (Signed)
Pt states since Friday she has been feeling fatigued, congestion, shortness of breath, and drainage down back of throat. Has been missing doses of her iron supplement.

## 2022-06-20 DIAGNOSIS — Z79899 Other long term (current) drug therapy: Secondary | ICD-10-CM | POA: Diagnosis not present

## 2022-06-20 DIAGNOSIS — I349 Nonrheumatic mitral valve disorder, unspecified: Secondary | ICD-10-CM | POA: Diagnosis not present

## 2022-06-20 DIAGNOSIS — Z792 Long term (current) use of antibiotics: Secondary | ICD-10-CM | POA: Diagnosis not present

## 2022-06-20 DIAGNOSIS — E7800 Pure hypercholesterolemia, unspecified: Secondary | ICD-10-CM | POA: Diagnosis not present

## 2022-06-20 DIAGNOSIS — R918 Other nonspecific abnormal finding of lung field: Secondary | ICD-10-CM | POA: Diagnosis not present

## 2022-06-20 DIAGNOSIS — J984 Other disorders of lung: Secondary | ICD-10-CM | POA: Diagnosis not present

## 2022-06-20 DIAGNOSIS — Z9981 Dependence on supplemental oxygen: Secondary | ICD-10-CM | POA: Diagnosis not present

## 2022-06-20 DIAGNOSIS — I352 Nonrheumatic aortic (valve) stenosis with insufficiency: Secondary | ICD-10-CM | POA: Diagnosis not present

## 2022-06-20 DIAGNOSIS — R0902 Hypoxemia: Secondary | ICD-10-CM | POA: Diagnosis not present

## 2022-06-20 DIAGNOSIS — R5381 Other malaise: Secondary | ICD-10-CM | POA: Diagnosis not present

## 2022-09-12 ENCOUNTER — Other Ambulatory Visit: Payer: Self-pay | Admitting: Internal Medicine

## 2022-09-20 DIAGNOSIS — R0600 Dyspnea, unspecified: Secondary | ICD-10-CM | POA: Diagnosis not present

## 2022-09-28 ENCOUNTER — Ambulatory Visit (INDEPENDENT_AMBULATORY_CARE_PROVIDER_SITE_OTHER): Payer: Medicare Other | Admitting: Nurse Practitioner

## 2022-09-28 ENCOUNTER — Encounter: Payer: Self-pay | Admitting: Nurse Practitioner

## 2022-09-28 VITALS — BP 130/65 | HR 69 | Ht 63.0 in | Wt 125.2 lb

## 2022-09-28 DIAGNOSIS — J42 Unspecified chronic bronchitis: Secondary | ICD-10-CM

## 2022-09-28 DIAGNOSIS — J449 Chronic obstructive pulmonary disease, unspecified: Secondary | ICD-10-CM | POA: Insufficient documentation

## 2022-09-28 DIAGNOSIS — F419 Anxiety disorder, unspecified: Secondary | ICD-10-CM

## 2022-09-28 DIAGNOSIS — D500 Iron deficiency anemia secondary to blood loss (chronic): Secondary | ICD-10-CM | POA: Diagnosis not present

## 2022-09-28 DIAGNOSIS — Z23 Encounter for immunization: Secondary | ICD-10-CM

## 2022-09-28 MED ORDER — HYDROXYZINE PAMOATE 25 MG PO CAPS: 25.0000 mg | ORAL_CAPSULE | Freq: Every day | ORAL | 0 refills | Status: DC

## 2022-09-28 NOTE — Progress Notes (Signed)
Established Patient Office Visit  Subjective:  Patient ID: Daisy Holloway, female    DOB: 05/02/1929  Age: 88 y.o. MRN: [id number]  CC:  Chief Complaint  Patient presents with   Follow-up    Patient is using 2liters of oxygen at home and is needing new oxygen order. Patient has been receiving through [facility] and is no longer in network with [facility]. Patient needs order faxed to      HPI  Daisy Holloway presents for routine follow up.  She has history of asthma, COPD, osteoporosis and GERD.  Patient never smoked but she had exposure to industrial dust. She is on 2 L of oxygen at home since and a new oxygen order. She complaint of being anxious and not able to sleep good.   HPI   Past Medical History:  Diagnosis Date   Anemia    IDA   Arthritis    Asthma    GERD (gastroesophageal reflux disease)    Iron deficiency anemia due to chronic blood loss 04/01/2015   Osteoporosis     Past Surgical History:  Procedure Laterality Date   ABDOMINAL HYSTERECTOMY  1964   ovaries left in   BREAST BIOPSY Right 1998   patient states calcifications-benign   CATARACT EXTRACTION     one eye in 2007 and the other eye 2012   CHOLECYSTECTOMY     KNEE SURGERY Right 2004   KNEE SURGERY Left 2012   [place]    Family History  Problem Relation Age of Onset   Liver cancer Mother    Arthritis Mother    Gastric cancer Father    Asthma Father    Diabetes Sister    Diabetes Brother    Breast cancer Maternal Aunt    Breast cancer Cousin     Social History   Socioeconomic History   Marital status: Married    Spouse name: Not on file   Number of children: Not on file   Years of education: Not on file   Highest education level: Not on file  Occupational History   Not on file  Tobacco Use   Smoking status: Never   Smokeless tobacco: Never  Vaping Use   Vaping Use: Never used  Substance and Sexual Activity   Alcohol use: Yes    Comment: occ. wine   Drug use: No   Sexual  activity: Not on file  Other Topics Concern   Not on file  Social History Narrative   Not on file   Social Determinants of Health   Financial Resource Strain: Low Risk  (08/11/2021)   Overall Financial Resource Strain (CARDIA)    Difficulty of Paying Living Expenses: Not very hard  Food Insecurity: No Food Insecurity (08/11/2021)   Hunger Vital Sign    Worried About Running Out of Food in the Last Year: Never true    Ran Out of Food in the Last Year: Never true  Transportation Needs: No Transportation Needs (08/11/2021)   PRAPARE - Hydrologist (Medical): No    Lack of Transportation (Non-Medical): No  Physical Activity: Insufficiently Active (08/11/2021)   Exercise Vital Sign    Days of Exercise per Week: 2 days    Minutes of Exercise per Session: 10 min  Stress: No Stress Concern Present (08/11/2021)   [employer]    Feeling of Stress : Only a little  Social Connections: Moderately Integrated (08/11/2021)  Social Licensed conveyancer [NHANES]    Frequency of Communication with Friends and Family: More than three times a week    Frequency of Social Gatherings with Friends and Family: More than three times a week    Attends Religious Services: 1 to 4 times per year    Active Member of [employer] or Organizations: No    Attends Archivist Meetings: Never    Marital Status: Married  Human resources officer Violence: Not At Risk (08/11/2021)   Humiliation, Afraid, Rape, and Kick questionnaire    Fear of Current or Ex-Partner: No    Emotionally Abused: No    Physically Abused: No    Sexually Abused: No     Outpatient Medications Prior to Visit  Medication Sig Dispense Refill   albuterol (VENTOLIN HFA) 108 (90 Base) MCG/ACT inhaler USE 2 INHALATIONS BY MOUTH  INTO THE LUNGS 4 TIMES  DAILY AS NEEDED 34 g 3   aspirin EC 81 MG tablet Take 81 mg by mouth daily.     atorvastatin (LIPITOR) 10  MG tablet TAKE 1 TABLET BY MOUTH  DAILY 90 tablet 3   Cholecalciferol (VITAMIN D3) 2000 UNITS capsule Take 5,000 Units by mouth daily.      diltiazem (CARDIZEM CD) 120 MG 24 hr capsule TAKE 1 CAPSULE BY MOUTH  DAILY 100 capsule 2   fluticasone (FLONASE) 50 MCG/ACT nasal spray Place 1 spray into both nostrils in the morning and at bedtime. 48 g 3   fluticasone-salmeterol (WIXELA INHUB) 250-50 MCG/ACT AEPB Inhale 1 puff into the lungs in the morning and at bedtime. 180 each 3   ipratropium-albuterol (DUONEB) 0.5-2.5 (3) MG/3ML SOLN Take 3 mLs by nebulization See admin instructions. 360 mL 8   Multiple Vitamin (MULTI-VITAMINS) TABS Take by mouth. Reported on 06/13/2016     omeprazole (PRILOSEC) 20 MG capsule TAKE 1 CAPSULE BY MOUTH TWICE  DAILY 200 capsule 2   vitamin B-12 (CYANOCOBALAMIN) 1000 MCG tablet Take by mouth.     Fluticasone-Salmeterol (WIXELA INHUB) 250-50 MCG/DOSE AEPB Inhale 1 puff into the lungs 2 (two) times daily. 180 each 3   loratadine (CLARITIN) 10 MG tablet Take 10 mg by mouth daily. Reported on 06/13/2016     Omega-3 Fatty Acids (FISH OIL) 1000 MG CPDR Take by mouth.     Glycerin-Hypromellose-PEG 400 0.2-0.2-1 % SOLN Apply to eye.     nystatin-triamcinolone (MYCOLOG II) cream APPLY TO AFFECTED AREA TWICE A DAY  5   sodium chloride (OCEAN) 0.65 % nasal spray Place into the nose.     Umeclidinium-Vilanterol (ANORO ELLIPTA IN) Inhale 1 puff into the lungs daily.     No facility-administered medications prior to visit.    Allergies  Allergen Reactions   Sulfa Antibiotics Hives    Other reaction(s): Unknown Other reaction(s): Unknown    ROS Review of Systems  Constitutional: Negative.   HENT: Negative.    Respiratory:  Negative for chest tightness and shortness of breath.   Cardiovascular:  Negative for chest pain and palpitations.  Gastrointestinal: Negative.   Genitourinary: Negative.   Musculoskeletal: Negative.   Neurological:  Negative for dizziness, facial  asymmetry and headaches.  Psychiatric/Behavioral:  Positive for sleep disturbance. Negative for agitation, behavioral problems and confusion. The patient is nervous/anxious.       Objective:    Physical Exam Constitutional:      Appearance: Normal appearance. She is obese.  HENT:     Head: Normocephalic.     Right Ear: Tympanic membrane normal.  Left Ear: Tympanic membrane normal.     Nose: Nose normal.     Mouth/Throat:     Mouth: Mucous membranes are moist.     Pharynx: Oropharynx is clear.  Eyes:     Extraocular Movements: Extraocular movements intact.     Conjunctiva/sclera: Conjunctivae normal.     Pupils: Pupils are equal, round, and reactive to light.  Cardiovascular:     Rate and Rhythm: Normal rate and regular rhythm.     Pulses: Normal pulses.     Heart sounds: Normal heart sounds.  Pulmonary:     Effort: Pulmonary effort is normal. No respiratory distress.     Breath sounds: Normal breath sounds. No rhonchi.  Abdominal:     General: Bowel sounds are normal.     Palpations: Abdomen is soft. There is no mass.     Tenderness: There is no abdominal tenderness.     Hernia: No hernia is present.  Musculoskeletal:        General: Normal range of motion.     Cervical back: Neck supple. No tenderness.  Skin:    General: Skin is warm.     Capillary Refill: Capillary refill takes less than 2 seconds.  Neurological:     General: No focal deficit present.     Mental Status: She is alert and oriented to person, place, and time. Mental status is at baseline.  Psychiatric:        Mood and Affect: Mood normal.        Behavior: Behavior normal.        Thought Content: Thought content normal.        Judgment: Judgment normal.     BP 130/65   Pulse 69   Ht '5\' 3"'$  (1.6 m)   Wt 125 lb 3.2 oz (56.8 kg)   BMI 22.18 kg/m  Wt Readings from Last 3 Encounters:  09/28/22 125 lb 3.2 oz (56.8 kg)  06/19/22 123 lb (55.8 kg)  06/23/21 133 lb 3.2 oz (60.4 kg)     Health  Maintenance  Topic Date Due   DEXA SCAN  Never done   COVID-19 Vaccine (4 - Pfizer risk series) 11/03/2020   Medicare Annual Wellness (AWV)  08/11/2022   Zoster Vaccines- Shingrix (1 of 2) 12/29/2022 (Originally 05/01/1948)   TETANUS/TDAP  09/29/2023 (Originally 05/01/1948)   Pneumonia Vaccine 67+ Years old  Completed   HPV VACCINES  Aged Out    There are no preventive care reminders to display for this patient.  No results found for: "TSH" Lab Results  Component Value Date   WBC 6.6 06/19/2022   HGB 12.8 06/19/2022   HCT 40.7 06/19/2022   MCV 85.5 06/19/2022   PLT 143 (L) 06/19/2022   Lab Results  Component Value Date   NA 143 03/17/2021   K 4.9 03/17/2021   CO2 28 03/17/2021   GLUCOSE 128 (H) 03/17/2021   BUN 15 03/17/2021   CREATININE 0.85 03/17/2021   BILITOT 0.4 03/17/2021   ALKPHOS 95 11/16/2017   AST 21 03/17/2021   ALT 13 03/17/2021   PROT 6.7 03/17/2021   ALBUMIN 4.1 11/16/2017   CALCIUM 9.7 03/17/2021   ANIONGAP 9 11/16/2017   No results found for: "CHOL" No results found for: "HDL" No results found for: "LDLCALC" No results found for: "TRIG" No results found for: "CHOLHDL" No results found for: "HGBA1C"    Assessment & Plan:   Problem List Items Addressed This Visit       Respiratory  Chronic obstructive pulmonary disease (HCC)    Stable on medication. Continue the current medication.      Relevant Orders   For home use only DME oxygen     Other   Iron deficiency anemia due to chronic blood loss    Stable at present.      Anxiety    Started her on Hydroxyzine 25 mg at bed time. Advised her to perform breathing exercises and meditation.        Relevant Medications   hydrOXYzine (VISTARIL) 25 MG capsule   Other Visit Diagnoses     Need for influenza vaccination    -  Primary   Relevant Orders   Flu Vaccine QUAD High Dose(Fluad) (Completed)        Meds ordered this encounter  Medications   hydrOXYzine (VISTARIL) 25 MG  capsule    Sig: Take 1 capsule (25 mg total) by mouth at bedtime.    Dispense:  30 capsule    Refill:  0     Follow-up: No follow-ups on file.    Theresia Lo, NP

## 2022-09-28 NOTE — Assessment & Plan Note (Addendum)
Started her on Hydroxyzine 25 mg at bed time. Advised her to perform breathing exercises and meditation.

## 2022-09-29 NOTE — Assessment & Plan Note (Signed)
Stable at present.   

## 2022-09-29 NOTE — Assessment & Plan Note (Signed)
Stable on medication. Continue the current medication.

## 2022-10-12 ENCOUNTER — Other Ambulatory Visit: Payer: Self-pay | Admitting: Nurse Practitioner

## 2022-10-21 DIAGNOSIS — R0600 Dyspnea, unspecified: Secondary | ICD-10-CM | POA: Diagnosis not present

## 2022-11-14 ENCOUNTER — Other Ambulatory Visit: Payer: Self-pay | Admitting: Internal Medicine

## 2022-11-20 DIAGNOSIS — R0600 Dyspnea, unspecified: Secondary | ICD-10-CM | POA: Diagnosis not present

## 2022-12-06 ENCOUNTER — Ambulatory Visit
Admission: EM | Admit: 2022-12-06 | Discharge: 2022-12-06 | Disposition: A | Discharge status: Home / Self Care | Payer: Medicare Other | Attending: Emergency Medicine | Admitting: Emergency Medicine

## 2022-12-06 ENCOUNTER — Ambulatory Visit (INDEPENDENT_AMBULATORY_CARE_PROVIDER_SITE_OTHER): Payer: Medicare Other

## 2022-12-06 DIAGNOSIS — R0602 Shortness of breath: Secondary | ICD-10-CM | POA: Diagnosis not present

## 2022-12-06 DIAGNOSIS — J189 Pneumonia, unspecified organism: Secondary | ICD-10-CM

## 2022-12-06 DIAGNOSIS — K449 Diaphragmatic hernia without obstruction or gangrene: Secondary | ICD-10-CM | POA: Diagnosis not present

## 2022-12-06 MED ORDER — PREDNISONE 20 MG PO TABS: 40.0000 mg | ORAL_TABLET | Freq: Every day | ORAL | 0 refills | Status: DC

## 2022-12-06 MED ORDER — METHYLPREDNISOLONE SODIUM SUCC 125 MG IJ SOLR
60.0000 mg | Freq: Once | INTRAMUSCULAR | Status: AC
  Administered 2022-12-06: 60 mg via INTRAMUSCULAR

## 2022-12-06 MED ORDER — BENZONATATE 100 MG PO CAPS: 100.0000 mg | ORAL_CAPSULE | Freq: Three times a day (TID) | ORAL | 0 refills | Status: DC

## 2022-12-06 MED ORDER — AMOXICILLIN-POT CLAVULANATE 875-125 MG PO TABS: 1.0000 | ORAL_TABLET | Freq: Two times a day (BID) | ORAL | 0 refills | Status: DC

## 2022-12-06 MED ORDER — ALBUTEROL SULFATE (2.5 MG/3ML) 0.083% IN NEBU: 2.5000 mg | INHALATION_SOLUTION | Freq: Four times a day (QID) | RESPIRATORY_TRACT | 12 refills | Status: DC | PRN

## 2022-12-06 MED ORDER — AZITHROMYCIN 250 MG PO TABS: 250.0000 mg | ORAL_TABLET | Freq: Every day | ORAL | 0 refills | Status: DC

## 2022-12-06 NOTE — Discharge Instructions (Addendum)
You are being treated for pneumonia to your right lower lobe confirmed on x-ray  Begin use of Augmentin every morning and every evening for 7 days  Take azithromycin as directed additionally to provide more bacterial coverage  Starting tomorrow take prednisone every morning with food for 5 days to help relax the airway  You may use Tessalon pill every 8 hours to help calm your coughing  May use nebulizer solution every 4-6 hours as needed for shortness or wheezing Please schedule follow-up appointment with your primary doctor for repeat evaluation in 10 days

## 2022-12-06 NOTE — ED Triage Notes (Signed)
Pt c/o cough x6days  Pt has COPD and states that she has to sit up to breath at night.  Pt has home oxygen and it is set to 2.0L  Pt states that she was around family who was sick, they were not tested for flu or covid.  Pt has been taking albuterol inhaler and Mucinex for the cough  Pt denies facial pain or pressure and states that everything "is in her lungs".

## 2022-12-06 NOTE — ED Provider Notes (Signed)
[facility]    CSN: [id number] Arrival date & time: 12/06/22  1621      History   Chief Complaint Chief Complaint  Patient presents with   Cough    HPI Daisy Holloway is a 89 y.o. female.   presents for evaluation of fever, sore throat, nasal congestion, rhinorrhea productive cough, shortness of breath and wheezing productive cough for 6 days.associated nausea with decreased appetite but was able to tolerate some foods today.  Shortness of breath and wheezing are experienced at baseline but slightly worsened.  Has had difficulty lying flat at night to sleep, uses 2 L of oxygen at baseline.  Has been taking daily inhalers as directed, endorses that she did not want to use her nebulizer medication as it is out of date which typically is helpful.  Denies chest pain or tightness.  History of COPD.    Past Medical History:  Diagnosis Date   Anemia    IDA   Arthritis    Asthma    GERD (gastroesophageal reflux disease)    Iron deficiency anemia due to chronic blood loss 04/01/2015   Osteoporosis     Patient Active Problem List   Diagnosis Date Noted   Chronic obstructive pulmonary disease ([place]) 09/28/2022   Anxiety 09/28/2022   Post-COVID syndrome resolved 06/23/2021   GERD (gastroesophageal reflux disease)    Annual physical exam 09/16/2020   Mitral valve disease 09/16/2020   Chronic bronchitis ([place]) 07/19/2020   Anxiety as acute reaction to exceptional stress 04/16/2020   Chronic fatigue 04/16/2020   Iron deficiency anemia due to chronic blood loss 04/01/2015    Past Surgical History:  Procedure Laterality Date   ABDOMINAL HYSTERECTOMY  1964   ovaries left in   BREAST BIOPSY Right 1998   patient states calcifications-benign   CATARACT EXTRACTION     one eye in 2007 and the other eye 2012   CHOLECYSTECTOMY     KNEE SURGERY Right 2004   KNEE SURGERY Left 2012   [place]    OB History   No obstetric history on file.      Home  Medications    Prior to Admission medications   Medication Sig Start Date End Date Taking? Authorizing Provider  albuterol (VENTOLIN HFA) 108 (90 Base) MCG/ACT inhaler USE 2 INHALATIONS BY MOUTH  INTO THE LUNGS 4 TIMES  DAILY AS NEEDED 03/13/22  Yes Masoud, Viann Shove, MD  aspirin EC 81 MG tablet Take 81 mg by mouth daily.   Yes [provider]  atorvastatin (LIPITOR) 10 MG tablet TAKE 1 TABLET BY MOUTH  DAILY 03/30/22  Yes Masoud, Viann Shove, MD  Cholecalciferol (VITAMIN D3) 2000 UNITS capsule Take 5,000 Units by mouth daily.    Yes [provider]  diltiazem (CARDIZEM CD) 120 MG 24 hr capsule TAKE 1 CAPSULE BY MOUTH  DAILY 06/07/22  Yes Masoud, Viann Shove, MD  fluticasone (FLONASE) 50 MCG/ACT nasal spray Place 1 spray into both nostrils in the morning and at bedtime. 03/25/21  Yes Masoud, Viann Shove, MD  fluticasone-salmeterol (ADVAIR) 250-50 MCG/ACT AEPB INHALE 1 INHALATION BY MOUTH  INTO THE LUNGS IN THE MORNING  AND AT BEDTIME 11/14/22  Yes Masoud, Viann Shove, MD  hydrOXYzine (VISTARIL) 25 MG capsule Take 1 capsule (25 mg total) by mouth at bedtime. 09/28/22  Yes Theresia Lo, NP  ipratropium-albuterol (DUONEB) 0.5-2.5 (3) MG/3ML SOLN Take 3 mLs by nebulization See admin instructions. 02/04/21  Yes Cletis Athens, MD  Multiple Vitamin (MULTI-VITAMINS) TABS Take by  mouth. Reported on 06/13/2016   Yes [provider]  Omega-3 Fatty Acids (FISH OIL) 1000 MG CPDR Take by mouth.   Yes [provider]  omeprazole (PRILOSEC) 20 MG capsule TAKE 1 CAPSULE BY MOUTH TWICE  DAILY 09/13/22  Yes Masoud, Viann Shove, MD  vitamin B-12 (CYANOCOBALAMIN) 1000 MCG tablet Take by mouth.   Yes [provider]    Family History Family History  Problem Relation Age of Onset   Liver cancer Mother    Arthritis Mother    Gastric cancer Father    Asthma Father    Diabetes Sister    Diabetes Brother    Breast cancer Maternal Aunt    Breast cancer Cousin     Social History Social History    Tobacco Use   Smoking status: Never   Smokeless tobacco: Never  Vaping Use   Vaping Use: Never used  Substance Use Topics   Alcohol use: Yes    Comment: occ. wine   Drug use: No     Allergies   Sulfa antibiotics   Review of Systems Review of Systems  HENT:  Positive for congestion, rhinorrhea and sore throat. Negative for dental problem, drooling, ear discharge, ear pain, facial swelling, hearing loss, mouth sores, nosebleeds, postnasal drip, sinus pressure, sinus pain, sneezing, tinnitus, trouble swallowing and voice change.   Respiratory:  Positive for cough, shortness of breath and wheezing. Negative for apnea, choking, chest tightness and stridor.   Cardiovascular: Negative.   Gastrointestinal: Negative.      Physical Exam Triage Vital Signs ED Triage Vitals  Enc Vitals Group     BP 12/06/22 1817 (!) 125/53     Pulse Rate 12/06/22 1817 71     Resp 12/06/22 1817 18     Temp 12/06/22 1817 98.1 F (36.7 C)     Temp Source 12/06/22 1817 Oral     SpO2 12/06/22 1817 90 %     Weight 12/06/22 1815 120 lb (54.4 kg)     Height 12/06/22 1815 '5\' 4"'$  (1.626 m)     Head Circumference --      Peak Flow --      Pain Score 12/06/22 1814 7     Pain Loc --      Pain Edu? --      Excl. in [place]? --    No data found.  Updated Vital Signs BP (!) 125/53 (BP Location: Left Arm)   Pulse 71   Temp 98.1 F (36.7 C) (Oral)   Resp 18   Ht '5\' 4"'$  (1.626 m)   Wt 120 lb (54.4 kg)   SpO2 90%   BMI 20.60 kg/m   Visual Acuity Right Eye Distance:   Left Eye Distance:   Bilateral Distance:    Right Eye Near:   Left Eye Near:    Bilateral Near:     Physical Exam HENT:     Head: Normocephalic.     Right Ear: Tympanic membrane, ear canal and external ear normal.     Left Ear: Tympanic membrane, ear canal and external ear normal.     Nose: Nose normal.     Mouth/Throat:     Mouth: Mucous membranes are moist.     Pharynx: Oropharynx is clear.  Eyes:     Extraocular Movements:  Extraocular movements intact.  Cardiovascular:     Rate and Rhythm: Normal rate and regular rhythm.     Pulses: Normal pulses.     Heart sounds: Normal heart sounds.  Pulmonary:     Effort: Pulmonary effort is normal.     Breath sounds: Rhonchi present.  Skin:    General: Skin is warm and dry.  Neurological:     Mental Status: She is alert and oriented to person, place, and time. Mental status is at baseline.      UC Treatments / Results  Labs (all labs ordered are listed, but only abnormal results are displayed) Labs Reviewed - No data to display  EKG   Radiology No results found.  Procedures Procedures (including critical care time)  Medications Ordered in UC Medications - No data to display  Initial Impression / Assessment and Plan / UC Course  I have reviewed the triage vital signs and the nursing notes.  Pertinent labs & imaging results that were available during my care of the patient were reviewed by me and considered in my medical decision making (see chart for details).  Many acquired pneumonia right lower lobe  Vital signs are stable, O2 saturation 90%, patient is in no signs of distress nontoxic-appearing, rhonchi heart to lungs, chest x-ray compatible pneumonia, discussed with patient and family, prescribed azithromycin, Augmentin, Tessalon and prednisone for management, refilled nebulizer medication and advised to use as needed, given strict precautions that if no improvement she is to go to the nearest emergency department for management and advise follow-up with PCP posttreatment, verbalized understanding Final Clinical Impressions(s) / UC Diagnoses   Final diagnoses:  None   Discharge Instructions   None    ED Prescriptions   None    PDMP not reviewed this encounter.   Hans Eden, NP 12/06/22 1955

## 2022-12-07 ENCOUNTER — Ambulatory Visit: Payer: Medicare Other | Admitting: Nurse Practitioner

## 2022-12-13 ENCOUNTER — Ambulatory Visit (INDEPENDENT_AMBULATORY_CARE_PROVIDER_SITE_OTHER): Payer: Medicare Other

## 2022-12-13 ENCOUNTER — Ambulatory Visit
Admission: RE | Admit: 2022-12-13 | Discharge: 2022-12-13 | Disposition: A | Discharge status: Home / Self Care | Payer: Medicare Other | Source: Ambulatory Visit | Attending: Family Medicine | Admitting: Family Medicine

## 2022-12-13 VITALS — BP 130/75 | HR 113 | Temp 97.5°F | Resp 18

## 2022-12-13 DIAGNOSIS — R051 Acute cough: Secondary | ICD-10-CM | POA: Diagnosis not present

## 2022-12-13 DIAGNOSIS — J441 Chronic obstructive pulmonary disease with (acute) exacerbation: Secondary | ICD-10-CM | POA: Diagnosis not present

## 2022-12-13 DIAGNOSIS — K449 Diaphragmatic hernia without obstruction or gangrene: Secondary | ICD-10-CM | POA: Diagnosis not present

## 2022-12-13 DIAGNOSIS — J439 Emphysema, unspecified: Secondary | ICD-10-CM | POA: Diagnosis not present

## 2022-12-13 DIAGNOSIS — R059 Cough, unspecified: Secondary | ICD-10-CM | POA: Diagnosis not present

## 2022-12-13 DIAGNOSIS — R0602 Shortness of breath: Secondary | ICD-10-CM | POA: Diagnosis not present

## 2022-12-13 LAB — BASIC METABOLIC PANEL
Anion gap: 10 (ref 5–15)
BUN: 34 mg/dL — ABNORMAL HIGH (ref 8–23)
CO2: 24 mmol/L (ref 22–32)
Calcium: 8.9 mg/dL (ref 8.9–10.3)
Chloride: 96 mmol/L — ABNORMAL LOW (ref 98–111)
Creatinine, Ser: 0.73 mg/dL (ref 0.44–1.00)
GFR, Estimated: >60 mL/min (ref >60)
Glucose, Bld: 106 mg/dL — ABNORMAL HIGH (ref 70–99)
Potassium: 4.2 mmol/L (ref 3.5–5.1)
Sodium: 130 mmol/L — ABNORMAL LOW (ref 135–145)

## 2022-12-13 LAB — CBC WITH DIFFERENTIAL/PLATELET
Abs Immature Granulocytes: 0.18 10*3/uL — ABNORMAL HIGH (ref 0.00–0.07)
Basophils Absolute: 0.1 10*3/uL (ref 0.0–0.1)
Basophils Relative: 0 %
Eosinophils Absolute: 0.1 10*3/uL (ref 0.0–0.5)
Eosinophils Relative: 1 %
HCT: 45.3 % (ref 36.0–46.0)
Hemoglobin: 14.9 g/dL (ref 12.0–15.0)
Immature Granulocytes: 2 %
Lymphocytes Relative: 13 %
Lymphs Abs: 1.5 10*3/uL (ref 0.7–4.0)
MCH: 27.5 pg (ref 26.0–34.0)
MCHC: 32.9 g/dL (ref 30.0–36.0)
MCV: 83.7 fL (ref 80.0–100.0)
Monocytes Absolute: 0.7 10*3/uL (ref 0.1–1.0)
Monocytes Relative: 6 %
Neutro Abs: 9.1 10*3/uL — ABNORMAL HIGH (ref 1.7–7.7)
Neutrophils Relative %: 78 %
Platelets: 282 10*3/uL (ref 150–400)
RBC: 5.41 MIL/uL — ABNORMAL HIGH (ref 3.87–5.11)
RDW: 14.4 % (ref 11.5–15.5)
WBC: 11.7 10*3/uL — ABNORMAL HIGH (ref 4.0–10.5)
nRBC: 0 % (ref 0.0–0.2)

## 2022-12-13 LAB — BRAIN NATRIURETIC PEPTIDE: B Natriuretic Peptide: 215.1 pg/mL — ABNORMAL HIGH (ref 0.0–100.0)

## 2022-12-13 MED ORDER — PREDNISONE 10 MG (21) PO TBPK: ORAL_TABLET | Freq: Every day | ORAL | 0 refills | Status: DC

## 2022-12-13 MED ORDER — IPRATROPIUM-ALBUTEROL 0.5-2.5 (3) MG/3ML IN SOLN: 3.0000 mL | RESPIRATORY_TRACT | 8 refills | Status: DC

## 2022-12-13 MED ORDER — DOXYCYCLINE HYCLATE 100 MG PO CAPS: 100.0000 mg | ORAL_CAPSULE | Freq: Two times a day (BID) | ORAL | 0 refills | Status: AC

## 2022-12-13 NOTE — ED Provider Notes (Signed)
[facility]    CSN: [id number] Arrival date & time: 12/13/22  1441      History   Chief Complaint Chief Complaint  Patient presents with   Cough    Pneumonia, checkup - Entered by patient    HPI Daisy Holloway is a 89 y.o. female.   HPI   Daisy Holloway brought in by daughter for shortness of breath and worsening productive cough.  Daisy Holloway was diagnosed with pneumonia last week and has taken both of her antibiotics and her steroid course.  States she has been using her nebulizers and inhalers more frequently.  She uses 2.5 L of oxygen at bedtime.  There has been no recent fever, nasal congestion, rhinorrhea, nausea, vomiting or diarrhea.  The other day she felt lightheaded and needed to lay down.  She has history of A-fib and has felt her heart palpitating. Says her HR at home was in the 110s but it came down to the 70s.  She follows with cardiology but not pulmonology. Says her cardiologist told her he can manage her lung problems. Her cardiologist is retiring and her daughter would like her mom to see a lung specialist.        Past Medical History:  Diagnosis Date   Anemia    IDA   Arthritis    Asthma    GERD (gastroesophageal reflux disease)    Iron deficiency anemia due to chronic blood loss 04/01/2015   Osteoporosis     Patient Active Problem List   Diagnosis Date Noted   Chronic obstructive pulmonary disease ([place]) 09/28/2022   Anxiety 09/28/2022   Post-COVID syndrome resolved 06/23/2021   GERD (gastroesophageal reflux disease)    Annual physical exam 09/16/2020   Mitral valve disease 09/16/2020   Chronic bronchitis ([place]) 07/19/2020   Anxiety as acute reaction to exceptional stress 04/16/2020   Chronic fatigue 04/16/2020   Iron deficiency anemia due to chronic blood loss 04/01/2015    Past Surgical History:  Procedure Laterality Date   ABDOMINAL HYSTERECTOMY  1964   ovaries left in   BREAST BIOPSY Right 1998   patient states calcifications-benign    CATARACT EXTRACTION     one eye in 2007 and the other eye 2012   CHOLECYSTECTOMY     KNEE SURGERY Right 2004   KNEE SURGERY Left 2012   [place]    OB History   No obstetric history on file.      Home Medications    Prior to Admission medications   Medication Sig Start Date End Date Taking? Authorizing Provider  doxycycline (VIBRAMYCIN) 100 MG capsule Take 1 capsule (100 mg total) by mouth 2 (two) times daily for 5 days. 12/13/22 12/18/22 Yes Brownie Gockel, DO  predniSONE (STERAPRED UNI-PAK 21 TAB) 10 MG (21) TBPK tablet Take by mouth daily. Take 6 tabs by mouth daily for 1, then 5 tabs for 1 day, then 4 tabs for 1 day, then 3 tabs for 1 day, then 2 tabs for 1 day, then 1 tab for 1 day. 12/13/22  Yes Hazle Ogburn, DO  albuterol (PROVENTIL) (2.5 MG/3ML) 0.083% nebulizer solution Take 3 mLs (2.5 mg total) by nebulization every 6 (six) hours as needed for wheezing or shortness of breath. 12/06/22   White, Leitha Schuller, NP  albuterol (VENTOLIN HFA) 108 (90 Base) MCG/ACT inhaler USE 2 INHALATIONS BY MOUTH  INTO THE LUNGS 4 TIMES  DAILY AS NEEDED 03/13/22   Cletis Athens, MD  aspirin EC 81 MG tablet  Take 81 mg by mouth daily.    [provider]  atorvastatin (LIPITOR) 10 MG tablet TAKE 1 TABLET BY MOUTH  DAILY 03/30/22   Cletis Athens, MD  benzonatate (TESSALON) 100 MG capsule Take 1 capsule (100 mg total) by mouth every 8 (eight) hours. 12/06/22   Hans Eden, NP  Cholecalciferol (VITAMIN D3) 2000 UNITS capsule Take 5,000 Units by mouth daily.     [provider]  diltiazem (CARDIZEM CD) 120 MG 24 hr capsule TAKE 1 CAPSULE BY MOUTH  DAILY 06/07/22   Cletis Athens, MD  fluticasone (FLONASE) 50 MCG/ACT nasal spray Place 1 spray into both nostrils in the morning and at bedtime. 03/25/21   Cletis Athens, MD  fluticasone-salmeterol (ADVAIR) 250-50 MCG/ACT AEPB INHALE 1 INHALATION BY MOUTH  INTO THE LUNGS IN THE MORNING  AND AT BEDTIME 11/14/22   Cletis Athens, MD   hydrOXYzine (VISTARIL) 25 MG capsule Take 1 capsule (25 mg total) by mouth at bedtime. 09/28/22   Theresia Lo, NP  ipratropium-albuterol (DUONEB) 0.5-2.5 (3) MG/3ML SOLN Take 3 mLs by nebulization See admin instructions. 12/13/22   Lyndee Hensen, DO  Multiple Vitamin (MULTI-VITAMINS) TABS Take by mouth. Reported on 06/13/2016    [provider]  Omega-3 Fatty Acids (FISH OIL) 1000 MG CPDR Take by mouth.    [provider]  omeprazole (PRILOSEC) 20 MG capsule TAKE 1 CAPSULE BY MOUTH TWICE  DAILY 09/13/22   Cletis Athens, MD  vitamin B-12 (CYANOCOBALAMIN) 1000 MCG tablet Take by mouth.    [provider]    Family History Family History  Problem Relation Age of Onset   Liver cancer Mother    Arthritis Mother    Gastric cancer Father    Asthma Father    Diabetes Sister    Diabetes Brother    Breast cancer Maternal Aunt    Breast cancer Cousin     Social History Social History   Tobacco Use   Smoking status: Never   Smokeless tobacco: Never  Vaping Use   Vaping Use: Never used  Substance Use Topics   Alcohol use: Yes    Comment: occ. wine   Drug use: No     Allergies   Sulfa antibiotics   Review of Systems Review of Systems: negative unless otherwise stated in HPI.      Physical Exam Triage Vital Signs ED Triage Vitals  Enc Vitals Group     BP 12/13/22 1459 130/75     Pulse Rate 12/13/22 1459 (!) 113     Resp 12/13/22 1459 18     Temp 12/13/22 1459 (!) 97.5 F (36.4 C)     Temp Source 12/13/22 1459 Oral     SpO2 12/13/22 1459 95 %     Weight --      Height --      Head Circumference --      Peak Flow --      Pain Score 12/13/22 1458 0     Pain Loc --      Pain Edu? --      Excl. in [place]? --    No data found.  Updated Vital Signs BP 130/75 (BP Location: Left Arm)   Pulse (!) 113   Temp (!) 97.5 F (36.4 C) (Oral)   Resp 18   SpO2 95%   Visual Acuity Right Eye Distance:   Left Eye Distance:   Bilateral Distance:     Right Eye Near:   Left Eye Near:  Bilateral Near:     Physical Exam GEN:     alert, non-toxic appearing elderly female in no distress    HENT:  mucus membranes moist, no nasal discharge EYES:   no scleral injection or discharge NECK:  ROM at baseline  RESP:  no increased work of breathing, diffuse expiratory wheezing, no rales, no rhonchi CVS:  Tachycardic, irregularly irregular rhythm, no JVP Skin:   warm and dry    UC Treatments / Results  Labs (all labs ordered are listed, but only abnormal results are displayed) Labs Reviewed  CBC WITH DIFFERENTIAL/PLATELET - Abnormal; Notable for the following components:      Result Value   WBC 11.7 (*)    RBC 5.41 (*)    Neutro Abs 9.1 (*)    Abs Immature Granulocytes 0.18 (*)    All other components within normal limits  BASIC METABOLIC PANEL - Abnormal; Notable for the following components:   Sodium 130 (*)    Chloride 96 (*)    Glucose, Bld 106 (*)    BUN 34 (*)    All other components within normal limits  BRAIN NATRIURETIC PEPTIDE    EKG   Radiology DG Chest 2 View  Result Date: 12/13/2022 CLINICAL DATA:  Cough and shortness of breath. EXAM: CHEST - 2 VIEW COMPARISON:  12/06/2022 FINDINGS: The cardiac silhouette, mediastinal and hilar contours are normal and stable. Stable moderate to large hiatal hernia. Stable emphysematous changes with moderate hyperinflation. The right lower lobe infiltrate stain on the prior study has resolved. No acute bony findings. IMPRESSION: Resolution of right lower lobe infiltrate. No new/acute pulmonary process. Stable emphysematous changes. Electronically Signed   By: Marijo Sanes M.D.   On: 12/13/2022 15:51    Procedures Procedures (including critical care time)  Medications Ordered in UC Medications - No data to display  Initial Impression / Assessment and Plan / UC Course  I have reviewed the triage vital signs and the nursing notes.  Pertinent labs & imaging results that were  available during my care of the patient were reviewed by me and considered in my medical decision making (see chart for details).       Pt is a 89 y.o. female with COPD, AFIB who presents for 1 week of shortness of breath and cough with recent diagnosis of pneumonia. Daisy Holloway is afebrile here without recent antipyretics. Satting adequately on room air. She is tachycardic in the setting of AFIB with increased albuterol nebulizer and inhaler use.  Overall pt is non-toxic appearing, well hydrated, without respiratory distress. Pulmonary exam is remarkable for diffuse expiratory wheezing.  COVID and influenza testing deferred due to duration of symptoms.  She has leukocytosis with left shift, WBC 11.7.  She has a mild hypochloremic hyponatremia, NA 130 and Cl 96.   BNP pending.  She appears to be euvolemic on exam.  After shared decision making we obtained chest xray.  Radiologist notes resolution of right lower lobe pneumonia.  I suspect that she has an acute exacerbation of her COPD.  Treat with antibiotics and steroids as below.  Offered DuoNeb utilizer here but patient declined.  Refilled her DuoNebs as she does not have any at home.  Referral to pulmonology placed.  Continue using home oxygen at bedtime as previously directed.   Return and ED precautions given and voiced understanding. Discussed MDM, treatment plan and plan for follow-up with patient/daughter who agrees with plan.     Final Clinical Impressions(s) / UC Diagnoses   Final  diagnoses:  Acute cough  COPD exacerbation [facility])     Discharge Instructions      Stop by the pharmacy to pick up your prescriptions.  Follow up with your primary care provider as needed.  If you have been referred to a specialist, it may take 1 to 2 weeks to schedule/process the referral.  If you have not heard from the specialist in 2 weeks, please follow-up with your primary care provider for a new referral.       ED Prescriptions     Medication  Sig Dispense Auth. Provider   ipratropium-albuterol (DUONEB) 0.5-2.5 (3) MG/3ML SOLN Take 3 mLs by nebulization See admin instructions. 360 mL Ericka Marcellus, DO   predniSONE (STERAPRED UNI-PAK 21 TAB) 10 MG (21) TBPK tablet Take by mouth daily. Take 6 tabs by mouth daily for 1, then 5 tabs for 1 day, then 4 tabs for 1 day, then 3 tabs for 1 day, then 2 tabs for 1 day, then 1 tab for 1 day. 21 tablet Kiptyn Rafuse, DO   doxycycline (VIBRAMYCIN) 100 MG capsule Take 1 capsule (100 mg total) by mouth 2 (two) times daily for 5 days. 10 capsule Lyndee Hensen, DO      PDMP not reviewed this encounter.   Lyndee Hensen, DO 12/13/22 1829

## 2022-12-13 NOTE — ED Triage Notes (Signed)
Patient presents to [facility] for follow-up. Seen 01/03, dx with pneumonia. Treated with inhaler and antibiotics. Hx of COPD, uses oxygen at home. Pt states continued cough. Req chest x-ray. Treating cough with neb treatments.   Denies fever or SOB.

## 2022-12-13 NOTE — Discharge Instructions (Addendum)
Stop by the pharmacy to pick up your prescriptions.  Follow up with your primary care provider as needed.  If you have been referred to a specialist, it may take 1 to 2 weeks to schedule/process the referral.  If you have not heard from the specialist in 2 weeks, please follow-up with your primary care provider for a new referral.

## 2022-12-21 DIAGNOSIS — R0600 Dyspnea, unspecified: Secondary | ICD-10-CM | POA: Diagnosis not present

## 2023-01-01 ENCOUNTER — Ambulatory Visit: Payer: Medicare Other | Admitting: Student in an Organized Health Care Education/Training Program

## 2023-01-01 ENCOUNTER — Encounter: Payer: Self-pay | Admitting: Student in an Organized Health Care Education/Training Program

## 2023-01-01 VITALS — BP 110/58 | HR 93 | Temp 98.2°F | Ht 64.0 in | Wt 126.0 lb

## 2023-01-01 DIAGNOSIS — R0602 Shortness of breath: Secondary | ICD-10-CM | POA: Diagnosis not present

## 2023-01-01 NOTE — Progress Notes (Signed)
Synopsis: Referred in for cough by Lyndee Hensen, DO  Assessment & Plan:   #Shortness of breath #Cough #HFpEF #Afib #Aortic Stenosis  She is reporting symptoms of cough productive of clear sputum after recent URTI some associated exertional dyspnea.  Her lung exam is clear and she has no wheezing, rales or rhonchi.  I suspect that her cough will improve and have recommended that she continue to use her albuterol as needed.  I did discuss with the patient the utility of obtaining further diagnostics to include a pulmonary function test as well as a CT scan of the chest.  We will observe her symptoms for the next 3 weeks and should they not improve or should they worsen, we will proceed with further workup. While she might have had bronchitis, I don't suspect COPD given lack of smoking in her history. Lung parenchyma on the limited lung windows from previous abdominal CT's was normal.  Furthermore, the patient does have a report of atrial fibrillation in the medical record as well as an echocardiogram at [facility] from July 2023 showing moderate aortic stenosis, dilated left atrium, and signs of diastolic dysfunction concerning for heart failure with preserved ejection fraction.  She does have lower extremity edema on exam but no rales on auscultation of her lungs. She felt dizzy with ambulation which could be cardiac in origin.  Given all of this, the patient would benefit from a referral to cardiology for further evaluation as she does not have an established cardiologist and her primary care physician is retiring.  - Ambulatory referral to Cardiology   Return in about 4 weeks (around 01/29/2023).  I spent 60 minutes caring for this patient today, including preparing to see the patient, obtaining a medical history , reviewing a separately obtained history, performing a medically appropriate examination and/or evaluation, counseling and educating the patient/family/caregiver, ordering medications,  tests, or procedures, documenting clinical information in the electronic health record, and independently interpreting results (not separately reported/billed) and communicating results to the patient/family/caregiver  Armando Reichert, MD [facility] 01/01/2023 4:16 PM    End of visit medications:  No orders of the defined types were placed in this encounter.    Current Outpatient Medications:    albuterol (VENTOLIN HFA) 108 (90 Base) MCG/ACT inhaler, USE 2 INHALATIONS BY MOUTH  INTO THE LUNGS 4 TIMES  DAILY AS NEEDED, Disp: 34 g, Rfl: 3   aspirin EC 81 MG tablet, Take 81 mg by mouth daily., Disp: , Rfl:    atorvastatin (LIPITOR) 10 MG tablet, TAKE 1 TABLET BY MOUTH  DAILY, Disp: 90 tablet, Rfl: 3   benzonatate (TESSALON) 100 MG capsule, Take 1 capsule (100 mg total) by mouth every 8 (eight) hours., Disp: 21 capsule, Rfl: 0   Cholecalciferol (VITAMIN D3) 2000 UNITS capsule, Take 5,000 Units by mouth daily. , Disp: , Rfl:    diltiazem (CARDIZEM CD) 120 MG 24 hr capsule, TAKE 1 CAPSULE BY MOUTH  DAILY, Disp: 100 capsule, Rfl: 2   fluticasone (FLONASE) 50 MCG/ACT nasal spray, Place 1 spray into both nostrils in the morning and at bedtime., Disp: 48 g, Rfl: 3   fluticasone-salmeterol (ADVAIR) 250-50 MCG/ACT AEPB, INHALE 1 INHALATION BY MOUTH  INTO THE LUNGS IN THE MORNING  AND AT BEDTIME, Disp: 200 each, Rfl: 1   hydrOXYzine (VISTARIL) 25 MG capsule, Take 1 capsule (25 mg total) by mouth at bedtime., Disp: 30 capsule, Rfl: 0   ipratropium-albuterol (DUONEB) 0.5-2.5 (3) MG/3ML SOLN, Take 3 mLs by  nebulization See admin instructions., Disp: 360 mL, Rfl: 8   Multiple Vitamin (MULTI-VITAMINS) TABS, Take by mouth. Reported on 06/13/2016, Disp: , Rfl:    Omega-3 Fatty Acids (FISH OIL) 1000 MG CPDR, Take by mouth., Disp: , Rfl:    omeprazole (PRILOSEC) 20 MG capsule, TAKE 1 CAPSULE BY MOUTH TWICE  DAILY, Disp: 200 capsule, Rfl: 2   vitamin B-12 (CYANOCOBALAMIN) 1000 MCG tablet, Take  by mouth., Disp: , Rfl:    Subjective:   PATIENT ID: Daisy Holloway GENDER: female DOB: 07/09/1930, MRN: [id number]  Chief Complaint  Patient presents with   pulmonary consult    [facility] visit 12/06/2022 and 1/10/204 dx with PNA. Sx have improved with prednisone and abx. SOB with exertion, prod cough with clear sputum and wheezing.     HPI  The patient is a pleasant 89 year old female presenting to clinic for the evaluation of cough.  She reports that her symptoms started early January when she developed symptoms of cough, exertional dyspnea, and some wheezing.  At that time, she was exposed to her sick daughter (URTI).  She was seen in urgent care and was prescribed a course of antibiotics and prednisone given a right lower lobe infiltrate on chest x-ray.  Her symptoms did not improve and she was again seen in urgent care on 12/13/2018 for and given another course of prednisone and antibiotics.  At this point, her main symptom is that of cough productive of clear sputum.  She feels dizzy and weak and a little short of breath with exertion.  She does not have any fevers or chills, denies any chest pain, but does report some shoulder/back pain.  She does not have any appetite and has lost a significant amount of weight.    She was admitted to the hospital in July 2024 at [facility] due to acute hypoxic respiratory failure where she was treated for pneumonia.  Given she was hypoxic, she was discharged on oxygen therapy as well as as needed albuterol.  She was previously given a diagnosis of COPD/bronchitis though she is a non-smoker and there has not been pulmonary function testing.  Other medical problems include reflux disease, hiatal hernia, anxiety, and anemia. TTE from 06/2022 notable for aortic stenosis and HFpEF. The medical record is notable for history of Afib.  She is a lifelong non-smoker and currently lives in [place].  She previously worked at [employer] with some exposure to formaldehyde.  Ancillary  information including prior medications, full medical/surgical/family/social histories, and PFTs (when available) are listed below and have been reviewed.   Review of Systems  Constitutional:  Positive for weight loss. Negative for chills and fever.  Cardiovascular:  Negative for chest pain and palpitations.     Objective:   Vitals:   01/01/23 1519  BP: (!) 110/58  Pulse: 93  Temp: 98.2 F (36.8 C)  TempSrc: Temporal  SpO2: 94%  Weight: 126 lb (57.2 kg)  Height: '5\' 4"'$  (8.768 m)   94% on RA remained stable after trending on RA.   BMI Readings from Last 3 Encounters:  01/01/23 21.63 kg/m  12/06/22 20.60 kg/m  09/28/22 22.18 kg/m   Wt Readings from Last 3 Encounters:  01/01/23 126 lb (57.2 kg)  12/06/22 120 lb (54.4 kg)  09/28/22 125 lb 3.2 oz (56.8 kg)    Physical Exam Constitutional:      General: She is not in acute distress.    Appearance: Normal appearance. She is not ill-appearing.  HENT:  Head: Normocephalic.     Nose: Nose normal.     Mouth/Throat:     Mouth: Mucous membranes are moist.  Cardiovascular:     Rate and Rhythm: Normal rate. Rhythm irregular.  Pulmonary:     Effort: Pulmonary effort is normal. No respiratory distress.     Breath sounds: Normal breath sounds. No wheezing or rales.  Abdominal:     Palpations: Abdomen is soft.  Musculoskeletal:        General: Normal range of motion.     Right lower leg: Edema present.     Left lower leg: Edema present.  Neurological:     General: No focal deficit present.     Mental Status: She is alert and oriented to person, place, and time. Mental status is at baseline.     Ancillary Information    Past Medical History:  Diagnosis Date   Anemia    IDA   Arthritis    Asthma    GERD (gastroesophageal reflux disease)    Iron deficiency anemia due to chronic blood loss 04/01/2015   Osteoporosis      Family History  Problem Relation Age of Onset   Liver cancer Mother    Arthritis Mother     Gastric cancer Father    Asthma Father    Diabetes Sister    Diabetes Brother    Breast cancer Maternal Aunt    Breast cancer Cousin      Past Surgical History:  Procedure Laterality Date   ABDOMINAL HYSTERECTOMY  1964   ovaries left in   BREAST BIOPSY Right 1998   patient states calcifications-benign   CATARACT EXTRACTION     one eye in 2007 and the other eye 2012   CHOLECYSTECTOMY     KNEE SURGERY Right 2004   KNEE SURGERY Left 2012   [place]    Social History   Socioeconomic History   Marital status: Married    Spouse name: Not on file   Number of children: Not on file   Years of education: Not on file   Highest education level: Not on file  Occupational History   Not on file  Tobacco Use   Smoking status: Never   Smokeless tobacco: Never  Vaping Use   Vaping Use: Never used  Substance and Sexual Activity   Alcohol use: Yes    Comment: occ. wine   Drug use: No   Sexual activity: Not on file  Other Topics Concern   Not on file  Social History Narrative   Not on file   Social Determinants of Health   Financial Resource Strain: Low Risk  (08/11/2021)   Overall Financial Resource Strain (CARDIA)    Difficulty of Paying Living Expenses: Not very hard  Food Insecurity: No Food Insecurity (08/11/2021)   Hunger Vital Sign    Worried About Running Out of Food in the Last Year: Never true    Ran Out of Food in the Last Year: Never true  Transportation Needs: No Transportation Needs (08/11/2021)   PRAPARE - Hydrologist (Medical): No    Lack of Transportation (Non-Medical): No  Physical Activity: Insufficiently Active (08/11/2021)   Exercise Vital Sign    Days of Exercise per Week: 2 days    Minutes of Exercise per Session: 10 min  Stress: No Stress Concern Present (08/11/2021)   [place]    Feeling of Stress : Only  a little  Social Connections: Moderately  Integrated (08/11/2021)   Social Connection and Isolation Panel [NHANES]    Frequency of Communication with Friends and Family: More than three times a week    Frequency of Social Gatherings with Friends and Family: More than three times a week    Attends Religious Services: 1 to 4 times per year    Active Member of [employer] or Organizations: No    Attends Archivist Meetings: Never    Marital Status: Married  Human resources officer Violence: Not At Risk (08/11/2021)   Humiliation, Afraid, Rape, and Kick questionnaire    Fear of Current or Ex-Partner: No    Emotionally Abused: No    Physically Abused: No    Sexually Abused: No     Allergies  Allergen Reactions   Sulfa Antibiotics Hives    Other reaction(s): Unknown Other reaction(s): Unknown     CBC    Component Value Date/Time   WBC 11.7 (H) 12/13/2022 1532   RBC 5.41 (H) 12/13/2022 1532   HGB 14.9 12/13/2022 1532   HGB 9.2 (L) 12/24/2014 0953   HCT 45.3 12/13/2022 1532   HCT 29.7 (L) 12/24/2014 0953   PLT 282 12/13/2022 1532   PLT 421 12/24/2014 0953   MCV 83.7 12/13/2022 1532   MCV 73 (L) 12/24/2014 0953   MCH 27.5 12/13/2022 1532   MCHC 32.9 12/13/2022 1532   RDW 14.4 12/13/2022 1532   RDW 18.9 (H) 12/24/2014 0953   LYMPHSABS 1.5 12/13/2022 1532   LYMPHSABS 1.8 12/24/2014 0953   MONOABS 0.7 12/13/2022 1532   MONOABS 0.6 12/24/2014 0953   EOSABS 0.1 12/13/2022 1532   EOSABS 0.1 12/24/2014 0953   BASOSABS 0.1 12/13/2022 1532   BASOSABS 0.0 12/24/2014 0953    Pulmonary Functions Testing Results:     No data to display          Outpatient Medications Prior to Visit  Medication Sig Dispense Refill   albuterol (VENTOLIN HFA) 108 (90 Base) MCG/ACT inhaler USE 2 INHALATIONS BY MOUTH  INTO THE LUNGS 4 TIMES  DAILY AS NEEDED 34 g 3   aspirin EC 81 MG tablet Take 81 mg by mouth daily.     atorvastatin (LIPITOR) 10 MG tablet TAKE 1 TABLET BY MOUTH  DAILY 90 tablet 3   benzonatate (TESSALON) 100 MG capsule Take 1  capsule (100 mg total) by mouth every 8 (eight) hours. 21 capsule 0   Cholecalciferol (VITAMIN D3) 2000 UNITS capsule Take 5,000 Units by mouth daily.      diltiazem (CARDIZEM CD) 120 MG 24 hr capsule TAKE 1 CAPSULE BY MOUTH  DAILY 100 capsule 2   fluticasone (FLONASE) 50 MCG/ACT nasal spray Place 1 spray into both nostrils in the morning and at bedtime. 48 g 3   fluticasone-salmeterol (ADVAIR) 250-50 MCG/ACT AEPB INHALE 1 INHALATION BY MOUTH  INTO THE LUNGS IN THE MORNING  AND AT BEDTIME 200 each 1   hydrOXYzine (VISTARIL) 25 MG capsule Take 1 capsule (25 mg total) by mouth at bedtime. 30 capsule 0   ipratropium-albuterol (DUONEB) 0.5-2.5 (3) MG/3ML SOLN Take 3 mLs by nebulization See admin instructions. 360 mL 8   Multiple Vitamin (MULTI-VITAMINS) TABS Take by mouth. Reported on 06/13/2016     Omega-3 Fatty Acids (FISH OIL) 1000 MG CPDR Take by mouth.     omeprazole (PRILOSEC) 20 MG capsule TAKE 1 CAPSULE BY MOUTH TWICE  DAILY 200 capsule 2   vitamin B-12 (CYANOCOBALAMIN) 1000 MCG tablet Take by  mouth.     albuterol (PROVENTIL) (2.5 MG/3ML) 0.083% nebulizer solution Take 3 mLs (2.5 mg total) by nebulization every 6 (six) hours as needed for wheezing or shortness of breath. 75 mL 12   predniSONE (STERAPRED UNI-PAK 21 TAB) 10 MG (21) TBPK tablet Take by mouth daily. Take 6 tabs by mouth daily for 1, then 5 tabs for 1 day, then 4 tabs for 1 day, then 3 tabs for 1 day, then 2 tabs for 1 day, then 1 tab for 1 day. 21 tablet 0   No facility-administered medications prior to visit.

## 2023-01-21 DIAGNOSIS — R0600 Dyspnea, unspecified: Secondary | ICD-10-CM | POA: Diagnosis not present

## 2023-01-30 ENCOUNTER — Ambulatory Visit: Payer: Medicare Other | Admitting: Student in an Organized Health Care Education/Training Program

## 2023-01-30 VITALS — BP 128/60 | HR 67 | Temp 97.9°F | Ht 64.0 in | Wt 127.4 lb

## 2023-01-30 DIAGNOSIS — R0602 Shortness of breath: Secondary | ICD-10-CM | POA: Diagnosis not present

## 2023-01-30 NOTE — Progress Notes (Signed)
Assessment & Plan:   #Shortness of breath #Cough #HFpEF #Afib #Aortic Stenosis   Daisy Holloway was reporting symptoms of cough productive of clear sputum after recent URTI with some associated exertional dyspnea. This has mostly resolved, and Daisy Holloway feels her symptoms are 8-9/10 improved. Her lung exam is clear and Daisy Holloway has no wheezing, rales or rhonchi. While Daisy Holloway might have had bronchitis, I don't suspect COPD given lack of smoking in her history. Lung parenchyma on the limited lung windows from previous abdominal CT's was normal. Daisy Holloway has been using advair which Daisy Holloway will continue.   Furthermore, the patient does have a report of atrial fibrillation in the medical record as well as an echocardiogram at [facility] from July 2023 showing moderate aortic stenosis, dilated left atrium, and signs of diastolic dysfunction concerning for heart failure with preserved ejection fraction.  Daisy Holloway does have lower extremity edema on exam but no rales on auscultation of her lungs. Daisy Holloway felt dizzy with ambulation which could be cardiac in origin.  Given all of this, the patient would benefit from a referral to cardiology for further evaluation as Daisy Holloway does not have an established cardiologist and her primary care physician is retiring. Daisy Holloway is scheduled to see Dr. Garen Lah this week.   Return in about 5 months (around 07/05/2023).  I spent 25 minutes caring for this patient today, including preparing to see the patient, obtaining a medical history , reviewing a separately obtained history, performing a medically appropriate examination and/or evaluation, counseling and educating the patient/family/caregiver, and documenting clinical information in the electronic health record  Armando Reichert, MD [facility] 01/30/2023 3:07 PM    End of visit medications:  No orders of the defined types were placed in this encounter.    Current Outpatient Medications:    albuterol (VENTOLIN HFA) 108 (90 Base) MCG/ACT  inhaler, USE 2 INHALATIONS BY MOUTH  INTO THE LUNGS 4 TIMES  DAILY AS NEEDED, Disp: 34 g, Rfl: 3   aspirin EC 81 MG tablet, Take 81 mg by mouth daily., Disp: , Rfl:    atorvastatin (LIPITOR) 10 MG tablet, TAKE 1 TABLET BY MOUTH  DAILY, Disp: 90 tablet, Rfl: 3   Cholecalciferol (VITAMIN D3) 2000 UNITS capsule, Take 5,000 Units by mouth daily. , Disp: , Rfl:    diltiazem (CARDIZEM CD) 120 MG 24 hr capsule, TAKE 1 CAPSULE BY MOUTH  DAILY, Disp: 100 capsule, Rfl: 2   fluticasone-salmeterol (ADVAIR) 250-50 MCG/ACT AEPB, INHALE 1 INHALATION BY MOUTH  INTO THE LUNGS IN THE MORNING  AND AT BEDTIME, Disp: 200 each, Rfl: 1   hydrOXYzine (VISTARIL) 25 MG capsule, Take 1 capsule (25 mg total) by mouth at bedtime., Disp: 30 capsule, Rfl: 0   ipratropium-albuterol (DUONEB) 0.5-2.5 (3) MG/3ML SOLN, Take 3 mLs by nebulization See admin instructions., Disp: 360 mL, Rfl: 8   Multiple Vitamin (MULTI-VITAMINS) TABS, Take by mouth. Reported on 06/13/2016, Disp: , Rfl:    Omega-3 Fatty Acids (FISH OIL) 1000 MG CPDR, Take by mouth., Disp: , Rfl:    omeprazole (PRILOSEC) 20 MG capsule, TAKE 1 CAPSULE BY MOUTH TWICE  DAILY, Disp: 200 capsule, Rfl: 2   vitamin B-12 (CYANOCOBALAMIN) 1000 MCG tablet, Take by mouth., Disp: , Rfl:    benzonatate (TESSALON) 100 MG capsule, Take 1 capsule (100 mg total) by mouth every 8 (eight) hours. (Patient not taking: Reported on 01/30/2023), Disp: 21 capsule, Rfl: 0   fluticasone (FLONASE) 50 MCG/ACT nasal spray, Place 1 spray into both nostrils in the morning  and at bedtime. (Patient not taking: Reported on 01/30/2023), Disp: 48 g, Rfl: 3   Subjective:   PATIENT ID: Daisy Holloway GENDER: female DOB: 01-04-1930, MRN: [id number]  Chief Complaint  Patient presents with   Follow-up    Occ SOB with exertion.    HPI  The patient is a pleasant 89 year old female presenting to clinic for follow up on cough. I first saw her on 01/01/2023.  Daisy Holloway reports that her symptoms started early January  when Daisy Holloway developed symptoms of cough, exertional dyspnea, and some wheezing.  At that time, Daisy Holloway was exposed to her sick daughter (URTI).  Daisy Holloway was seen in urgent care and was prescribed a course of antibiotics and prednisone given a right lower lobe infiltrate on chest x-ray.  Her symptoms did not improve and Daisy Holloway was again seen in urgent care on 12/13/2018 for and given another course of prednisone and antibiotics.  Her cough, which was the most distressing symptom, is now mostly resolved. Daisy Holloway still does report some dizziness with activity. Daisy Holloway mostly feels tired. Daisy Holloway does not have any fevers or chills, denies any chest pain, but does report some shoulder/back pain.  Daisy Holloway does not have any appetite and has lost a significant amount of weight.     Daisy Holloway was admitted to the hospital in July 2024 at [facility] due to acute hypoxic respiratory failure where Daisy Holloway was treated for pneumonia.  Given Daisy Holloway was hypoxic, Daisy Holloway was discharged on oxygen therapy as well as as needed albuterol.  Daisy Holloway was previously given a diagnosis of COPD/bronchitis though Daisy Holloway is a non-smoker and there has not been pulmonary function testing.  Other medical problems include reflux disease, hiatal hernia, anxiety, and anemia. TTE from 06/2022 notable for aortic stenosis and HFpEF. The medical record is notable for history of Afib.   Daisy Holloway is a lifelong non-smoker and currently lives in [place].  Daisy Holloway previously worked at [employer] with some exposure to formaldehyde.  Ancillary information including prior medications, full medical/surgical/family/social histories, and PFTs (when available) are listed below and have been reviewed.   Review of Systems  Constitutional:  Positive for weight loss. Negative for chills and fever.  Cardiovascular:  Negative for chest pain and palpitations.     Objective:   Vitals:   01/30/23 1450  BP: 128/60  Pulse: 67  Temp: 97.9 F (36.6 C)  TempSrc: Temporal  SpO2: 96%  Weight: 127 lb 6.4 oz (57.8 kg)  Height: '5\' 4"'$   (1.626 m)   96% on RA  BMI Readings from Last 3 Encounters:  01/30/23 21.87 kg/m  01/01/23 21.63 kg/m  12/06/22 20.60 kg/m   Wt Readings from Last 3 Encounters:  01/30/23 127 lb 6.4 oz (57.8 kg)  01/01/23 126 lb (57.2 kg)  12/06/22 120 lb (54.4 kg)    Physical Exam Constitutional:      General: Daisy Holloway is not in acute distress.    Appearance: Normal appearance. Daisy Holloway is not ill-appearing.  HENT:     Head: Normocephalic.     Nose: Nose normal.     Mouth/Throat:     Mouth: Mucous membranes are moist.  Cardiovascular:     Rate and Rhythm: Normal rate. Rhythm irregular.  Pulmonary:     Effort: Pulmonary effort is normal. No respiratory distress.     Breath sounds: Normal breath sounds. No wheezing or rales.  Abdominal:     Palpations: Abdomen is soft.  Musculoskeletal:        General: Normal range of motion.  Right lower leg: Edema present.     Left lower leg: Edema present.  Neurological:     General: No focal deficit present.     Mental Status: Daisy Holloway is alert and oriented to person, place, and time. Mental status is at baseline.       Ancillary Information    Past Medical History:  Diagnosis Date   Anemia    IDA   Arthritis    Asthma    GERD (gastroesophageal reflux disease)    Iron deficiency anemia due to chronic blood loss 04/01/2015   Osteoporosis      Family History  Problem Relation Age of Onset   Liver cancer Mother    Arthritis Mother    Gastric cancer Father    Asthma Father    Diabetes Sister    Diabetes Brother    Breast cancer Maternal Aunt    Breast cancer Cousin      Past Surgical History:  Procedure Laterality Date   ABDOMINAL HYSTERECTOMY  1964   ovaries left in   BREAST BIOPSY Right 1998   patient states calcifications-benign   CATARACT EXTRACTION     one eye in 2007 and the other eye 2012   CHOLECYSTECTOMY     KNEE SURGERY Right 2004   KNEE SURGERY Left 2012   [place]    Social History   Socioeconomic  History   Marital status: Married    Spouse name: Not on file   Number of children: Not on file   Years of education: Not on file   Highest education level: Not on file  Occupational History   Not on file  Tobacco Use   Smoking status: Never   Smokeless tobacco: Never  Vaping Use   Vaping Use: Never used  Substance and Sexual Activity   Alcohol use: Yes    Comment: occ. wine   Drug use: No   Sexual activity: Not on file  Other Topics Concern   Not on file  Social History Narrative   Not on file   Social Determinants of Health   Financial Resource Strain: Low Risk  (08/11/2021)   Overall Financial Resource Strain (CARDIA)    Difficulty of Paying Living Expenses: Not very hard  Food Insecurity: No Food Insecurity (08/11/2021)   Hunger Vital Sign    Worried About Running Out of Food in the Last Year: Never true    Ran Out of Food in the Last Year: Never true  Transportation Needs: No Transportation Needs (08/11/2021)   PRAPARE - Hydrologist (Medical): No    Lack of Transportation (Non-Medical): No  Physical Activity: Insufficiently Active (08/11/2021)   Exercise Vital Sign    Days of Exercise per Week: 2 days    Minutes of Exercise per Session: 10 min  Stress: No Stress Concern Present (08/11/2021)   [place]    Feeling of Stress : Only a little  Social Connections: Moderately Integrated (08/11/2021)   Social Connection and Isolation Panel [NHANES]    Frequency of Communication with Friends and Family: More than three times a week    Frequency of Social Gatherings with Friends and Family: More than three times a week    Attends Religious Services: 1 to 4 times per year    Active Member of [employer] or Organizations: No    Attends Archivist Meetings: Never    Marital Status: Married  Human resources officer  Violence: Not At Risk (08/11/2021)   Humiliation, Afraid, Rape, and Kick  questionnaire    Fear of Current or Ex-Partner: No    Emotionally Abused: No    Physically Abused: No    Sexually Abused: No     Allergies  Allergen Reactions   Sulfa Antibiotics Hives    Other reaction(s): Unknown Other reaction(s): Unknown     CBC    Component Value Date/Time   WBC 11.7 (H) 12/13/2022 1532   RBC 5.41 (H) 12/13/2022 1532   HGB 14.9 12/13/2022 1532   HGB 9.2 (L) 12/24/2014 0953   HCT 45.3 12/13/2022 1532   HCT 29.7 (L) 12/24/2014 0953   PLT 282 12/13/2022 1532   PLT 421 12/24/2014 0953   MCV 83.7 12/13/2022 1532   MCV 73 (L) 12/24/2014 0953   MCH 27.5 12/13/2022 1532   MCHC 32.9 12/13/2022 1532   RDW 14.4 12/13/2022 1532   RDW 18.9 (H) 12/24/2014 0953   LYMPHSABS 1.5 12/13/2022 1532   LYMPHSABS 1.8 12/24/2014 0953   MONOABS 0.7 12/13/2022 1532   MONOABS 0.6 12/24/2014 0953   EOSABS 0.1 12/13/2022 1532   EOSABS 0.1 12/24/2014 0953   BASOSABS 0.1 12/13/2022 1532   BASOSABS 0.0 12/24/2014 0953    Pulmonary Functions Testing Results:     No data to display          Outpatient Medications Prior to Visit  Medication Sig Dispense Refill   albuterol (VENTOLIN HFA) 108 (90 Base) MCG/ACT inhaler USE 2 INHALATIONS BY MOUTH  INTO THE LUNGS 4 TIMES  DAILY AS NEEDED 34 g 3   aspirin EC 81 MG tablet Take 81 mg by mouth daily.     atorvastatin (LIPITOR) 10 MG tablet TAKE 1 TABLET BY MOUTH  DAILY 90 tablet 3   Cholecalciferol (VITAMIN D3) 2000 UNITS capsule Take 5,000 Units by mouth daily.      diltiazem (CARDIZEM CD) 120 MG 24 hr capsule TAKE 1 CAPSULE BY MOUTH  DAILY 100 capsule 2   fluticasone-salmeterol (ADVAIR) 250-50 MCG/ACT AEPB INHALE 1 INHALATION BY MOUTH  INTO THE LUNGS IN THE MORNING  AND AT BEDTIME 200 each 1   hydrOXYzine (VISTARIL) 25 MG capsule Take 1 capsule (25 mg total) by mouth at bedtime. 30 capsule 0   ipratropium-albuterol (DUONEB) 0.5-2.5 (3) MG/3ML SOLN Take 3 mLs by nebulization See admin instructions. 360 mL 8   Multiple Vitamin  (MULTI-VITAMINS) TABS Take by mouth. Reported on 06/13/2016     Omega-3 Fatty Acids (FISH OIL) 1000 MG CPDR Take by mouth.     omeprazole (PRILOSEC) 20 MG capsule TAKE 1 CAPSULE BY MOUTH TWICE  DAILY 200 capsule 2   vitamin B-12 (CYANOCOBALAMIN) 1000 MCG tablet Take by mouth.     benzonatate (TESSALON) 100 MG capsule Take 1 capsule (100 mg total) by mouth every 8 (eight) hours. (Patient not taking: Reported on 01/30/2023) 21 capsule 0   fluticasone (FLONASE) 50 MCG/ACT nasal spray Place 1 spray into both nostrils in the morning and at bedtime. (Patient not taking: Reported on 01/30/2023) 48 g 3   No facility-administered medications prior to visit.

## 2023-02-02 ENCOUNTER — Ambulatory Visit: Payer: Medicare Other | Attending: Cardiology | Admitting: Cardiology

## 2023-02-02 ENCOUNTER — Other Ambulatory Visit
Admission: RE | Admit: 2023-02-02 | Discharge: 2023-02-02 | Disposition: A | Discharge status: Home / Self Care | Payer: Medicare Other | Source: Ambulatory Visit | Attending: Cardiology | Admitting: Cardiology

## 2023-02-02 ENCOUNTER — Encounter: Payer: Self-pay | Admitting: Cardiology

## 2023-02-02 VITALS — BP 114/68 | HR 72 | Ht 64.0 in | Wt 127.4 lb

## 2023-02-02 DIAGNOSIS — R42 Dizziness and giddiness: Secondary | ICD-10-CM | POA: Insufficient documentation

## 2023-02-02 DIAGNOSIS — R0602 Shortness of breath: Secondary | ICD-10-CM | POA: Diagnosis not present

## 2023-02-02 DIAGNOSIS — I350 Nonrheumatic aortic (valve) stenosis: Secondary | ICD-10-CM | POA: Insufficient documentation

## 2023-02-02 LAB — BASIC METABOLIC PANEL
Anion gap: 7 (ref 5–15)
BUN: 19 mg/dL (ref 8–23)
CO2: 30 mmol/L (ref 22–32)
Calcium: 9.1 mg/dL (ref 8.9–10.3)
Chloride: 102 mmol/L (ref 98–111)
Creatinine, Ser: 0.83 mg/dL (ref 0.44–1.00)
GFR, Estimated: >60 mL/min (ref >60)
Glucose, Bld: 112 mg/dL — ABNORMAL HIGH (ref 70–99)
Potassium: 4 mmol/L (ref 3.5–5.1)
Sodium: 139 mmol/L (ref 135–145)

## 2023-02-02 MED ORDER — FUROSEMIDE 20 MG PO TABS: 20.0000 mg | ORAL_TABLET | Freq: Every day | ORAL | 3 refills | Status: DC

## 2023-02-02 NOTE — Patient Instructions (Signed)
Medication Instructions:  Your physician has recommended you make the following change in your medication:   START - furosemide (LASIX) 20 MG tablet - Take 1 tablet (20 mg total) by mouth daily STOP - diltiazem (CARDIZEM CD) 120 MG 24 hr capsule  *If you need a refill on your cardiac medications before your next appointment, please call your pharmacy*   Lab Work: Your physician recommends that you return for lab work in 10 days: [place] at [facility] 1st desk on the right to check in (REGISTRATION)  Lab hours: Monday- Friday (7:30 am- 5:30 pm)   If you have labs (blood work) drawn today and your tests are completely normal, you will receive your results only by: MyChart Message (if you have MyChart) OR A paper copy in the mail If you have any lab test that is abnormal or we need to change your treatment, we will call you to review the results.   Testing/Procedures: Your physician has requested that you have an echocardiogram. Echocardiography is a painless test that uses sound waves to create images of your heart. It provides your doctor with information about the size and shape of your heart and how well your heart's chambers and valves are working. This procedure takes approximately one hour. There are no restrictions for this procedure. Please do NOT wear cologne, perfume, aftershave, or lotions (deodorant is allowed). Please arrive 15 minutes prior to your appointment time.    Follow-Up: At [facility], you and your health needs are our priority.  As part of our continuing mission to provide you with exceptional heart care, we have created designated Provider Care Teams.  These Care Teams include your primary Cardiologist (physician) and Advanced Practice Providers (APPs -  Physician Assistants and Nurse Practitioners) who all work together to provide you with the care you need, when you need it.  We recommend signing up for the patient portal called  "MyChart".  Sign up information is provided on this After Visit Summary.  MyChart is used to connect with patients for Virtual Visits (Telemedicine).  Patients are able to view lab/test results, encounter notes, upcoming appointments, etc.  Non-urgent messages can be sent to your provider as well.   To learn more about what you can do with MyChart, go to [email or web address].    Your next appointment:   After echocardiogram   Provider:   You may see Kate Sable, MD or one of the following Advanced Practice Providers on your designated Care Team:   Murray Hodgkins, NP Christell Faith, PA-C Cadence Kathlen Mody, PA-C Gerrie Nordmann, NP    Other Instructions -None

## 2023-02-02 NOTE — Progress Notes (Signed)
Cardiology Office Note:    Date:  02/02/2023   ID:  Daisy Holloway, DOB December 08, 1929, MRN [id number]  PCP:  Burnard Hawthorne, [address] Providers Cardiologist:  Kate Sable, MD     Referring MD: Armando Reichert, MD   Chief Complaint  Patient presents with   New Patient (Initial Visit)    SOB, Cardiac Hx, chest pains    History of Present Illness:    Daisy Holloway is a 89 y.o. female with a hx of GERD, aortic stenosis who presents with shortness of breath.    Endorse having shortness of breath with minimal exertion.  Presented to [facility] 6 months ago 06/2022 with with symptoms of shortness of breath.  Workup with echo revealed moderate aortic stenosis, normal EF, grade 1 diastolic dysfunction.  She was given oxygen which has helped with symptoms of shortness of breath although still present.  She uses oxygen at night for sleep.  Denies smoking, but endorses working in a [employer] for over 25 years.  Was exposed to formaldehyde during this period.  She thinks that might have caused some lung disease.  Also has an albuterol inhaler to use as needed which improves her symptoms.  She endorses orthopnea, sleeps with 2 pillows.  Denies leg edema.  Aspirin causes stomach irritation.  She takes Cardizem, unsure why, denies history of hypertension.  Echo 7/23 from [facility] EF over 55%, impaired relaxation, moderate aortic stenosis.  Past Medical History:  Diagnosis Date   Anemia    IDA   Arthritis    Asthma    GERD (gastroesophageal reflux disease)    Iron deficiency anemia due to chronic blood loss 04/01/2015   Osteoporosis     Past Surgical History:  Procedure Laterality Date   ABDOMINAL HYSTERECTOMY  1964   ovaries left in   BREAST BIOPSY Right 1998   patient states calcifications-benign   CATARACT EXTRACTION     one eye in 2007 and the other eye 2012   CHOLECYSTECTOMY     KNEE SURGERY Right 2004   KNEE SURGERY Left 2012   SHOULDER SURGERY  1995     Current Medications: Current Meds  Medication Sig   albuterol (VENTOLIN HFA) 108 (90 Base) MCG/ACT inhaler USE 2 INHALATIONS BY MOUTH  INTO THE LUNGS 4 TIMES  DAILY AS NEEDED   aspirin EC 81 MG tablet Take 81 mg by mouth daily.   atorvastatin (LIPITOR) 10 MG tablet TAKE 1 TABLET BY MOUTH  DAILY   Cholecalciferol (VITAMIN D3) 2000 UNITS capsule Take 5,000 Units by mouth daily.    fluticasone-salmeterol (ADVAIR) 250-50 MCG/ACT AEPB INHALE 1 INHALATION BY MOUTH  INTO THE LUNGS IN THE MORNING  AND AT BEDTIME   furosemide (LASIX) 20 MG tablet Take 1 tablet (20 mg total) by mouth daily.   hydrOXYzine (VISTARIL) 25 MG capsule Take 1 capsule (25 mg total) by mouth at bedtime.   ipratropium-albuterol (DUONEB) 0.5-2.5 (3) MG/3ML SOLN Take 3 mLs by nebulization See admin instructions.   Multiple Vitamin (MULTI-VITAMINS) TABS Take by mouth. Reported on 06/13/2016   omeprazole (PRILOSEC) 20 MG capsule TAKE 1 CAPSULE BY MOUTH TWICE  DAILY   vitamin B-12 (CYANOCOBALAMIN) 1000 MCG tablet Take by mouth.   [DISCONTINUED] diltiazem (CARDIZEM CD) 120 MG 24 hr capsule TAKE 1 CAPSULE BY MOUTH  DAILY     Allergies:   Sulfa antibiotics   Social History   Socioeconomic History   Marital status: Married    Spouse name:  Not on file   Number of children: Not on file   Years of education: Not on file   Highest education level: Not on file  Occupational History   Not on file  Tobacco Use   Smoking status: Never    Passive exposure: Past   Smokeless tobacco: Never  Vaping Use   Vaping Use: Never used  Substance and Sexual Activity   Alcohol use: Yes    Comment: occ. wine   Drug use: No   Sexual activity: Not on file  Other Topics Concern   Not on file  Social History Narrative   Not on file   Social Determinants of Health   Financial Resource Strain: Low Risk  (08/11/2021)   Overall Financial Resource Strain (CARDIA)    Difficulty of Paying Living Expenses: Not very hard  Food Insecurity: No  Food Insecurity (08/11/2021)   Hunger Vital Sign    Worried About Running Out of Food in the Last Year: Never true    Ran Out of Food in the Last Year: Never true  Transportation Needs: No Transportation Needs (08/11/2021)   PRAPARE - Hydrologist (Medical): No    Lack of Transportation (Non-Medical): No  Physical Activity: Insufficiently Active (08/11/2021)   Exercise Vital Sign    Days of Exercise per Week: 2 days    Minutes of Exercise per Session: 10 min  Stress: No Stress Concern Present (08/11/2021)   [place]    Feeling of Stress : Only a little  Social Connections: Moderately Integrated (08/11/2021)   Social Connection and Isolation Panel [NHANES]    Frequency of Communication with Friends and Family: More than three times a week    Frequency of Social Gatherings with Friends and Family: More than three times a week    Attends Religious Services: 1 to 4 times per year    Active Member of [employer] or Organizations: No    Attends Music therapist: Never    Marital Status: Married     Family History: The patient's family history includes Arthritis in her mother; Asthma in her father; Breast cancer in her cousin and maternal aunt; Diabetes in her brother and sister; Gastric cancer in her father; Liver cancer in her mother.  ROS:   Please see the history of present illness.     All other systems reviewed and are negative.  EKGs/Labs/Other Studies Reviewed:    The following studies were reviewed today:   EKG:  EKG is  ordered today.  The ekg ordered today demonstrates normal sinus rhythm, normal ECG.  Recent Labs: 12/13/2022: B Natriuretic Peptide 215.1; Hemoglobin 14.9; Platelets 282 02/02/2023: BUN 19; Creatinine, Ser 0.83; Potassium 4.0; Sodium 139  Recent Lipid Panel No results found for: "CHOL", "TRIG", "HDL", "CHOLHDL", "VLDL", "LDLCALC", "LDLDIRECT"   Risk  Assessment/Calculations:             Physical Exam:    VS:  BP 114/68 (BP Location: Right Arm)   Pulse 72   Ht '5\' 4"'$  (1.626 m)   Wt 127 lb 6.4 oz (57.8 kg)   BMI 21.87 kg/m     Wt Readings from Last 3 Encounters:  02/02/23 127 lb 6.4 oz (57.8 kg)  01/30/23 127 lb 6.4 oz (57.8 kg)  01/01/23 126 lb (57.2 kg)     GEN:  Well nourished, well developed in no acute distress HEENT: Normal NECK: No JVD; No carotid bruits CARDIAC: RRR,  3/6 systolic murmur RESPIRATORY: Diminished breath sound bilaterally, no wheezing. ABDOMEN: Soft, non-tender, non-distended MUSCULOSKELETAL:  No edema; No deformity  SKIN: Warm and dry NEUROLOGIC:  Alert and oriented x 3 PSYCHIATRIC:  Normal affect   ASSESSMENT:    1. SOB (shortness of breath)   2. Aortic valve stenosis, etiology of cardiac valve disease unspecified   3. Dizziness    PLAN:    In order of problems listed above:  Shortness of breath, never smoker.  Recent echo with moderate aortic stenosis.  Not sure if this is worsened.  Unsure if she has further interstitial disease due to exposure to formaldehyde and working in a factory x 25 years.  She endorses orthopnea.  Start Lasix 20 mg daily, check BMP in 10 days. Moderate aortic stenosis on echo 06/2022 at [facility].  Repeat echo to evaluate worsening valvular disease.  Lasix as above. Patient has occasional dizziness waking up in the morning.  Denies history of hypertension.  Stop Cardizem.  Echo as above.  Follow-up after echocardiogram.     Medication Adjustments/Labs and Tests Ordered: Current medicines are reviewed at length with the patient today.  Concerns regarding medicines are outlined above.  Orders Placed This Encounter  Procedures   Basic Metabolic Panel (BMET)   EKG 12-Lead   ECHOCARDIOGRAM COMPLETE   Meds ordered this encounter  Medications   furosemide (LASIX) 20 MG tablet    Sig: Take 1 tablet (20 mg total) by mouth daily.    Dispense:  90 tablet    Refill:  3     Patient Instructions  Medication Instructions:  Your physician has recommended you make the following change in your medication:   START - furosemide (LASIX) 20 MG tablet - Take 1 tablet (20 mg total) by mouth daily STOP - diltiazem (CARDIZEM CD) 120 MG 24 hr capsule  *If you need a refill on your cardiac medications before your next appointment, please call your pharmacy*   Lab Work: Your physician recommends that you return for lab work in 10 days: [place] at [facility] 1st desk on the right to check in (REGISTRATION)  Lab hours: Monday- Friday (7:30 am- 5:30 pm)   If you have labs (blood work) drawn today and your tests are completely normal, you will receive your results only by: MyChart Message (if you have MyChart) OR A paper copy in the mail If you have any lab test that is abnormal or we need to change your treatment, we will call you to review the results.   Testing/Procedures: Your physician has requested that you have an echocardiogram. Echocardiography is a painless test that uses sound waves to create images of your heart. It provides your doctor with information about the size and shape of your heart and how well your heart's chambers and valves are working. This procedure takes approximately one hour. There are no restrictions for this procedure. Please do NOT wear cologne, perfume, aftershave, or lotions (deodorant is allowed). Please arrive 15 minutes prior to your appointment time.    Follow-Up: At [facility], you and your health needs are our priority.  As part of our continuing mission to provide you with exceptional heart care, we have created designated Provider Care Teams.  These Care Teams include your primary Cardiologist (physician) and Advanced Practice Providers (APPs -  Physician Assistants and Nurse Practitioners) who all work together to provide you with the care you need, when you need it.  We recommend signing up for  the  patient portal called "MyChart".  Sign up information is provided on this After Visit Summary.  MyChart is used to connect with patients for Virtual Visits (Telemedicine).  Patients are able to view lab/test results, encounter notes, upcoming appointments, etc.  Non-urgent messages can be sent to your provider as well.   To learn more about what you can do with MyChart, go to [email or web address].    Your next appointment:   After echocardiogram   Provider:   You may see Kate Sable, MD or one of the following Advanced Practice Providers on your designated Care Team:   Murray Hodgkins, NP Christell Faith, PA-C Cadence Kathlen Mody, PA-C Gerrie Nordmann, NP    Other Instructions -None    Signed, Kate Sable, MD  02/02/2023 4:48 PM    Reform

## 2023-02-07 ENCOUNTER — Encounter: Payer: Self-pay | Admitting: Family

## 2023-02-07 ENCOUNTER — Ambulatory Visit (INDEPENDENT_AMBULATORY_CARE_PROVIDER_SITE_OTHER): Payer: Medicare Other | Admitting: Family

## 2023-02-07 VITALS — BP 118/70 | HR 64 | Temp 98.0°F | Ht 64.0 in | Wt 124.2 lb

## 2023-02-07 DIAGNOSIS — Z13220 Encounter for screening for lipoid disorders: Secondary | ICD-10-CM

## 2023-02-07 DIAGNOSIS — R0602 Shortness of breath: Secondary | ICD-10-CM | POA: Insufficient documentation

## 2023-02-07 DIAGNOSIS — R7309 Other abnormal glucose: Secondary | ICD-10-CM

## 2023-02-07 DIAGNOSIS — E538 Deficiency of other specified B group vitamins: Secondary | ICD-10-CM

## 2023-02-07 NOTE — Assessment & Plan Note (Signed)
She had recent consult with pulmonology and cardiology.  Pending echocardiogram.  Shortness of breath has improved after initiation of Lasix 10 mg which she is taking every other day.  Encouraged her to continue taking Lasix every other day until further notified by Dr Charlestine Night after results of echocardiogram.

## 2023-02-07 NOTE — Progress Notes (Signed)
Assessment & Plan:  SOB (shortness of breath) Assessment & Plan: She had recent consult with pulmonology and cardiology.  Pending echocardiogram.  Shortness of breath has improved after initiation of Lasix 10 mg which she is taking every other day.  Encouraged her to continue taking Lasix every other day until further notified by Dr Charlestine Night after results of echocardiogram.    Encounter for lipid screening for cardiovascular disease -     Lipid panel; Future -     Comprehensive metabolic panel; Future  Elevated glucose -     Hemoglobin A1c; Future  B12 deficiency -     B12 and Folate Panel; Future     Return precautions given.   Risks, benefits, and alternatives of the medications and treatment plan prescribed Holloway were discussed, and patient expressed understanding.   Education regarding symptom management and diagnosis given to patient on AVS either electronically or printed.  Return in about 6 weeks (around 03/21/2023).  Daisy Paris, FNP  Subjective:    Patient ID: Daisy Holloway, female    DOB: 08-16-1930, 89 y.o.   MRN: [id number]  CC: Daisy Holloway is a 89 y.o. female who presents Holloway to establish care.    HPI: Accompanied by daughter Daisy Holloway. No new complaints  Leg swelling resolved and Dizziness improved. She is no longer on cardizem.   She has started lasix and  using QOD as if using daily felt 'drained.'         Dr Charlestine Night establish care 02/02/2023.  H/o moderate aortic stenosis.  Started on Lasix 20 mg daily.  Repeating echo.  Stop Cardizem  She was seen by Dr. Genia Harold 01/30/2023 for shortness of breath.  He did not suspect COPD in the absence of smoking history.  He continued Advair.  He referred her to Dr Charlestine Night    Allergies: Sulfa antibiotics Current Outpatient Medications on File Prior to Visit  Medication Sig Dispense Refill   albuterol (VENTOLIN HFA) 108 (90 Base) MCG/ACT inhaler USE 2 INHALATIONS BY MOUTH  INTO THE LUNGS 4 TIMES   DAILY AS NEEDED 34 g 3   aspirin EC 81 MG tablet Take 81 mg by mouth daily.     atorvastatin (LIPITOR) 10 MG tablet TAKE 1 TABLET BY MOUTH  DAILY 90 tablet 3   Cholecalciferol (VITAMIN D3) 2000 UNITS capsule Take 5,000 Units by mouth daily.      fluticasone-salmeterol (ADVAIR) 250-50 MCG/ACT AEPB INHALE 1 INHALATION BY MOUTH  INTO THE LUNGS IN THE MORNING  AND AT BEDTIME 200 each 1   furosemide (LASIX) 20 MG tablet Take 1 tablet (20 mg total) by mouth daily. (Patient taking differently: Take 20 mg by mouth daily. Pt is taking 1 tablet every other day) 90 tablet 3   hydrOXYzine (VISTARIL) 25 MG capsule Take 1 capsule (25 mg total) by mouth at bedtime. 30 capsule 0   ipratropium-albuterol (DUONEB) 0.5-2.5 (3) MG/3ML SOLN Take 3 mLs by nebulization See admin instructions. 360 mL 8   Multiple Vitamin (MULTI-VITAMINS) TABS Take by mouth. Reported on 06/13/2016     omeprazole (PRILOSEC) 20 MG capsule TAKE 1 CAPSULE BY MOUTH TWICE  DAILY 200 capsule 2   vitamin B-12 (CYANOCOBALAMIN) 1000 MCG tablet Take by mouth.     No current facility-administered medications on file prior to visit.    Review of Systems  Constitutional:  Negative for chills and fever.  Respiratory:  Negative for cough and shortness of breath.   Cardiovascular:  Negative for chest  pain, palpitations and leg swelling.  Gastrointestinal:  Negative for nausea and vomiting.      Objective:    BP 118/70   Pulse 64   Temp 98 F (36.7 C) (Oral)   Ht '5\' 4"'$  (1.626 m)   Wt 124 lb 3.2 oz (56.3 kg)   SpO2 96%   BMI 21.32 kg/m  BP Readings from Last 3 Encounters:  02/07/23 118/70  02/02/23 114/68  01/30/23 128/60   Wt Readings from Last 3 Encounters:  02/07/23 124 lb 3.2 oz (56.3 kg)  02/02/23 127 lb 6.4 oz (57.8 kg)  01/30/23 127 lb 6.4 oz (57.8 kg)    Physical Exam Vitals reviewed.  Constitutional:      Appearance: She is well-developed.  Eyes:     Conjunctiva/sclera: Conjunctivae normal.  Cardiovascular:     Rate  and Rhythm: Normal rate and regular rhythm.     Pulses: Normal pulses.     Heart sounds: Normal heart sounds.  Pulmonary:     Effort: Pulmonary effort is normal.     Breath sounds: Normal breath sounds. No wheezing, rhonchi or rales.  Musculoskeletal:     Right lower leg: No edema.     Left lower leg: No edema.  Skin:    General: Skin is warm and dry.  Neurological:     Mental Status: She is alert.  Psychiatric:        Speech: Speech normal.        Behavior: Behavior normal.        Thought Content: Thought content normal.

## 2023-02-14 ENCOUNTER — Other Ambulatory Visit: Payer: Medicare Other

## 2023-02-14 ENCOUNTER — Other Ambulatory Visit (INDEPENDENT_AMBULATORY_CARE_PROVIDER_SITE_OTHER): Payer: Medicare Other

## 2023-02-14 DIAGNOSIS — Z136 Encounter for screening for cardiovascular disorders: Secondary | ICD-10-CM | POA: Diagnosis not present

## 2023-02-14 DIAGNOSIS — R7309 Other abnormal glucose: Secondary | ICD-10-CM | POA: Diagnosis not present

## 2023-02-14 DIAGNOSIS — Z13220 Encounter for screening for lipoid disorders: Secondary | ICD-10-CM | POA: Diagnosis not present

## 2023-02-14 DIAGNOSIS — E538 Deficiency of other specified B group vitamins: Secondary | ICD-10-CM

## 2023-02-14 LAB — COMPREHENSIVE METABOLIC PANEL
ALT: 13 U/L (ref 0–35)
AST: 18 U/L (ref 0–37)
Albumin: 3.9 g/dL (ref 3.5–5.2)
Alkaline Phosphatase: 76 U/L (ref 39–117)
BUN: 14 mg/dL (ref 6–23)
CO2: 34 mEq/L — ABNORMAL HIGH (ref 19–32)
Calcium: 9.6 mg/dL (ref 8.4–10.5)
Chloride: 103 mEq/L (ref 96–112)
Creatinine, Ser: 0.7 mg/dL (ref 0.40–1.20)
GFR: 75.94 mL/min (ref >60.00)
Glucose, Bld: 86 mg/dL (ref 70–99)
Potassium: 4.2 mEq/L (ref 3.5–5.1)
Sodium: 143 mEq/L (ref 135–145)
Total Bilirubin: 0.5 mg/dL (ref 0.2–1.2)
Total Protein: 6.3 g/dL (ref 6.0–8.3)

## 2023-02-14 LAB — LIPID PANEL
Cholesterol: 171 mg/dL (ref 0–200)
HDL: 90.8 mg/dL (ref >39.00)
LDL Cholesterol: 66 mg/dL (ref 0–99)
NonHDL: 79.87
Total CHOL/HDL Ratio: 2
Triglycerides: 69 mg/dL (ref 0.0–149.0)
VLDL: 13.8 mg/dL (ref 0.0–40.0)

## 2023-02-14 LAB — HEMOGLOBIN A1C: Hgb A1c MFr Bld: 5.4 % (ref 4.6–6.5)

## 2023-02-14 LAB — B12 AND FOLATE PANEL
Folate: >23.9 ng/mL (ref >5.9)
Vitamin B-12: >1500 pg/mL — ABNORMAL HIGH (ref 211–911)

## 2023-02-19 DIAGNOSIS — R0600 Dyspnea, unspecified: Secondary | ICD-10-CM | POA: Diagnosis not present

## 2023-03-05 ENCOUNTER — Encounter: Payer: Medicare Other | Admitting: Family

## 2023-03-05 ENCOUNTER — Telehealth: Payer: Self-pay | Admitting: Family

## 2023-03-05 NOTE — Telephone Encounter (Signed)
Contacted Daisy Holloway to schedule their annual wellness visit. Appointment made for 03/08/2023.  Thank you,  [place] Direct dial  [phone]

## 2023-03-08 ENCOUNTER — Ambulatory Visit (INDEPENDENT_AMBULATORY_CARE_PROVIDER_SITE_OTHER): Payer: Medicare Other

## 2023-03-08 VITALS — Ht 64.0 in | Wt 124.0 lb

## 2023-03-08 DIAGNOSIS — Z0000 Encounter for general adult medical examination without abnormal findings: Secondary | ICD-10-CM | POA: Diagnosis not present

## 2023-03-08 NOTE — Progress Notes (Signed)
Subjective:   Daisy Holloway is a 89 y.o. female who presents for Medicare Annual (Subsequent) preventive examination.  Review of Systems    No ROS.  Medicare Wellness Virtual Visit.  Visual/audio telehealth visit, UTA vital signs.   See social history for additional risk factors.   Cardiac Risk Factors include: advanced age (>8men, >88 women)     Objective:    Today's Vitals   03/08/23 1514  Weight: 124 lb (56.2 kg)  Height: 5\' 4"  (1.626 m)   Body mass index is 21.28 kg/m.     03/08/2023    3:20 PM 12/06/2022    6:16 PM 06/19/2022   12:01 PM 08/11/2021    1:10 PM 11/20/2017    1:46 PM 07/17/2017    9:35 AM 04/12/2017    7:22 PM  Advanced Directives  Does Patient Have a Medical Advance Directive? No No No Yes Yes Yes Yes  Type of Advance Directive    Living will [place];Living will [place];Living will [place]  Does patient want to make changes to medical advance directive?     No - Patient declined No - Patient declined   Copy of [place] in Chart?     No - copy requested No - copy requested   Would patient like information on creating a medical advance directive? No - Patient declined  No - Patient declined        Current Medications (verified) Outpatient Encounter Medications as of 03/08/2023  Medication Sig   albuterol (VENTOLIN HFA) 108 (90 Base) MCG/ACT inhaler USE 2 INHALATIONS BY MOUTH  INTO THE LUNGS 4 TIMES  DAILY AS NEEDED   aspirin EC 81 MG tablet Take 81 mg by mouth daily.   atorvastatin (LIPITOR) 10 MG tablet TAKE 1 TABLET BY MOUTH  DAILY   Cholecalciferol (VITAMIN D3) 2000 UNITS capsule Take 5,000 Units by mouth daily.    fluticasone-salmeterol (ADVAIR) 250-50 MCG/ACT AEPB INHALE 1 INHALATION BY MOUTH  INTO THE LUNGS IN THE MORNING  AND AT BEDTIME   furosemide (LASIX) 20 MG tablet Take 1 tablet (20 mg total) by mouth daily. (Patient taking differently: Take 20 mg by mouth daily. Pt  is taking 1 tablet every other day)   hydrOXYzine (VISTARIL) 25 MG capsule Take 1 capsule (25 mg total) by mouth at bedtime.   ipratropium-albuterol (DUONEB) 0.5-2.5 (3) MG/3ML SOLN Take 3 mLs by nebulization See admin instructions.   Multiple Vitamin (MULTI-VITAMINS) TABS Take by mouth. Reported on 06/13/2016   omeprazole (PRILOSEC) 20 MG capsule TAKE 1 CAPSULE BY MOUTH TWICE  DAILY   vitamin B-12 (CYANOCOBALAMIN) 1000 MCG tablet Take by mouth.   No facility-administered encounter medications on file as of 03/08/2023.    Allergies (verified) Sulfa antibiotics   History: Past Medical History:  Diagnosis Date   Anemia    IDA   Arthritis    Asthma    GERD (gastroesophageal reflux disease)    Iron deficiency anemia due to chronic blood loss 04/01/2015   Osteoporosis    Past Surgical History:  Procedure Laterality Date   ABDOMINAL HYSTERECTOMY  1964   ovaries left in   BREAST BIOPSY Right 1998   patient states calcifications-benign   CATARACT EXTRACTION     one eye in 2007 and the other eye 2012   CHOLECYSTECTOMY     KNEE SURGERY Right 2004   KNEE SURGERY Left 2012   [place]   Family  History  Problem Relation Age of Onset   Liver cancer Mother    Arthritis Mother    Gastric cancer Father    Asthma Father    Diabetes Sister    Diabetes Brother    Breast cancer Maternal Aunt    Breast cancer Cousin    Social History   Socioeconomic History   Marital status: Married    Spouse name: Not on file   Number of children: Not on file   Years of education: Not on file   Highest education level: Not on file  Occupational History   Not on file  Tobacco Use   Smoking status: Never    Passive exposure: Past   Smokeless tobacco: Never  Vaping Use   Vaping Use: Never used  Substance and Sexual Activity   Alcohol use: Yes    Comment: occ. wine   Drug use: No   Sexual activity: Not on file  Other Topics Concern   Not on file  Social History Narrative   Not  on file   Social Determinants of Health   Financial Resource Strain: Low Risk  (03/08/2023)   Overall Financial Resource Strain (CARDIA)    Difficulty of Paying Living Expenses: Not very hard  Food Insecurity: No Food Insecurity (03/08/2023)   Hunger Vital Sign    Worried About Running Out of Food in the Last Year: Never true    Ran Out of Food in the Last Year: Never true  Transportation Needs: No Transportation Needs (03/08/2023)   PRAPARE - Hydrologist (Medical): No    Lack of Transportation (Non-Medical): No  Physical Activity: Unknown (03/08/2023)   Exercise Vital Sign    Days of Exercise per Week: 0 days    Minutes of Exercise per Session: Not on file  Stress: No Stress Concern Present (03/08/2023)   [place]    Feeling of Stress : Not at all  Social Connections: Moderately Isolated (03/08/2023)   Social Connection and Isolation Panel [NHANES]    Frequency of Communication with Friends and Family: More than three times a week    Frequency of Social Gatherings with Friends and Family: More than three times a week    Attends Religious Services: 1 to 4 times per year    Active Member of [employer] or Organizations: No    Attends Archivist Meetings: Never    Marital Status: Widowed    Tobacco Counseling Counseling given: Not Answered   Clinical Intake:  Pre-visit preparation completed: Yes        Diabetes: No  How often do you need to have someone help you when you read instructions, pamphlets, or other written materials from your doctor or pharmacy?: 2 - Rarely    Interpreter Needed?: No      Activities of Daily Living    03/08/2023    3:25 PM  In your present state of health, do you have any difficulty performing the following activities:  Hearing? 1  Comment No hearing aids worn  Vision? 0  Difficulty concentrating or making decisions? 0  Walking or climbing  stairs? 1  Comment Paces self. Cane in use  Dressing or bathing? 0  Doing errands, shopping? 1  Comment [place] and eating ? Y  Comment Meals on wheels; self feeds  Using the Toilet? N  In the past six months, have you accidently leaked urine? Y  Comment Managed  with brief/pad  Do you have problems with loss of bowel control? N  Managing your Medications? N  Managing your Finances? N  Comment Daughter assist as needed  Housekeeping or managing your Housekeeping? Y  Comment Daughter assist    Patient Care Team: Burnard Hawthorne, FNP as PCP - General (Family Medicine) Kate Sable, MD as PCP - Cardiology (Cardiology)  Indicate any recent Medical Services you may have received from other than [facility] providers in the past year (date may be approximate).     Assessment:   This is a routine wellness examination for Jorie.  I connected with  Ashmita Kimbrough Bixby on 03/08/23 by a audio enabled telemedicine application and verified that I am speaking with the correct person using two identifiers.  Patient Location: Home  Provider Location: Office/Clinic  I discussed the limitations of evaluation and management by telemedicine. The patient expressed understanding and agreed to proceed.   Hearing/Vision screen Hearing Screening - Comments:: L ear hearing loss. Plans to get hearing aids. Vision Screening - Comments:: Follow[facility], Dr. Jeni Salles Wears corrective lenses when reading Cataract extraction, bilateral They have seen their ophthalmologist in the last 12 months.    Dietary issues and exercise activities discussed: Current Exercise Habits: The patient has a physically strenuous job, but has no regular exercise apart from work.   Goals Addressed               This Visit's Progress     Patient Stated     Follow up with Primary Care Provider (pt-stated)        As needed       Depression Screen    03/08/2023    3:22 PM  02/07/2023   10:36 AM 09/28/2022   12:18 PM 08/11/2021    1:06 PM 06/23/2021    2:42 PM 09/16/2020    3:07 PM  PHQ 2/9 Scores  PHQ - 2 Score 0 1 0 1 0 0  PHQ- 9 Score 1 6        Fall Risk    03/08/2023    3:22 PM 02/07/2023   10:36 AM 09/28/2022   12:19 PM 08/11/2021    1:06 PM 06/23/2021    2:40 PM  [place] in the past year? 0 0 0 0 0  Number falls in past yr: 0 0 0 0 0  Injury with Fall? 0 0 0 0 0  Risk for fall due to :  No Fall Risks  No Fall Risks No Fall Risks  Follow up Falls evaluation completed;Falls prevention discussed Falls evaluation completed   Falls evaluation completed    FALL RISK PREVENTION PERTAINING TO THE HOME: Home free of loose throw rugs in walkways, pet beds, electrical cords, etc? Yes  Adequate lighting in your home to reduce risk of falls? Yes   ASSISTIVE DEVICES UTILIZED TO PREVENT FALLS: Life alert? No  Use of a cane, walker or w/c? Yes  Grab bars in the bathroom? Yes  Shower chair or bench in shower? Yes  Elevated toilet seat or a handicapped toilet? Yes   TIMED UP AND GO: Was the test performed? No .   Cognitive Function:    08/11/2021    1:08 PM  MMSE - Mini Mental State Exam  Orientation to time 5  Orientation to Place 5  Registration 3  Attention/ Calculation 5  Recall 3  Language- name 2 objects 2  Language- repeat 1  Language- follow 3  step command 3  Language- read & follow direction 1  Write a sentence 1  Copy design 1  Total score 30        03/08/2023    3:37 PM 08/11/2021    1:09 PM  6CIT Screen  What Year? 0 points 0 points  What month? 0 points 0 points  What time? 0 points 0 points  Count back from 20 0 points 0 points  Months in reverse  0 points  Repeat phrase  0 points  Total Score  0 points    Immunizations Immunization History  Administered Date(s) Administered   Fluad Quad(high Dose 65+) 11/05/2020, 09/28/2022   Influenza, High Dose Seasonal PF 09/05/2016, 07/30/2017   Influenza-Unspecified  10/05/2015, 09/05/2016, 07/30/2017, 09/16/2020   PFIZER(Purple Top)SARS-COV-2 Vaccination 12/10/2019, 12/31/2019, 09/08/2020   Pneumococcal Conjugate-13 09/23/2015   Pneumococcal Polysaccharide-23 10/30/2017   Respiratory Syncytial Virus Vaccine,Recomb Aduvanted(Arexvy) 01/02/2023   TDAP status: Due, Education has been provided regarding the importance of this vaccine. Advised may receive this vaccine at local pharmacy or Health Dept. Aware to provide a copy of the vaccination record if obtained from local pharmacy or Health Dept. Verbalized acceptance and understanding.   Shingrix Completed?: No.    Education has been provided regarding the importance of this vaccine. Patient has been advised to call insurance company to determine out of pocket expense if they have not yet received this vaccine. Advised may also receive vaccine at local pharmacy or Health Dept. Verbalized acceptance and understanding.  Screening Tests Health Maintenance  Topic Date Due   DTaP/Tdap/Td (1 - Tdap) Never done   COVID-19 Vaccine (4 - 2023-24 season) 03/24/2023 (Originally 08/04/2022)   Zoster Vaccines- Shingrix (1 of 2) 06/07/2023 (Originally 05/02/1949)   DEXA SCAN  08/05/2023 (Originally 05/01/1997)   Medicare Annual Wellness (AWV)  03/07/2024   Pneumonia Vaccine 97+ Years old  Completed   HPV VACCINES  Aged Out    Health Maintenance Health Maintenance Due  Topic Date Due   DTaP/Tdap/Td (1 - Tdap) Never done   Bone density- declined.   Lung Cancer Screening: (Low Dose CT Chest recommended if Age 25-80 years, 30 pack-year currently smoking OR have quit w/in 15years.) does not qualify.   Hepatitis C Screening: does not qualify.  Vision Screening: Recommended annual ophthalmology exams for early detection of glaucoma and other disorders of the eye.  Dental Screening: Recommended annual dental exams for proper oral hygiene  Community Resource Referral / Chronic Care Management: CRR required this visit?   No   CCM required this visit?  No      Plan:     I have personally reviewed and noted the following in the patient's chart:   Medical and social history Use of alcohol, tobacco or illicit drugs  Current medications and supplements including opioid prescriptions. Patient is not currently taking opioid prescriptions. Functional ability and status Nutritional status Physical activity Advanced directives List of other physicians Hospitalizations, surgeries, and ER visits in previous 12 months Vitals Screenings to include cognitive, depression, and falls Referrals and appointments  In addition, I have reviewed and discussed with patient certain preventive protocols, quality metrics, and best practice recommendations. A written personalized care plan for preventive services as well as general preventive health recommendations were provided to patient.     Leta Jungling, LPN   [id number]

## 2023-03-08 NOTE — Patient Instructions (Addendum)
Ms. Daisy Holloway , Thank you for taking time to come for your Medicare Wellness Visit. I appreciate your ongoing commitment to your health goals. Please review the following plan we discussed and let me know if I can assist you in the future.   These are the goals we discussed:  Goals       Patient Stated     Follow up with Primary Care Provider (pt-stated)      As needed        This is a list of the screening recommended for you and due dates:  Health Maintenance  Topic Date Due   DTaP/Tdap/Td vaccine (1 - Tdap) Never done   COVID-19 Vaccine (4 - 2023-24 season) 03/24/2023*   Zoster (Shingles) Vaccine (1 of 2) 06/07/2023*   DEXA scan (bone density measurement)  08/05/2023*   Medicare Annual Wellness Visit  03/07/2024   Pneumonia Vaccine  Completed   HPV Vaccine  Aged Out  *Topic was postponed. The date shown is not the original due date.   Conditions/risks identified: none new  Next appointment: Follow up in one year for your annual wellness visit    Preventive Care 65 Years and Older, Female Preventive care refers to lifestyle choices and visits with your health care provider that can promote health and wellness. What does preventive care include? A yearly physical exam. This is also called an annual well check. Dental exams once or twice a year. Routine eye exams. Ask your health care provider how often you should have your eyes checked. Personal lifestyle choices, including: Daily care of your teeth and gums. Regular physical activity. Eating a healthy diet. Avoiding tobacco and drug use. Limiting alcohol use. Practicing safe sex. Taking low-dose aspirin every day. Taking vitamin and mineral supplements as recommended by your health care provider. What happens during an annual well check? The services and screenings done by your health care provider during your annual well check will depend on your age, overall health, lifestyle risk factors, and family history of  disease. Counseling  Your health care provider may ask you questions about your: Alcohol use. Tobacco use. Drug use. Emotional well-being. Home and relationship well-being. Sexual activity. Eating habits. History of falls. Memory and ability to understand (cognition). Work and work Statistician. Reproductive health. Screening  You may have the following tests or measurements: Height, weight, and BMI. Blood pressure. Lipid and cholesterol levels. These may be checked every 5 years, or more frequently if you are over 64 years old. Skin check. Lung cancer screening. You may have this screening every year starting at age 67 if you have a 30-pack-year history of smoking and currently smoke or have quit within the past 15 years. Fecal occult blood test (FOBT) of the stool. You may have this test every year starting at age 44. Flexible sigmoidoscopy or colonoscopy. You may have a sigmoidoscopy every 5 years or a colonoscopy every 10 years starting at age 80. Hepatitis C blood test. Hepatitis B blood test. Sexually transmitted disease (STD) testing. Diabetes screening. This is done by checking your blood sugar (glucose) after you have not eaten for a while (fasting). You may have this done every 1-3 years. Bone density scan. This is done to screen for osteoporosis. You may have this done starting at age 28. Mammogram. This may be done every 1-2 years. Talk to your health care provider about how often you should have regular mammograms. Talk with your health care provider about your test results, treatment options, and if  necessary, the need for more tests. Vaccines  Your health care provider may recommend certain vaccines, such as: Influenza vaccine. This is recommended every year. Tetanus, diphtheria, and acellular pertussis (Tdap, Td) vaccine. You may need a Td booster every 10 years. Zoster vaccine. You may need this after age 58. Pneumococcal 13-valent conjugate (PCV13) vaccine. One  dose is recommended after age 24. Pneumococcal polysaccharide (PPSV23) vaccine. One dose is recommended after age 11. Talk to your health care provider about which screenings and vaccines you need and how often you need them. This information is not intended to replace advice given to you by your health care provider. Make sure you discuss any questions you have with your health care provider. Document Released: 12/17/2015 Document Revised: 08/09/2016 Document Reviewed: 09/21/2015 Elsevier Interactive Patient Education  2017 [place] Prevention in the Home Falls can cause injuries. They can happen to people of all ages. There are many things you can do to make your home safe and to help prevent falls. What can I do on the outside of my home? Regularly fix the edges of walkways and driveways and fix any cracks. Remove anything that might make you trip as you walk through a door, such as a raised step or threshold. Trim any bushes or trees on the path to your home. Use bright outdoor lighting. Clear any walking paths of anything that might make someone trip, such as rocks or tools. Regularly check to see if handrails are loose or broken. Make sure that both sides of any steps have handrails. Any raised decks and porches should have guardrails on the edges. Have any leaves, snow, or ice cleared regularly. Use sand or salt on walking paths during winter. Clean up any spills in your garage right away. This includes oil or grease spills. What can I do in the bathroom? Use night lights. Install grab bars by the toilet and in the tub and shower. Do not use towel bars as grab bars. Use non-skid mats or decals in the tub or shower. If you need to sit down in the shower, use a plastic, non-slip stool. Keep the floor dry. Clean up any water that spills on the floor as soon as it happens. Remove soap buildup in the tub or shower regularly. Attach bath mats securely with double-sided  non-slip rug tape. Do not have throw rugs and other things on the floor that can make you trip. What can I do in the bedroom? Use night lights. Make sure that you have a light by your bed that is easy to reach. Do not use any sheets or blankets that are too big for your bed. They should not hang down onto the floor. Have a firm chair that has side arms. You can use this for support while you get dressed. Do not have throw rugs and other things on the floor that can make you trip. What can I do in the kitchen? Clean up any spills right away. Avoid walking on wet floors. Keep items that you use a lot in easy-to-reach places. If you need to reach something above you, use a strong step stool that has a grab bar. Keep electrical cords out of the way. Do not use floor polish or wax that makes floors slippery. If you must use wax, use non-skid floor wax. Do not have throw rugs and other things on the floor that can make you trip. What can I do with my stairs? Do not leave any items  on the stairs. Make sure that there are handrails on both sides of the stairs and use them. Fix handrails that are broken or loose. Make sure that handrails are as long as the stairways. Check any carpeting to make sure that it is firmly attached to the stairs. Fix any carpet that is loose or worn. Avoid having throw rugs at the top or bottom of the stairs. If you do have throw rugs, attach them to the floor with carpet tape. Make sure that you have a light switch at the top of the stairs and the bottom of the stairs. If you do not have them, ask someone to add them for you. What else can I do to help prevent falls? Wear shoes that: Do not have high heels. Have rubber bottoms. Are comfortable and fit you well. Are closed at the toe. Do not wear sandals. If you use a stepladder: Make sure that it is fully opened. Do not climb a closed stepladder. Make sure that both sides of the stepladder are locked into place. Ask  someone to hold it for you, if possible. Clearly mark and make sure that you can see: Any grab bars or handrails. First and last steps. Where the edge of each step is. Use tools that help you move around (mobility aids) if they are needed. These include: Canes. Walkers. Scooters. Crutches. Turn on the lights when you go into a dark area. Replace any light bulbs as soon as they burn out. Set up your furniture so you have a clear path. Avoid moving your furniture around. If any of your floors are uneven, fix them. If there are any pets around you, be aware of where they are. Review your medicines with your doctor. Some medicines can make you feel dizzy. This can increase your chance of falling. Ask your doctor what other things that you can do to help prevent falls. This information is not intended to replace advice given to you by your health care provider. Make sure you discuss any questions you have with your health care provider. Document Released: 09/16/2009 Document Revised: 04/27/2016 Document Reviewed: 12/25/2014 Elsevier Interactive Patient Education  2017 [employer].

## 2023-03-20 ENCOUNTER — Ambulatory Visit: Payer: Medicare Other | Attending: Cardiology

## 2023-03-20 DIAGNOSIS — R42 Dizziness and giddiness: Secondary | ICD-10-CM | POA: Diagnosis not present

## 2023-03-20 DIAGNOSIS — I350 Nonrheumatic aortic (valve) stenosis: Secondary | ICD-10-CM

## 2023-03-20 DIAGNOSIS — R0602 Shortness of breath: Secondary | ICD-10-CM

## 2023-03-21 ENCOUNTER — Encounter: Payer: Self-pay | Admitting: Family

## 2023-03-21 ENCOUNTER — Ambulatory Visit (INDEPENDENT_AMBULATORY_CARE_PROVIDER_SITE_OTHER): Payer: Medicare Other | Admitting: Family

## 2023-03-21 VITALS — BP 118/72 | HR 71 | Temp 98.4°F | Ht 62.0 in | Wt 123.8 lb

## 2023-03-21 DIAGNOSIS — K219 Gastro-esophageal reflux disease without esophagitis: Secondary | ICD-10-CM | POA: Diagnosis not present

## 2023-03-21 DIAGNOSIS — R0602 Shortness of breath: Secondary | ICD-10-CM | POA: Diagnosis not present

## 2023-03-21 DIAGNOSIS — F419 Anxiety disorder, unspecified: Secondary | ICD-10-CM

## 2023-03-21 LAB — ECHOCARDIOGRAM COMPLETE
AR max vel: 1.39 cm2
AV Area VTI: 1.44 cm2
AV Area mean vel: 1.32 cm2
AV Mean grad: 16 mmHg
AV Peak grad: 27.8 mmHg
AV Vena cont: 0.35 cm
Ao pk vel: 2.64 m/s
Area-P 1/2: 4.31 cm2
P 1/2 time: 459 msec
S' Lateral: 2.8 cm

## 2023-03-21 MED ORDER — HYDROXYZINE PAMOATE 25 MG PO CAPS: 25.0000 mg | ORAL_CAPSULE | Freq: Every day | ORAL | 1 refills | Status: DC

## 2023-03-21 MED ORDER — PANTOPRAZOLE SODIUM 20 MG PO TBEC: 40.0000 mg | DELAYED_RELEASE_TABLET | Freq: Every day | ORAL | 2 refills | Status: DC

## 2023-03-21 NOTE — Patient Instructions (Addendum)
Please ensure to continue follow up with Dr Aundria Rud   As discussed, you may stop omeprazole (Prilosec) for now.  I am going to try Protonix which is typically more effective.  We will start with Protonix 40 mg taken 30 minutes to 1 hour prior to breakfast.  This is considered a higher dose.  After 2 weeks on the higher dose, please decrease to Protonix 20 mg taken 30 minutes to 1 hour prior to breakfast.  Please let me know how you are doing. Please consider starting over-the-counter Zyrtec or Claritin which is an antihistamine.   Hydroxyzine (Atarax) is also an antihistamine however you are using that for anxiety as well as insomnia.  You may take this at bedtime.  I have sent in a refill for you today.

## 2023-03-21 NOTE — Progress Notes (Signed)
Assessment & Plan:  Gastroesophageal reflux disease without esophagitis Assessment & Plan: History of esophageal stricture.  Some trouble swallowing bread at this time.  Advise GI consult for consideration of EGD.  Patient politely declines.  Will monitor.  Stop omeprazole.  Start Protonix 40 mg for 2 weeks and then decrease to 20 mg.  Consider addition of Pepcid AC.  Close follow up.   Orders: -     Pantoprazole Sodium; Take 2 tablets (40 mg total) by mouth daily. Decrease to 20mg  qd after 2 weeks  Dispense: 90 tablet; Refill: 2  SOB (shortness of breath) -     CBC with Differential/Platelet -     Iron, TIBC and Ferritin Panel  Anxiety -     hydrOXYzine Pamoate; Take 1 capsule (25 mg total) by mouth at bedtime. anxiety  Dispense: 90 capsule; Refill: 1     Return precautions given.   Risks, benefits, and alternatives of the medications and treatment plan prescribed today were discussed, and patient expressed understanding.   Education regarding symptom management and diagnosis given to patient on AVS either electronically or printed.  Return in about 6 weeks (around 05/02/2023).  Rennie Plowman, FNP  Subjective:    Patient ID: Daisy Holloway, female    DOB: 02/14/1930, 89 y.o.   MRN: [id number]  CC: Daisy Holloway is a 89 y.o. female who presents today for follow up.   HPI: Accompanied by daughter  Previously seen by Dr Mechele Collin for adomdinal pain, hiatal hernia, gerd.  Daisy Holloway take 1-2 omeprazole qhs with some relief. Had been on carafate per Dr Mechele Collin  Occasional pain with swallowing with bread.   No fever, blood in stool, constipation,  vomiting, wieght loss.   No nsaids, alcohol, known h/o PUD.  Daisy Holloway has epigastric pain after eating blooming onion at [facility].     Wears o2 at night  Taking atarax 25mg  qpm for sleep and anxiety  It is not too sedating      Compliant with advair, albuterol 3-4 times per day. Daisy Holloway has follow up scheduled with Dr Elgie Collard.    History of hysterectomy, cholecystectomy   Previously had been pt of Dr [facility]   Large hiatal hernia seen CT abdomen pelvis 08/16/2015. Daisy Holloway had a upper endoscopy in October, 2015 that showed normal esophagus. Gastritis. Normal examined duodenum.  Daisy Holloway last colonoscopy was in October, 2013. Indications; iron deficiency anemia. Findings; diverticulosis in the sigmoid colon.   Dr Markham Jordan 08/30/2015 ; Daisy Holloway was continued Protonix along with Carafate   Daisy Holloway had seen Dr [name] Ide 01/15/2015 Mild-moderate obstruction on spirometry. Suspect copd. Today holding Daisy Holloway own GERD MVP  Plan: -Stay on advair 250/50 one puff q 12 hours -albuterol prn -If symptoms continue to relapse consider adding spiriva -watch weight -annual flu shots -consider prevnar 13 -follow up in 4 months -return sooner if symptoms persist   Allergies: Sulfa antibiotics Current Outpatient Medications on File Prior to Visit  Medication Sig Dispense Refill   aspirin EC 81 MG tablet Take 81 mg by mouth daily.     atorvastatin (LIPITOR) 10 MG tablet TAKE 1 TABLET BY MOUTH  DAILY 90 tablet 3   Cholecalciferol (VITAMIN D3) 2000 UNITS capsule Take 5,000 Units by mouth daily.      fluticasone-salmeterol (ADVAIR) 250-50 MCG/ACT AEPB INHALE 1 INHALATION BY MOUTH  INTO THE LUNGS IN THE MORNING  AND AT BEDTIME 200 each 1   furosemide (LASIX) 20 MG tablet Take 1 tablet (20 mg total) by mouth daily.  90 tablet 3   ipratropium-albuterol (DUONEB) 0.5-2.5 (3) MG/3ML SOLN Take 3 mLs by nebulization See admin instructions. 360 mL 8   Multiple Vitamin (MULTI-VITAMINS) TABS Take by mouth. Reported on 06/13/2016     vitamin B-12 (CYANOCOBALAMIN) 1000 MCG tablet Take by mouth. (Patient not taking: Reported on 03/26/2023)     No current facility-administered medications on file prior to visit.    Review of Systems    Objective:    BP 118/72   Pulse 71   Temp 98.4 F (36.9 C) (Oral)   Ht 5\' 2"  (1.575 m)   Wt 123 lb 12.8 oz (56.2 kg)    SpO2 92%   BMI 22.64 kg/m  BP Readings from Last 3 Encounters:  03/26/23 130/72  03/21/23 118/72  02/07/23 118/70   Wt Readings from Last 3 Encounters:  03/26/23 124 lb 9.6 oz (56.5 kg)  03/21/23 123 lb 12.8 oz (56.2 kg)  03/08/23 124 lb (56.2 kg)    Physical Exam Vitals reviewed.  Constitutional:      Appearance: Normal appearance. Daisy Holloway is well-developed.  Eyes:     Conjunctiva/sclera: Conjunctivae normal.  Cardiovascular:     Rate and Rhythm: Normal rate and regular rhythm.     Pulses: Normal pulses.     Heart sounds: Normal heart sounds.  Pulmonary:     Effort: Pulmonary effort is normal.     Breath sounds: Normal breath sounds. No wheezing, rhonchi or rales.  Abdominal:     General: Bowel sounds are normal. There is no distension.     Palpations: Abdomen is soft. Abdomen is not rigid. There is no fluid wave or mass.     Tenderness: There is no abdominal tenderness. There is no guarding or rebound.  Musculoskeletal:     Right lower leg: No edema.     Left lower leg: No edema.  Skin:    General: Skin is warm and dry.  Neurological:     Mental Status: Daisy Holloway is alert.  Psychiatric:        Speech: Speech normal.        Behavior: Behavior normal.        Thought Content: Thought content normal.

## 2023-03-21 NOTE — Assessment & Plan Note (Addendum)
History of esophageal stricture.  Some trouble swallowing bread at this time.  Advise GI consult for consideration of EGD.  Patient politely declines.  Will monitor.  Stop omeprazole.  Start Protonix 40 mg for 2 weeks and then decrease to 20 mg.  Consider addition of Pepcid AC.  Close follow up.

## 2023-03-22 DIAGNOSIS — R0600 Dyspnea, unspecified: Secondary | ICD-10-CM | POA: Diagnosis not present

## 2023-03-22 LAB — CBC WITH DIFFERENTIAL/PLATELET
Basophils Absolute: 0 10*3/uL (ref 0.0–0.1)
Basophils Relative: 1.2 % (ref 0.0–3.0)
Eosinophils Absolute: 0.1 10*3/uL (ref 0.0–0.7)
Eosinophils Relative: 3.8 % (ref 0.0–5.0)
HCT: 40.8 % (ref 36.0–46.0)
Hemoglobin: 13.5 g/dL (ref 12.0–15.0)
Lymphocytes Relative: 32.1 % (ref 12.0–46.0)
Lymphs Abs: 1.2 10*3/uL (ref 0.7–4.0)
MCHC: 33.1 g/dL (ref 30.0–36.0)
MCV: 84.2 fl (ref 78.0–100.0)
Monocytes Absolute: 0.5 10*3/uL (ref 0.1–1.0)
Monocytes Relative: 13.5 % — ABNORMAL HIGH (ref 3.0–12.0)
Neutro Abs: 1.8 10*3/uL (ref 1.4–7.7)
Neutrophils Relative %: 49.4 % (ref 43.0–77.0)
Platelets: 136 10*3/uL — ABNORMAL LOW (ref 150.0–400.0)
RBC: 4.84 Mil/uL (ref 3.87–5.11)
RDW: 14.4 % (ref 11.5–15.5)
WBC: 3.7 10*3/uL — ABNORMAL LOW (ref 4.0–10.5)

## 2023-03-22 LAB — IRON,TIBC AND FERRITIN PANEL
%SAT: 13 % (calc) — ABNORMAL LOW (ref 16–45)
Ferritin: 50 ng/mL (ref 16–288)
Iron: 43 ug/dL — ABNORMAL LOW (ref 45–160)
TIBC: 325 mcg/dL (calc) (ref 250–450)

## 2023-03-26 ENCOUNTER — Encounter: Payer: Self-pay | Admitting: Cardiology

## 2023-03-26 ENCOUNTER — Telehealth: Payer: Self-pay | Admitting: Family

## 2023-03-26 ENCOUNTER — Ambulatory Visit: Payer: Medicare Other | Attending: Cardiology | Admitting: Cardiology

## 2023-03-26 VITALS — BP 130/72 | HR 72 | Ht 64.0 in | Wt 124.6 lb

## 2023-03-26 DIAGNOSIS — I350 Nonrheumatic aortic (valve) stenosis: Secondary | ICD-10-CM

## 2023-03-26 DIAGNOSIS — R0600 Dyspnea, unspecified: Secondary | ICD-10-CM | POA: Diagnosis not present

## 2023-03-26 DIAGNOSIS — R42 Dizziness and giddiness: Secondary | ICD-10-CM | POA: Diagnosis not present

## 2023-03-26 DIAGNOSIS — I2089 Other forms of angina pectoris: Secondary | ICD-10-CM | POA: Diagnosis not present

## 2023-03-26 MED ORDER — ALBUTEROL SULFATE HFA 108 (90 BASE) MCG/ACT IN AERS: INHALATION_SPRAY | RESPIRATORY_TRACT | 3 refills | Status: DC

## 2023-03-26 NOTE — Patient Instructions (Signed)
Medication Instructions:   Your physician recommends that you continue on your current medications as directed. Please refer to the Current Medication list given to you today.  *If you need a refill on your cardiac medications before your next appointment, please call your pharmacy*   Lab Work:  None Ordered  If you have labs (blood work) drawn today and your tests are completely normal, you will receive your results only by: MyChart Message (if you have MyChart) OR A paper copy in the mail If you have any lab test that is abnormal or we need to change your treatment, we will call you to review the results.   Testing/Procedures:  [facility] MYOVIEW  Your caregiver has ordered a Stress Test with nuclear imaging. The purpose of this test is to evaluate the blood supply to your heart muscle. This procedure is referred to as a "Non-Invasive Stress Test." This is because other than having an IV started in your vein, nothing is inserted or "invades" your body. Cardiac stress tests are done to find areas of poor blood flow to the heart by determining the extent of coronary artery disease (CAD). Some patients exercise on a treadmill, which naturally increases the blood flow to your heart, while others who are  unable to walk on a treadmill due to physical limitations have a pharmacologic/chemical stress agent called Lexiscan . This medicine will mimic walking on a treadmill by temporarily increasing your coronary blood flow.   Please note: these test may take anywhere between 2-4 hours to complete  PLEASE REPORT TO [facility] MEDICAL MALL ENTRANCE  THE VOLUNTEERS AT THE FIRST DESK WILL DIRECT YOU WHERE TO GO  Date of Procedure:_____________________________________  Arrival Time for Procedure:______________________________   PLEASE NOTIFY THE OFFICE AT LEAST 24 HOURS IN ADVANCE IF YOU ARE UNABLE TO KEEP YOUR APPOINTMENT.  [phone] AND  PLEASE NOTIFY NUCLEAR MEDICINE AT [facility] AT LEAST 24 HOURS IN  ADVANCE IF YOU ARE UNABLE TO KEEP YOUR APPOINTMENT. [phone]  How to prepare for your Myoview test:  Do not eat or drink after midnight No caffeine for 24 hours prior to test No smoking 24 hours prior to test. Your medication may be taken with water.  If your doctor stopped a medication because of this test, do not take that medication. Ladies, please do not wear dresses.  Skirts or pants are appropriate. Please wear a short sleeve shirt. No perfume, cologne or lotion. Wear comfortable walking shoes. No heels!   Follow-Up: At [facility], you and your health needs are our priority.  As part of our continuing mission to provide you with exceptional heart care, we have created designated Provider Care Teams.  These Care Teams include your primary Cardiologist (physician) and Advanced Practice Providers (APPs -  Physician Assistants and Nurse Practitioners) who all work together to provide you with the care you need, when you need it.  We recommend signing up for the patient portal called "MyChart".  Sign up information is provided on this After Visit Summary.  MyChart is used to connect with patients for Virtual Visits (Telemedicine).  Patients are able to view lab/test results, encounter notes, upcoming appointments, etc.  Non-urgent messages can be sent to your provider as well.   To learn more about what you can do with MyChart, go to [email or web address].    Your next appointment:   3 month(s)  Provider:   You may see Debbe Odea, MD or one of the following Advanced Practice Providers on your designated Care  Team:   Murray Hodgkins, NP Christell Faith, PA-C Cadence Kathlen Mody, PA-C Gerrie Nordmann, NP

## 2023-03-26 NOTE — Telephone Encounter (Signed)
Rx refilled pt is aware

## 2023-03-26 NOTE — Telephone Encounter (Signed)
Prescription Request  03/26/2023  LOV: 03/21/2023  What is the name of the medication or equipment? albuterol (VENTOLIN HFA) 108 (90 Base) MCG/ACT inhaler  Have you contacted your pharmacy to request a refill? Yes   Which pharmacy would you like this sent to?    [facility] [place], [place] - [address] [facility] [address] [place] [address] [place] [address] Phone: [phone] Fax: [phone]      Patient notified that their request is being sent to the clinical staff for review and that they should receive a response within 2 business days.   Please advise at Mobile [phone] (mobile)

## 2023-03-26 NOTE — Progress Notes (Signed)
Cardiology Office Note:    Date:  03/26/2023   ID:  KINZE LABO, DOB 10/08/1930, MRN [id number]  PCP:  Allegra Grana, FNP   [facility] Providers Cardiologist:  Debbe Odea, MD     Referring MD: Corky Downs, MD   Chief Complaint  Patient presents with   Follow-up    Discuss test results.  Still having increased SOBr.  Not able to sleep on left side due causing chest pain.  Patient reports feeling better when taking Diltiazem.  Also reports feeling occasional lightheaded first thing in the morning and no episodes in afternoon.    History of Present Illness:    Daisy Holloway is a 89 y.o. female with a hx of GERD, mild to moderate aortic stenosis who presents for follow-up, with shortness of breath.    Previously seen with symptoms of shortness of breath.  Echocardiogram was obtained to evaluate worsening valvular disease or any new structural abnormalities.  Denies smoking but she worked in the [employer] for over 25 years.  Plans to undergo pulmonary function testing soon.  Diagnosed with a history of angina, previously on Cardizem.  She complains of dizziness, Cardizem was stopped at her last visit.  Blood pressures at home range from 90s to 130s systolic.  States having heart rate flaring.  Prior notes Echo 7/23 from [facility] EF over 55%, impaired relaxation, moderate aortic stenosis.  Past Medical History:  Diagnosis Date   Anemia    IDA   Arthritis    Asthma    GERD (gastroesophageal reflux disease)    Iron deficiency anemia due to chronic blood loss 04/01/2015   Osteoporosis     Past Surgical History:  Procedure Laterality Date   ABDOMINAL HYSTERECTOMY  1964   ovaries left in   BREAST BIOPSY Right 1998   patient states calcifications-benign   CATARACT EXTRACTION     one eye in 2007 and the other eye 2012   CHOLECYSTECTOMY     KNEE SURGERY Right 2004   KNEE SURGERY Left 2012   SHOULDER SURGERY  1995    Current Medications: Current Meds   Medication Sig   albuterol (VENTOLIN HFA) 108 (90 Base) MCG/ACT inhaler USE 2 INHALATIONS BY MOUTH  INTO THE LUNGS 4 TIMES  DAILY AS NEEDED   aspirin EC 81 MG tablet Take 81 mg by mouth daily.   atorvastatin (LIPITOR) 10 MG tablet TAKE 1 TABLET BY MOUTH  DAILY   Cholecalciferol (VITAMIN D3) 2000 UNITS capsule Take 5,000 Units by mouth daily.    fluticasone-salmeterol (ADVAIR) 250-50 MCG/ACT AEPB INHALE 1 INHALATION BY MOUTH  INTO THE LUNGS IN THE MORNING  AND AT BEDTIME   furosemide (LASIX) 20 MG tablet Take 1 tablet (20 mg total) by mouth daily.   hydrOXYzine (VISTARIL) 25 MG capsule Take 1 capsule (25 mg total) by mouth at bedtime. anxiety   ipratropium-albuterol (DUONEB) 0.5-2.5 (3) MG/3ML SOLN Take 3 mLs by nebulization See admin instructions.   Multiple Vitamin (MULTI-VITAMINS) TABS Take by mouth. Reported on 06/13/2016   pantoprazole (PROTONIX) 20 MG tablet Take 2 tablets (40 mg total) by mouth daily. Decrease to 20mg  qd after 2 weeks     Allergies:   Sulfa antibiotics   Social History   Socioeconomic History   Marital status: Married    Spouse name: Not on file   Number of children: Not on file   Years of education: Not on file   Highest education level: Not on file  Occupational  History   Not on file  Tobacco Use   Smoking status: Never    Passive exposure: Past   Smokeless tobacco: Never  Vaping Use   Vaping Use: Never used  Substance and Sexual Activity   Alcohol use: Yes    Comment: occ. wine   Drug use: No   Sexual activity: Not on file  Other Topics Concern   Not on file  Social History Narrative   Not on file   Social Determinants of Health   Financial Resource Strain: Low Risk  (03/08/2023)   Overall Financial Resource Strain (CARDIA)    Difficulty of Paying Living Expenses: Not very hard  Food Insecurity: No Food Insecurity (03/08/2023)   Hunger Vital Sign    Worried About Running Out of Food in the Last Year: Never true    Ran Out of Food in the Last  Year: Never true  Transportation Needs: No Transportation Needs (03/08/2023)   PRAPARE - Administrator, Civil Service (Medical): No    Lack of Transportation (Non-Medical): No  Physical Activity: Unknown (03/08/2023)   Exercise Vital Sign    Days of Exercise per Week: 0 days    Minutes of Exercise per Session: Not on file  Stress: No Stress Concern Present (03/08/2023)   [employer] of Occupational Health - Occupational Stress Questionnaire    Feeling of Stress : Not at all  Social Connections: Moderately Isolated (03/08/2023)   Social Connection and Isolation Panel [NHANES]    Frequency of Communication with Friends and Family: More than three times a week    Frequency of Social Gatherings with Friends and Family: More than three times a week    Attends Religious Services: 1 to 4 times per year    Active Member of [employer] or Organizations: No    Attends Banker Meetings: Never    Marital Status: Widowed     Family History: The patient's family history includes Arthritis in her mother; Asthma in her father; Breast cancer in her cousin and maternal aunt; Diabetes in her brother and sister; Gastric cancer in her father; Liver cancer in her mother.  ROS:   Please see the history of present illness.     All other systems reviewed and are negative.  EKGs/Labs/Other Studies Reviewed:    The following studies were reviewed today:   EKG:  EKG not ordered today.    Recent Labs: 12/13/2022: B Natriuretic Peptide 215.1 02/14/2023: ALT 13; BUN 14; Creatinine, Ser 0.70; Potassium 4.2; Sodium 143 03/21/2023: Hemoglobin 13.5; Platelets 136.0  Recent Lipid Panel    Component Value Date/Time   CHOL 171 02/14/2023 1040   TRIG 69.0 02/14/2023 1040   HDL 90.80 02/14/2023 1040   CHOLHDL 2 02/14/2023 1040   VLDL 13.8 02/14/2023 1040   LDLCALC 66 02/14/2023 1040     Risk Assessment/Calculations:             Physical Exam:    VS:  BP 130/72 (BP Location: Left Arm,  Patient Position: Sitting, Cuff Size: Normal)   Pulse 72   Ht 5\' 4"  (1.626 m)   Wt 124 lb 9.6 oz (56.5 kg)   SpO2 96%   BMI 21.39 kg/m     Wt Readings from Last 3 Encounters:  03/26/23 124 lb 9.6 oz (56.5 kg)  03/21/23 123 lb 12.8 oz (56.2 kg)  03/08/23 124 lb (56.2 kg)     GEN:  Well nourished, well developed in no acute distress HEENT: Normal NECK:  No JVD; No carotid bruits CARDIAC: RRR, 3/6 systolic murmur RESPIRATORY: Diminished breath sound bilaterally, no wheezing. ABDOMEN: Soft, non-tender, non-distended MUSCULOSKELETAL:  No edema; No deformity  SKIN: Warm and dry NEUROLOGIC:  Alert and oriented x 3 PSYCHIATRIC:  Normal affect   ASSESSMENT:    1. Dyspnea, unspecified type   2. Aortic valve stenosis, etiology of cardiac valve disease unspecified   3. Dizziness   4. Anginal equivalent     PLAN:    In order of problems listed above:  Shortness of breath, never smoker.  Echo with normal systolic function, EF 50 to 55%, impaired relaxation.  Self-reported history of angina.  Get [employer]. Mild to moderate aortic stenosis.  EF 50 to 55%.  Unchanged from prior in 2023.  Continue to monitor with serial echocardiograms. Dizziness, hard of hearing, may have vertigo.  Consider ENT workup if deemed appropriate as per PCP.  Follow-up in 3 months or earlier if significant findings noted on Myoview.  Shared Decision Making/Informed Consent The risks [chest pain, shortness of breath, cardiac arrhythmias, dizziness, blood pressure fluctuations, myocardial infarction, stroke/transient ischemic attack, nausea, vomiting, allergic reaction, radiation exposure, metallic taste sensation and life-threatening complications (estimated to be 1 in 10,000)], benefits (risk stratification, diagnosing coronary artery disease, treatment guidance) and alternatives of a nuclear stress test were discussed in detail with Ms. Moline and she agrees to proceed.      Medication Adjustments/Labs  and Tests Ordered: Current medicines are reviewed at length with the patient today.  Concerns regarding medicines are outlined above.  Orders Placed This Encounter  Procedures   NM Myocar Multi W/Spect W/Wall Motion / EF   No orders of the defined types were placed in this encounter.   Patient Instructions  Medication Instructions:   Your physician recommends that you continue on your current medications as directed. Please refer to the Current Medication list given to you today.  *If you need a refill on your cardiac medications before your next appointment, please call your pharmacy*   Lab Work:  None Ordered  If you have labs (blood work) drawn today and your tests are completely normal, you will receive your results only by: MyChart Message (if you have MyChart) OR A paper copy in the mail If you have any lab test that is abnormal or we need to change your treatment, we will call you to review the results.   Testing/Procedures:  [facility] MYOVIEW  Your caregiver has ordered a Stress Test with nuclear imaging. The purpose of this test is to evaluate the blood supply to your heart muscle. This procedure is referred to as a "Non-Invasive Stress Test." This is because other than having an IV started in your vein, nothing is inserted or "invades" your body. Cardiac stress tests are done to find areas of poor blood flow to the heart by determining the extent of coronary artery disease (CAD). Some patients exercise on a treadmill, which naturally increases the blood flow to your heart, while others who are  unable to walk on a treadmill due to physical limitations have a pharmacologic/chemical stress agent called Lexiscan . This medicine will mimic walking on a treadmill by temporarily increasing your coronary blood flow.   Please note: these test may take anywhere between 2-4 hours to complete  PLEASE REPORT TO [facility] MEDICAL MALL ENTRANCE  THE VOLUNTEERS AT THE FIRST DESK WILL DIRECT YOU  WHERE TO GO  Date of Procedure:_____________________________________  Arrival Time for Procedure:______________________________   PLEASE NOTIFY THE OFFICE  AT LEAST 24 HOURS IN ADVANCE IF YOU ARE UNABLE TO KEEP YOUR APPOINTMENT.  [phone] AND  PLEASE NOTIFY NUCLEAR MEDICINE AT [facility] AT LEAST 24 HOURS IN ADVANCE IF YOU ARE UNABLE TO KEEP YOUR APPOINTMENT. [phone]  How to prepare for your Myoview test:  Do not eat or drink after midnight No caffeine for 24 hours prior to test No smoking 24 hours prior to test. Your medication may be taken with water.  If your doctor stopped a medication because of this test, do not take that medication. Ladies, please do not wear dresses.  Skirts or pants are appropriate. Please wear a short sleeve shirt. No perfume, cologne or lotion. Wear comfortable walking shoes. No heels!   Follow-Up: At [facility], you and your health needs are our priority.  As part of our continuing mission to provide you with exceptional heart care, we have created designated Provider Care Teams.  These Care Teams include your primary Cardiologist (physician) and Advanced Practice Providers (APPs -  Physician Assistants and Nurse Practitioners) who all work together to provide you with the care you need, when you need it.  We recommend signing up for the patient portal called "MyChart".  Sign up information is provided on this After Visit Summary.  MyChart is used to connect with patients for Virtual Visits (Telemedicine).  Patients are able to view lab/test results, encounter notes, upcoming appointments, etc.  Non-urgent messages can be sent to your provider as well.   To learn more about what you can do with MyChart, go to [email or web address].    Your next appointment:   3 month(s)  Provider:   You may see Debbe Odea, MD or one of the following Advanced Practice Providers on your designated Care Team:   Nicolasa Ducking, NP Eula Listen,  PA-C Cadence Fransico Michael, PA-C Charlsie Quest, NP    Signed, Debbe Odea, MD  03/26/2023 3:26 PM    [facility]

## 2023-03-28 ENCOUNTER — Telehealth: Payer: Self-pay | Admitting: Family

## 2023-03-28 NOTE — Telephone Encounter (Signed)
[facility] Rec'd fax that pt DOB is [birth year]  That is not what we have a chart  Please give verbal to change

## 2023-04-02 ENCOUNTER — Other Ambulatory Visit: Payer: Medicare Other

## 2023-04-02 NOTE — Telephone Encounter (Signed)
Spoke to pt in regards to her bday being wrong and she stated to leave it as it is due to her insurance because it has been this way for so long that she does not feel the need to change it now

## 2023-04-09 NOTE — Telephone Encounter (Signed)
Received fax from [facility] and had provider sign with correct info faxed back on 04/09/23

## 2023-04-21 DIAGNOSIS — R0600 Dyspnea, unspecified: Secondary | ICD-10-CM | POA: Diagnosis not present

## 2023-04-23 ENCOUNTER — Other Ambulatory Visit: Payer: Self-pay

## 2023-04-23 DIAGNOSIS — R0600 Dyspnea, unspecified: Secondary | ICD-10-CM

## 2023-05-04 ENCOUNTER — Ambulatory Visit: Payer: Medicare Other | Admitting: Family

## 2023-05-09 ENCOUNTER — Telehealth: Payer: Self-pay

## 2023-05-09 DIAGNOSIS — K219 Gastro-esophageal reflux disease without esophagitis: Secondary | ICD-10-CM

## 2023-05-09 NOTE — Addendum Note (Signed)
Addended by: [place], Verniece Encarnacion on: 05/09/2023 03:02 PM   Modules accepted: Orders

## 2023-05-09 NOTE — Telephone Encounter (Signed)
Appt already scheduled!, referral sent to GI and pt Daughter Jeanice Lim stated that pt is not going to take medication because it makes her stomach hurt really bad

## 2023-05-09 NOTE — Telephone Encounter (Signed)
Patient's daughter, Debbora Presto, called to state patient has been taking pantoprazole (PROTONIX) 20 MG tablet for two weeks, 2 pills per day, and it was hurting then.  Hollye states patient was supposed to reduce the dosage to one pill per day and she has been doing that for not quite a week and her stomach hurts every time she eats.  Hollye would like to know what Rennie Plowman, FNP, suggests.  Hollye states she isn't sure if patient may need an ultrasound.  Hollye states patient's mother had stomach cancer.

## 2023-05-09 NOTE — Telephone Encounter (Signed)
Spoke to pt daughter and informed her to stop the Protonix and I have d/c it from the chart.

## 2023-05-14 ENCOUNTER — Ambulatory Visit (INDEPENDENT_AMBULATORY_CARE_PROVIDER_SITE_OTHER): Payer: Medicare Other | Admitting: Family

## 2023-05-14 ENCOUNTER — Encounter: Payer: Self-pay | Admitting: Family

## 2023-05-14 VITALS — BP 128/82 | HR 78 | Temp 98.0°F | Ht 62.0 in | Wt 123.8 lb

## 2023-05-14 DIAGNOSIS — K5900 Constipation, unspecified: Secondary | ICD-10-CM | POA: Diagnosis not present

## 2023-05-14 DIAGNOSIS — K219 Gastro-esophageal reflux disease without esophagitis: Secondary | ICD-10-CM | POA: Diagnosis not present

## 2023-05-14 DIAGNOSIS — R899 Unspecified abnormal finding in specimens from other organs, systems and tissues: Secondary | ICD-10-CM | POA: Diagnosis not present

## 2023-05-14 NOTE — Progress Notes (Signed)
Assessment & Plan:  Gastroesophageal reflux disease without esophagitis Assessment & Plan: Chronic, overall stable.  No weight loss, pain with swallowing.  She has a history of esophageal stricture and again, politely declines gastroenterology follow-up at this time.  She is no longer on Protonix as worsened symptoms.  Advised to continue omeprazole 20 mg however education provided on how to take this medication 30 to 1-hour prior to breakfast.  Advise she may temporarily increase to 40 mg daily if needed. Consider pepcid.  Orders: -     DG Abd 2 Views; Future  Abnormal laboratory test -     CBC with Differential/Platelet  Constipation, unspecified constipation type Assessment & Plan: Presentation is consistent with constipation, straining.  Start MiraLAX every other day, titrate. Pending Ab Xr.       Return precautions given.   Risks, benefits, and alternatives of the medications and treatment plan prescribed today were discussed, and patient expressed understanding.   Education regarding symptom management and diagnosis given to patient on AVS either electronically or printed.  Return in about 6 weeks (around 06/25/2023).  Rennie Plowman, FNP  Subjective:    Patient ID: Daisy Holloway, female    DOB: 07-04-1930, 89 y.o.   MRN: [id number]  CC: Daisy Holloway is a 89 y.o. female who presents today for an acute visit.    HPI: Accompanied by daughter  Complains of epigastric abdominal pain after eating . Improved omeprazole 20mg  either at night or in morning prn.  She is taking probiotics.    She ate cereal and yogurt for breakfast, pimento cheese sandwich and Ensure for lunch.   she started on protonix 40mg  BID which made abdominal pain worse, she has since stopped and abdominal pain has improved.   She is drinking prune juice. She has always been 'constipated'.  Describes hard stool, straining. BM today.   She denies abdominal pain today. No fever, N, vomiting.  She is  eating her food slowly and chewing well, denies choking, weight loss     Burping helps.   H/o cholecystectomy  No nsaids, alcohol, known h/o PUD.  Dr Markham Jordan 08/30/2015 ; she was continued Protonix along with Carafate    Allergies: Sulfa antibiotics Current Outpatient Medications on File Prior to Visit  Medication Sig Dispense Refill   albuterol (VENTOLIN HFA) 108 (90 Base) MCG/ACT inhaler USE 2 INHALATIONS BY MOUTH  INTO THE LUNGS 4 TIMES  DAILY AS NEEDED 34 g 3   aspirin EC 81 MG tablet Take 81 mg by mouth daily.     atorvastatin (LIPITOR) 10 MG tablet TAKE 1 TABLET BY MOUTH  DAILY 90 tablet 3   Cholecalciferol (VITAMIN D3) 2000 UNITS capsule Take 5,000 Units by mouth daily.      fluticasone-salmeterol (ADVAIR) 250-50 MCG/ACT AEPB INHALE 1 INHALATION BY MOUTH  INTO THE LUNGS IN THE MORNING  AND AT BEDTIME 200 each 1   furosemide (LASIX) 20 MG tablet Take 1 tablet (20 mg total) by mouth daily. 90 tablet 3   hydrOXYzine (VISTARIL) 25 MG capsule Take 1 capsule (25 mg total) by mouth at bedtime. anxiety 90 capsule 1   ipratropium-albuterol (DUONEB) 0.5-2.5 (3) MG/3ML SOLN Take 3 mLs by nebulization See admin instructions. 360 mL 8   Multiple Vitamin (MULTI-VITAMINS) TABS Take by mouth. Reported on 06/13/2016     vitamin B-12 (CYANOCOBALAMIN) 1000 MCG tablet Take by mouth.     No current facility-administered medications on file prior to visit.    Review of Systems  Constitutional:  Negative for chills and fever.  HENT:  Negative for trouble swallowing.   Respiratory:  Negative for cough.   Cardiovascular:  Negative for chest pain and palpitations.  Gastrointestinal:  Positive for abdominal pain and constipation. Negative for diarrhea, nausea and vomiting.      Objective:    BP 128/82   Pulse 78   Temp 98 F (36.7 C) (Oral)   Ht 5\' 2"  (1.575 m)   Wt 123 lb 12.8 oz (56.2 kg)   SpO2 94%   BMI 22.64 kg/m   BP Readings from Last 3 Encounters:  05/14/23 128/82  03/26/23  130/72  03/21/23 118/72   Wt Readings from Last 3 Encounters:  05/14/23 123 lb 12.8 oz (56.2 kg)  03/26/23 124 lb 9.6 oz (56.5 kg)  03/21/23 123 lb 12.8 oz (56.2 kg)    Physical Exam Vitals reviewed.  Constitutional:      Appearance: Normal appearance. She is well-developed.  Eyes:     Conjunctiva/sclera: Conjunctivae normal.  Cardiovascular:     Rate and Rhythm: Normal rate and regular rhythm.     Pulses: Normal pulses.     Heart sounds: Normal heart sounds.  Pulmonary:     Effort: Pulmonary effort is normal.     Breath sounds: Normal breath sounds. No wheezing, rhonchi or rales.  Abdominal:     General: Bowel sounds are normal. There is no distension.     Palpations: Abdomen is soft. Abdomen is not rigid. There is no fluid wave or mass.     Tenderness: There is no abdominal tenderness. There is no guarding or rebound.  Skin:    General: Skin is warm and dry.  Neurological:     Mental Status: She is alert.  Psychiatric:        Speech: Speech normal.        Behavior: Behavior normal.        Thought Content: Thought content normal.

## 2023-05-14 NOTE — Assessment & Plan Note (Addendum)
Chronic, overall stable.  No weight loss, pain with swallowing.  She has a history of esophageal stricture and again, politely declines gastroenterology follow-up at this time.  She is no longer on Protonix as worsened symptoms.  Advised to continue omeprazole 20 mg however education provided on how to take this medication 30 to 1-hour prior to breakfast.  Advise she may temporarily increase to 40 mg daily if needed. Consider pepcid.

## 2023-05-14 NOTE — Assessment & Plan Note (Addendum)
Presentation is consistent with constipation, straining.  Start MiraLAX every other day, titrate. Pending Ab Xr.

## 2023-05-14 NOTE — Patient Instructions (Addendum)
Please start taking omeprazole 20mg  30 minutes to 1 hour prior to breakfast.  If you experience breakthrough acid reflux, you may increase this medication temporarily  ( less than 6 weeks) to 2 tablets in the morning, 40 mg.  We discussed constipation today.  You may stop the prune juice and may start MiraLAX over-the-counter every other or every third day to regulate your bowels.    Please ensure you drink plenty of water.

## 2023-05-15 LAB — CBC WITH DIFFERENTIAL/PLATELET
Basophils Absolute: 0 10*3/uL (ref 0.0–0.1)
Basophils Relative: 0.8 % (ref 0.0–3.0)
Eosinophils Absolute: 0.1 10*3/uL (ref 0.0–0.7)
Eosinophils Relative: 2.4 % (ref 0.0–5.0)
HCT: 40.1 % (ref 36.0–46.0)
Hemoglobin: 12.9 g/dL (ref 12.0–15.0)
Lymphocytes Relative: 22.7 % (ref 12.0–46.0)
Lymphs Abs: 1.3 10*3/uL (ref 0.7–4.0)
MCHC: 32 g/dL (ref 30.0–36.0)
MCV: 83.6 fl (ref 78.0–100.0)
Monocytes Absolute: 0.5 10*3/uL (ref 0.1–1.0)
Monocytes Relative: 7.8 % (ref 3.0–12.0)
Neutro Abs: 3.8 10*3/uL (ref 1.4–7.7)
Neutrophils Relative %: 66.3 % (ref 43.0–77.0)
Platelets: 168 10*3/uL (ref 150.0–400.0)
RBC: 4.8 Mil/uL (ref 3.87–5.11)
RDW: 15 % (ref 11.5–15.5)
WBC: 5.8 10*3/uL (ref 4.0–10.5)

## 2023-05-17 ENCOUNTER — Ambulatory Visit
Admission: RE | Admit: 2023-05-17 | Discharge: 2023-05-17 | Disposition: A | Discharge status: Home / Self Care | Payer: Medicare Other | Source: Ambulatory Visit | Attending: Family | Admitting: Family

## 2023-05-17 ENCOUNTER — Ambulatory Visit
Admission: RE | Admit: 2023-05-17 | Discharge: 2023-05-17 | Disposition: A | Discharge status: Home / Self Care | Payer: Medicare Other | Attending: Family | Admitting: Family

## 2023-05-17 DIAGNOSIS — K219 Gastro-esophageal reflux disease without esophagitis: Secondary | ICD-10-CM

## 2023-05-17 DIAGNOSIS — R109 Unspecified abdominal pain: Secondary | ICD-10-CM | POA: Diagnosis not present

## 2023-05-22 ENCOUNTER — Ambulatory Visit (INDEPENDENT_AMBULATORY_CARE_PROVIDER_SITE_OTHER): Payer: Medicare Other | Admitting: Family Medicine

## 2023-05-22 ENCOUNTER — Encounter: Payer: Self-pay | Admitting: Family Medicine

## 2023-05-22 VITALS — BP 112/68 | HR 78 | Temp 98.0°F | Ht 62.0 in | Wt 123.0 lb

## 2023-05-22 DIAGNOSIS — R0600 Dyspnea, unspecified: Secondary | ICD-10-CM | POA: Diagnosis not present

## 2023-05-22 DIAGNOSIS — B0230 Zoster ocular disease, unspecified: Secondary | ICD-10-CM | POA: Diagnosis not present

## 2023-05-22 MED ORDER — VALACYCLOVIR HCL 1 G PO TABS: 1000.0000 mg | ORAL_TABLET | Freq: Three times a day (TID) | ORAL | 0 refills | Status: AC

## 2023-05-22 NOTE — Progress Notes (Signed)
   SUBJECTIVE:   Chief Complaint  Patient presents with   Acute Visit    Blisters on face & eye since 05/21/23  Patient states it burns bad  Patient used antiseptic Dyna Hex 4 was on face on 05/21/23 and on 05/22/23 she used Triple Antibiotic Ointment    HPI Patient presents to clinic for acute visit  Symptoms started yesterday.  Burning sensation noted to face.  Wash face with Dyna-Hex for antiseptic without relief.  Use triple antibiotics today without relief.  Has worsened and spread to eye cheek and mouth.  Endorses blurry vision in left eye.  Has had shingles in the past.  Has not received shingles vaccination.  No recent sick contacts or viral illnesses.  PERTINENT PMH / PSH: None  OBJECTIVE:  BP 112/68   Pulse 78   Temp 98 F (36.7 C)   Ht 5\' 2"  (1.575 m)   Wt 123 lb (55.8 kg)   SpO2 97%   BMI 22.50 kg/m    Physical Exam Skin:    Findings: Rash present. Rash is vesicular.        ASSESSMENT/PLAN:  Herpes zoster with ophthalmic complication, unspecified herpes zoster eye disease Assessment & Plan: New problem Rash consistent with shingles outbreak. Start Valtrex 1 g 3 times daily Concern for ophthalmic involvement, spoke with Dr. Jeraldine Loots at [facility].  Will see patient in consultation today. Follow-up with PCP as needed.  Orders: -     valACYclovir HCl; Take 1 tablet (1,000 mg total) by mouth 3 (three) times daily for 7 days.  Dispense: 20 tablet; Refill: 0   PDMP reviewed  Return if symptoms worsen or fail to improve, for PCP.  Dana Allan, MD

## 2023-05-22 NOTE — Patient Instructions (Addendum)
It was a pleasure meeting you today. Thank you for allowing me to take part in your health care.  Our goals for today as we discussed include:  Go to [facility] with Dr Rolley Sims and they will work you in for eye exam  Start Valtrex 1000 mg three times a day for 7 days   If you have any questions or concerns, please do not hesitate to call the office at [phone].  I look forward to our next visit and until then take care and stay safe.  Regards,   Dana Allan, MD   [facility]

## 2023-05-25 ENCOUNTER — Ambulatory Visit: Payer: Medicare Other | Admitting: Family

## 2023-05-25 ENCOUNTER — Telehealth: Payer: Self-pay

## 2023-05-25 DIAGNOSIS — B029 Zoster without complications: Secondary | ICD-10-CM

## 2023-05-25 MED ORDER — TRAMADOL HCL 50 MG PO TABS: 50.0000 mg | ORAL_TABLET | Freq: Two times a day (BID) | ORAL | 0 refills | Status: AC | PRN

## 2023-05-25 NOTE — Telephone Encounter (Signed)
Please call and triage patient.    I want to ensure as I did not see patient that  headache that she has described as coming from the rash versus something else  .  Please ensure no dizziness, facial numbness, vision changes or that she does not have lesions in the eye or very close to the eye.     I would feel most comfortable if she was reevaluated in urgent care to ensure nothing else is going on.  Is she okay going across the street to [facility] ?  If she refuses, I could trial gabapentin 100mg  at bedtime to see if helpful to start.  May consider tramadol as well which is an opioid like medication. Please ask if any h/o seizure as contraindication to tramadol

## 2023-05-25 NOTE — Telephone Encounter (Signed)
Noted Per your note and our conversation, she has been on tramadol in the past  I have sent in tramadol  I opted not to send in gabapentin the same time as taking both of these can be quite sedating.  Will start with 1 agent at a time.

## 2023-05-25 NOTE — Telephone Encounter (Signed)
Spoke with pt to let her know that the tramadol has been sent in. Advised pt to let us know if it helps or not. Pt gave a verbal understanding.

## 2023-05-25 NOTE — Telephone Encounter (Signed)
Spoke with pt and she stated that there is a lesion close to her eye but that she saw the eye doctor. She stated that eye doctor gave her erythromycin ointment to put in the eye and told her that it has not affected her vision. She is not having any symptom except for the pain and burning on the left side of her face which is keeping her awake at night. Pt also stated that she is not able to eat because she can't put her dentures in due to the lesions in her mouth. Pt would like to try the gabapentin and the tramadol for pain. Pt is currently taking the valtrex that was prescribed.

## 2023-05-25 NOTE — Telephone Encounter (Signed)
Patient's daughter, Debbora Presto, called to state patient has shingles on the left side of her face, on her lip, inside her mouth, and on her eyelid.  Hollye states they saw Dr. Dana Allan on 05/22/2023, and then they went to the eye doctor.  Hollye states the eye doctor gave patient salve to apply to her eye.  Jeanice Lim states the eye doctor said she could use the salve on her face as well.  Hollye states Dr Dana Allan prescribed valACYclovir (VALTREX) 1000 MG tablet and patient has been taking this.  Hollye states patient is experiencing pain in her head on the left side which comes on suddenly.  Hollye states patient is taking extra strength tylenol for pain, but it's not controlling it.  Hollye states patient is having trouble eating, she can't put her dentures in, so she is blending a lot of her foods.  Hollye states patient would like to have something for pain and would like to know if Dr. Clent Ridges can recommend some other kind of salve since the calamine lotion is not working.  Hollye states they would like to have these today since we are going into the weekend.  Hollye states patient's preferred pharmacy is [facility] in [place].

## 2023-05-27 ENCOUNTER — Encounter: Payer: Self-pay | Admitting: Family Medicine

## 2023-05-27 DIAGNOSIS — B0230 Zoster ocular disease, unspecified: Secondary | ICD-10-CM | POA: Insufficient documentation

## 2023-05-27 NOTE — Assessment & Plan Note (Signed)
New problem Rash consistent with shingles outbreak. Start Valtrex 1 g 3 times daily Concern for ophthalmic involvement, spoke with Dr. Jeraldine Loots at [facility].  Will see patient in consultation today. Follow-up with PCP as needed.

## 2023-06-08 ENCOUNTER — Telehealth: Payer: Self-pay

## 2023-06-08 ENCOUNTER — Ambulatory Visit
Admission: EM | Admit: 2023-06-08 | Discharge: 2023-06-08 | Disposition: A | Discharge status: Home / Self Care | Payer: Medicare Other | Attending: Emergency Medicine | Admitting: Emergency Medicine

## 2023-06-08 DIAGNOSIS — B0230 Zoster ocular disease, unspecified: Secondary | ICD-10-CM

## 2023-06-08 MED ORDER — KETOROLAC TROMETHAMINE 30 MG/ML IJ SOLN
30.0000 mg | Freq: Once | INTRAMUSCULAR | Status: AC
  Administered 2023-06-08: 30 mg via INTRAMUSCULAR

## 2023-06-08 MED ORDER — OXYCODONE HCL 5 MG PO TABS: 5.0000 mg | ORAL_TABLET | Freq: Four times a day (QID) | ORAL | 0 refills | Status: AC | PRN

## 2023-06-08 MED ORDER — VALACYCLOVIR HCL 1 G PO TABS: 1000.0000 mg | ORAL_TABLET | Freq: Three times a day (TID) | ORAL | 0 refills | Status: DC

## 2023-06-08 MED ORDER — ERYTHROMYCIN 5 MG/GM OP OINT: TOPICAL_OINTMENT | OPHTHALMIC | 0 refills | Status: DC

## 2023-06-08 MED ORDER — TRAMADOL HCL 50 MG PO TABS: 50.0000 mg | ORAL_TABLET | Freq: Four times a day (QID) | ORAL | 0 refills | Status: DC | PRN

## 2023-06-08 NOTE — Telephone Encounter (Signed)
LMTCB

## 2023-06-08 NOTE — Telephone Encounter (Signed)
Called Patient she confirmed that she is waiting on her daughter to get her and take her to [facility] in [place] to be evaluated for this issue.

## 2023-06-08 NOTE — Telephone Encounter (Signed)
Patient's daughter, Daisy Holloway, called to state patient is having pain that shoots across her head to the blisters on her face, mouth, top lip, and neck (shingles) and patient has had more blisters come up on the back of her neck, right below her hairline.  Daisy Holloway states patient has run out of pain medication.  Daisy Holloway states she is concerned about patient because she can usually take a lot and she is really in pain.  I transferred call to Access Nurse.

## 2023-06-08 NOTE — Discharge Instructions (Addendum)
Today all medicine has been refilled for shingles virus  You been given an injection of Toradol which helps reduce pain, ideally to see some effects in about 30 minutes to an hour, you may take the tramadol whenever you receive the medicine from the pharmacy  Take valacyclovir every 8 hours until rash is scabbed over, you have up to 2 weeks of medicine  You may use tramadol every 6 hours as needed for severe pain, please be mindful of this medicine can make you feel drowsy  Erythromycin ointment has been refilled you may take as directed by ophthalmologist  Please keep upcoming appointment with eye doctor for reevaluation

## 2023-06-08 NOTE — ED Provider Notes (Addendum)
[facility]    CSN: [id number] Arrival date & time: 06/08/23  1327      History   Chief Complaint Chief Complaint  Patient presents with   Herpes Zoster    HPI Daisy Holloway is a 89 y.o. female.   Patient presents for evaluation of a persisting shingles rash to the left side of the face.  Pain rated a 10 out of 10, described as severe.  Has been seen by PCP and ophthalmologist, prescribed valacyclovir, taken all 7 days of medicine, endorses that symptoms were improving that after course was finished, symptoms flared.  Was prescribed tramadol for pain, endorses minimal improvement.  Prescribed erythromycin ointment from the ophthalmologist and has follow-up on July 17.  Denies visual changes.  Past Medical History:  Diagnosis Date   Anemia    IDA   Arthritis    Asthma    GERD (gastroesophageal reflux disease)    Iron deficiency anemia due to chronic blood loss 04/01/2015   Osteoporosis     Patient Active Problem List   Diagnosis Date Noted   Herpes zoster with ophthalmic complication 05/27/2023   Constipation 05/14/2023   SOB (shortness of breath) 02/07/2023   Chronic obstructive pulmonary disease (HCC) 09/28/2022   Anxiety 09/28/2022   Post-COVID syndrome resolved 06/23/2021   GERD (gastroesophageal reflux disease)    Annual physical exam 09/16/2020   Mitral valve disease 09/16/2020   Chronic bronchitis (HCC) 07/19/2020   Anxiety as acute reaction to exceptional stress 04/16/2020   Chronic fatigue 04/16/2020   Iron deficiency anemia due to chronic blood loss 04/01/2015    Past Surgical History:  Procedure Laterality Date   ABDOMINAL HYSTERECTOMY  1964   ovaries left in   BREAST BIOPSY Right 1998   patient states calcifications-benign   CATARACT EXTRACTION     one eye in 2007 and the other eye 2012   CHOLECYSTECTOMY     KNEE SURGERY Right 2004   KNEE SURGERY Left 2012   SHOULDER SURGERY  1995    OB History   No obstetric history on file.       Home Medications    Prior to Admission medications   Medication Sig Start Date End Date Taking? Authorizing Provider  aspirin EC 81 MG tablet Take 81 mg by mouth daily.   Yes [provider]  atorvastatin (LIPITOR) 10 MG tablet TAKE 1 TABLET BY MOUTH  DAILY 03/30/22  Yes Masoud, Renda Rolls, MD  erythromycin ophthalmic ointment Place a 1/2 inch ribbon of ointment into the lower eyelid. 06/08/23  Yes Brianda Beitler R, NP  furosemide (LASIX) 20 MG tablet Take 1 tablet (20 mg total) by mouth daily. 02/02/23  Yes Agbor-Etang, Arlys John, MD  oxyCODONE (ROXICODONE) 5 MG immediate release tablet Take 1 tablet (5 mg total) by mouth every 6 (six) hours as needed for up to 12 days for severe pain. 06/08/23 06/20/23 Yes Mayan Kloepfer, Elita Boone, NP  valACYclovir (VALTREX) 1000 MG tablet Take 1 tablet (1,000 mg total) by mouth 3 (three) times daily for 14 days. 06/08/23 06/22/23 Yes Detrich Rakestraw R, NP  vitamin B-12 (CYANOCOBALAMIN) 1000 MCG tablet Take by mouth.   Yes [provider]  albuterol (VENTOLIN HFA) 108 (90 Base) MCG/ACT inhaler USE 2 INHALATIONS BY MOUTH  INTO THE LUNGS 4 TIMES  DAILY AS NEEDED 03/26/23   Allegra Grana, FNP  Cholecalciferol (VITAMIN D3) 2000 UNITS capsule Take 5,000 Units by mouth daily.     [provider]  fluticasone-salmeterol (ADVAIR) 250-50  MCG/ACT AEPB INHALE 1 INHALATION BY MOUTH  INTO THE LUNGS IN THE MORNING  AND AT BEDTIME 11/14/22   Corky Downs, MD  hydrOXYzine (VISTARIL) 25 MG capsule Take 1 capsule (25 mg total) by mouth at bedtime. anxiety 03/21/23   Allegra Grana, FNP  ipratropium-albuterol (DUONEB) 0.5-2.5 (3) MG/3ML SOLN Take 3 mLs by nebulization See admin instructions. 12/13/22   Katha Cabal, DO  Multiple Vitamin (MULTI-VITAMINS) TABS Take by mouth. Reported on 06/13/2016    [provider]    Family History Family History  Problem Relation Age of Onset   Liver cancer Mother    Arthritis Mother    Gastric cancer Father     Asthma Father    Diabetes Sister    Diabetes Brother    Breast cancer Maternal Aunt    Breast cancer Cousin     Social History Social History   Tobacco Use   Smoking status: Never    Passive exposure: Past   Smokeless tobacco: Never  Vaping Use   Vaping Use: Never used  Substance Use Topics   Alcohol use: Yes    Comment: occ. wine   Drug use: No     Allergies   Sulfa antibiotics   Review of Systems Review of Systems   Physical Exam Triage Vital Signs ED Triage Vitals [06/08/23 1401]  Enc Vitals Group     BP (!) 142/71     Pulse Rate 73     Resp 16     Temp 98.9 F (37.2 C)     Temp Source Oral     SpO2 99 %     Weight      Height      Head Circumference      Peak Flow      Pain Score      Pain Loc      Pain Edu?      Excl. in GC?    No data found.  Updated Vital Signs BP (!) 142/71 (BP Location: Left Arm)   Pulse 73   Temp 98.9 F (37.2 C) (Oral)   Resp 16   SpO2 99%   Visual Acuity Right Eye Distance:   Left Eye Distance:   Bilateral Distance:    Right Eye Near:   Left Eye Near:    Bilateral Near:     Physical Exam Constitutional:      Appearance: Normal appearance.  HENT:     Head:     Comments: Defer to photos completed on 05/22/2023 by PCP Eyes:     Extraocular Movements: Extraocular movements intact.  Pulmonary:     Effort: Pulmonary effort is normal.  Neurological:     Mental Status: She is alert and oriented to person, place, and time. Mental status is at baseline.      UC Treatments / Results  Labs (all labs ordered are listed, but only abnormal results are displayed) Labs Reviewed - No data to display  EKG   Radiology No results found.  Procedures Procedures (including critical care time)  Medications Ordered in UC Medications  ketorolac (TORADOL) 30 MG/ML injection 30 mg (30 mg Intramuscular Given 06/08/23 1438)    Initial Impression / Assessment and Plan / UC Course  I have reviewed the triage vital  signs and the nursing notes.  Pertinent labs & imaging results that were available during my care of the patient were reviewed by me and considered in my medical decision making (see chart for details).  Herpes  zoster with ophthalmic complication  Requesting in office medication for pain, discussed strongest medicine in clinic is a Toradol injection, would like to move forward with treatment, given ,valacyclovir refilled as rash has not appeared to begin to scab, attempting to minimize viral load, endorses ineffectiveness of tramadol therefore oxycodone prescribed, discussed side effects of drowsiness, advised to use cautiously, refill erythromycin ointment and advised keeping upcoming appointment with ophthalmologist, may follow-up with PCP for further management Final Clinical Impressions(s) / UC Diagnoses   Final diagnoses:  Herpes zoster with ophthalmic complication, unspecified herpes zoster eye disease     Discharge Instructions      Today all medicine has been refilled for shingles virus  You been given an injection of Toradol which helps reduce pain, ideally to see some effects in about 30 minutes to an hour, you may take the tramadol whenever you receive the medicine from the pharmacy  Take valacyclovir every 8 hours until rash is scabbed over, you have up to 2 weeks of medicine  You may use tramadol every 6 hours as needed for severe pain, please be mindful of this medicine can make you feel drowsy  Erythromycin ointment has been refilled you may take as directed by ophthalmologist  Please keep upcoming appointment with eye doctor for reevaluation   ED Prescriptions     Medication Sig Dispense Auth. Provider   valACYclovir (VALTREX) 1000 MG tablet Take 1 tablet (1,000 mg total) by mouth 3 (three) times daily for 14 days. 42 tablet Seymore Brodowski R, NP   erythromycin ophthalmic ointment Place a 1/2 inch ribbon of ointment into the lower eyelid. 3.5 g Alysen Smylie R, NP    traMADol (ULTRAM) 50 MG tablet  (Status: Discontinued) Take 1 tablet (50 mg total) by mouth every 6 (six) hours as needed. 15 tablet Rahkeem Senft R, NP   oxyCODONE (ROXICODONE) 5 MG immediate release tablet Take 1 tablet (5 mg total) by mouth every 6 (six) hours as needed for up to 12 days for severe pain. 12 tablet Corrion Stirewalt, Elita Boone, NP      I have reviewed the PDMP during this encounter.   Valinda Hoar, NP 06/08/23 1446    Valinda Hoar, NP 06/08/23 1446

## 2023-06-08 NOTE — Telephone Encounter (Signed)
I would advise the patient to be rechecked in person for this issue given that it has been a couple of weeks since this was diagnosed.

## 2023-06-08 NOTE — Telephone Encounter (Signed)
Access nurse called back stating that pt needs to be seen with 4 hrs. We dot not have any opening left for the day. She stated if provider can sent some meds over since she was already been seen for the same issue? She stated she will tell pt to go to UC today.

## 2023-06-08 NOTE — ED Triage Notes (Signed)
Pt presents to the office for shingles flare-up. Pt report blister around her eyes and face. Pt reports sores in her mouth.  Pt is requesting a refill on valtrex and pain medication.

## 2023-06-09 ENCOUNTER — Emergency Department
Admission: EM | Admit: 2023-06-09 | Discharge: 2023-06-10 | Disposition: A | Discharge status: Home / Self Care | Payer: Medicare Other | Attending: Student in an Organized Health Care Education/Training Program | Admitting: Student in an Organized Health Care Education/Training Program

## 2023-06-09 ENCOUNTER — Other Ambulatory Visit: Payer: Self-pay

## 2023-06-09 DIAGNOSIS — B0222 Postherpetic trigeminal neuralgia: Secondary | ICD-10-CM

## 2023-06-09 DIAGNOSIS — B028 Zoster with other complications: Secondary | ICD-10-CM | POA: Insufficient documentation

## 2023-06-09 DIAGNOSIS — J45909 Unspecified asthma, uncomplicated: Secondary | ICD-10-CM | POA: Insufficient documentation

## 2023-06-09 DIAGNOSIS — R531 Weakness: Secondary | ICD-10-CM | POA: Diagnosis not present

## 2023-06-09 DIAGNOSIS — B029 Zoster without complications: Secondary | ICD-10-CM | POA: Diagnosis not present

## 2023-06-09 DIAGNOSIS — Z743 Need for continuous supervision: Secondary | ICD-10-CM | POA: Diagnosis not present

## 2023-06-09 DIAGNOSIS — R6889 Other general symptoms and signs: Secondary | ICD-10-CM | POA: Diagnosis not present

## 2023-06-09 DIAGNOSIS — G500 Trigeminal neuralgia: Secondary | ICD-10-CM | POA: Diagnosis not present

## 2023-06-09 MED ORDER — TRIAMCINOLONE ACETONIDE 0.1 % EX OINT: 1.0000 | TOPICAL_OINTMENT | Freq: Two times a day (BID) | CUTANEOUS | 1 refills | Status: DC

## 2023-06-09 MED ORDER — PREDNISONE 10 MG PO TABS: ORAL_TABLET | ORAL | 0 refills | Status: AC

## 2023-06-09 MED ORDER — PREGABALIN 150 MG PO CAPS: 150.0000 mg | ORAL_CAPSULE | Freq: Two times a day (BID) | ORAL | 0 refills | Status: DC

## 2023-06-09 MED ORDER — ONDANSETRON 4 MG PO TBDP: 4.0000 mg | ORAL_TABLET | Freq: Three times a day (TID) | ORAL | 0 refills | Status: DC | PRN

## 2023-06-09 MED ORDER — PREGABALIN 50 MG PO CAPS
150.0000 mg | ORAL_CAPSULE | Freq: Once | ORAL | Status: AC
  Administered 2023-06-09: 150 mg via ORAL
  Filled 2023-06-09: qty 3

## 2023-06-09 MED ORDER — OXYCODONE HCL 5 MG PO TABS
5.0000 mg | ORAL_TABLET | Freq: Once | ORAL | Status: AC
  Administered 2023-06-09: 5 mg via ORAL
  Filled 2023-06-09: qty 1

## 2023-06-09 MED ORDER — ONDANSETRON 4 MG PO TBDP
4.0000 mg | ORAL_TABLET | Freq: Once | ORAL | Status: AC
  Administered 2023-06-09: 4 mg via ORAL
  Filled 2023-06-09: qty 1

## 2023-06-09 NOTE — ED Triage Notes (Signed)
Pt to ed from home via [facility] for shingles to the face. Pt was seen yesterday for same at [facility] in [place]. Pt advised it is affecting her ability to eat anything as well. Pt is caox4, in no acute distress and in wheel chair in triage. She had her valtrex refilled and took it today at 330pm.

## 2023-06-09 NOTE — ED Provider Notes (Signed)
[facility] Emergency Department Provider Note     Event Date/Time   First MD Initiated Contact with Patient 06/09/23 2026     (approximate)   History   Rash (zoster)   HPI  Daisy Holloway is a 89 y.o. female history of osteoporosis, arthritis, GERD, asthma, presents to the ED for evaluation of ongoing pain related to her herpes zoster.  Presents to the ED via EMS from home, for ongoing discomfort and tearing to the left eye.  Patient has been evaluated by both ophthalmology and [facility] yesterday.  She was given a second course of valacyclovir, oxycodone, but continues to complain of pain.  She completed a course of valacyclovir about 2 weeks prior with the initial diagnosis.  She reports limited benefit with the oxycodone that she is only been taking about half a tablet every 12 hours.  She endorses nausea as well as poor appetite secondary to the pain.   Physical Exam   Triage Vital Signs: ED Triage Vitals  Enc Vitals Group     BP 06/09/23 1718 129/66     Pulse Rate 06/09/23 1718 78     Resp 06/09/23 1718 16     Temp 06/09/23 1718 97.9 F (36.6 C)     Temp Source 06/09/23 1718 Oral     SpO2 06/09/23 1718 95 %     Weight 06/09/23 1719 121 lb 4.1 oz (55 kg)     Height 06/09/23 1719 5\' 2"  (1.575 m)     Head Circumference --      Peak Flow --      Pain Score 06/09/23 1719 5     Pain Loc --      Pain Edu? --      Excl. in GC? --     Most recent vital signs: Vitals:   06/09/23 1718  BP: 129/66  Pulse: 78  Resp: 16  Temp: 97.9 F (36.6 C)  SpO2: 95%    General Awake, no distress. NAD HEENT NCAT. PERRL. EOMI. No rhinorrhea. Mucous membranes are moist.  Patient with a crusting vesicular rash noted primarily to the left side of her face including the forehead, and the malar cheek. CV:  Good peripheral perfusion.  RESP:  Normal effort.  ABD:  No distention.    ED Results / Procedures / Treatments   Labs (all labs ordered are  listed, but only abnormal results are displayed) Labs Reviewed - No data to display   EKG   RADIOLOGY  No results found.   PROCEDURES:  Critical Care performed: No  Procedures   MEDICATIONS ORDERED IN ED: Medications  ondansetron (ZOFRAN-ODT) disintegrating tablet 4 mg (4 mg Oral Given 06/09/23 2158)  pregabalin (LYRICA) capsule 150 mg (150 mg Oral Given 06/09/23 2155)  oxyCODONE (Oxy IR/ROXICODONE) immediate release tablet 5 mg (5 mg Oral Given 06/09/23 2156)     IMPRESSION / MDM / ASSESSMENT AND PLAN / ED COURSE  I reviewed the triage vital signs and the nursing notes.                              Differential diagnosis includes, but is not limited to, thoracic neuralgia, trigeminal neuralgia, herpes zoster with complication  Patient's presentation is most consistent with acute complicated illness / injury requiring diagnostic workup.  Patient's diagnosis is consistent with herpes zoster with complication.  Patient with an appropriate treatment course including high-dose valacyclovir started  yesterday.  We discussed that in detail and ongoing management of her zoster as well as anticipated neuropathic pain, after the rash resolves.  Patient will be discharged home with prescriptions for Zofran, prednisone taper, triamcinolone ointment, and pregabalin. Patient is to follow up with her primary provider as needed or otherwise directed. Patient is given ED precautions to return to the ED for any worsening or new symptoms.   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     Rx / DC Orders   ED Discharge Orders          Ordered    ondansetron (ZOFRAN-ODT) 4 MG disintegrating tablet  Every 8 hours PRN        06/09/23 2244    pregabalin (LYRICA) 150 MG capsule  2 times daily        06/09/23 2244    triamcinolone ointment (KENALOG) 0.1 %  2 times daily,   Status:   Discontinued        06/09/23 2244    predniSONE (DELTASONE) 10 MG tablet  Q breakfast        06/09/23 2244    triamcinolone ointment (KENALOG) 0.1 %  2 times daily        06/09/23 2244             Note:  This document was prepared using Dragon voice recognition software and may include unintentional dictation errors.    Lissa Hoard, PA-C 06/09/23 2337    Willy Eddy, MD 06/09/23 [phone]

## 2023-06-09 NOTE — Discharge Instructions (Addendum)
Continue with the previously prescribed antiviral medication.  Take the prescription medicines for nausea, vomiting, and nerve pain as directed.  Consider restarting your OTC vitamin B12 as well to help with nerve pain.

## 2023-06-11 NOTE — Telephone Encounter (Signed)
Seen in ED 06/09/23

## 2023-06-12 ENCOUNTER — Telehealth: Payer: Self-pay

## 2023-06-12 NOTE — Transitions of Care (Post Inpatient/ED Visit) (Signed)
06/12/2023  Name: Daisy Holloway MRN: [id number] DOB: 03-Oct-1930  Today's TOC FU Call Status: Today's TOC FU Call Status:: Successful TOC FU Call Competed TOC FU Call Complete Date: 06/12/23  Red on EMMI-ED Discharge Alert Date & Reason:06/11/23 "Scheduled follow-up appt? No"  Transition Care Management Follow-up Telephone Call Date of Discharge: 06/09/23 Discharge Facility: [facility]) Type of Discharge: Emergency Department Reason for ED Visit: Other: How have you been since you were released from the hospital?: Better (Pt voices she has "been through hell but finally feeling better." Pain is improving and she can open her eye now. She has finally been able to eat again-nausea managed with Zofran. She reports she still has some "numbness to face& nose") Any questions or concerns?: No  Items Reviewed: Did you receive and understand the discharge instructions provided?: Yes Medications obtained,verified, and reconciled?: Partial Review Completed Reason for Partial Mediation Review: pt voices daughter manages meds for her-fills med planner-not currenttly in home-reviewed new meds and sx mgmt meds with pt Any new allergies since your discharge?: No Dietary orders reviewed?: NA Do you have support at home?: Yes People in Home: child(ren), adult, grandchild(ren) Name of Support/Comfort Primary Source: family rotating and helping care for her while she recovers  Medications Reviewed Today: Medications Reviewed Today     Reviewed by Charlyn Minerva, RN (Registered Nurse) on 06/12/23 at 1541  Med List Status: <None>   Medication Order Taking? Sig Documenting Provider Last Dose Status Informant  albuterol (VENTOLIN HFA) 108 (90 Base) MCG/ACT inhaler [id number]  USE 2 INHALATIONS BY MOUTH  INTO THE LUNGS 4 TIMES  DAILY AS NEEDED Allegra Grana, FNP  Active   aspirin EC 81 MG tablet [id number]  Take 81 mg by mouth daily. [provider]   Active   atorvastatin (LIPITOR) 10 MG tablet [id number]  TAKE 1 TABLET BY MOUTH  DAILY Masoud, Javed, MD  Active   Cholecalciferol (VITAMIN D3) 2000 UNITS capsule [id number]  Take 5,000 Units by mouth daily.  [provider]  Active            Med Note Jeanice Lim, Corky Crafts Jul 29, 2015  9:19 AM) Received from: [facility]  erythromycin ophthalmic ointment [id number]  Place a 1/2 inch ribbon of ointment into the lower eyelid. Valinda Hoar, NP  Active   fluticasone-salmeterol (ADVAIR) 250-50 MCG/ACT AEPB [id number]  INHALE 1 INHALATION BY MOUTH  INTO THE LUNGS IN THE MORNING  AND AT BEDTIME Corky Downs, MD  Active   furosemide (LASIX) 20 MG tablet [id number]  Take 1 tablet (20 mg total) by mouth daily. Debbe Odea, MD  Active   hydrOXYzine (VISTARIL) 25 MG capsule [id number]  Take 1 capsule (25 mg total) by mouth at bedtime. anxiety Allegra Grana, FNP  Active   ipratropium-albuterol (DUONEB) 0.5-2.5 (3) MG/3ML SOLN [id number]  Take 3 mLs by nebulization See admin instructions. Katha Cabal, DO  Active   Multiple Vitamin (MULTI-VITAMINS) TABS [id number]  Take by mouth. Reported on 06/13/2016 [provider]  Active            Med Note Jeanice Lim, Corky Crafts Jul 29, 2015  9:19 AM) Received from: [facility]  omeprazole (PRILOSEC) 20 MG capsule [id number]  Take 20 mg by mouth daily. [provider]  Active   ondansetron (ZOFRAN-ODT) 4 MG disintegrating tablet [id number] Yes Take 1 tablet (4 mg total) by mouth every 8 (eight) hours  as needed for nausea or vomiting. Menshew, Charlesetta Ivory, PA-C Taking Active   oxyCODONE (ROXICODONE) 5 MG immediate release tablet [id number] Yes Take 1 tablet (5 mg total) by mouth every 6 (six) hours as needed for up to 12 days for severe pain. Valinda Hoar, NP Taking Active   predniSONE (DELTASONE) 10 MG tablet [id number] Yes Take 4 tablets (40 mg total) by mouth daily with breakfast for 2  days, THEN 3 tablets (30 mg total) daily with breakfast for 2 days, THEN 2 tablets (20 mg total) daily with breakfast for 2 days, THEN 1 tablet (10 mg total) daily with breakfast for 2 days, THEN 0.5 tablets (5 mg total) daily with breakfast for 2 days. Menshew, Charlesetta Ivory, PA-C Taking Active   pregabalin (LYRICA) 150 MG capsule [id number] Yes Take 1 capsule (150 mg total) by mouth 2 (two) times daily. Menshew, Charlesetta Ivory, PA-C Taking Active   triamcinolone ointment (KENALOG) 0.1 % [id number] Yes Apply 1 Application topically 2 (two) times daily. Menshew, Charlesetta Ivory, PA-C Taking Active   valACYclovir (VALTREX) 1000 MG tablet [id number] Yes Take 1 tablet (1,000 mg total) by mouth 3 (three) times daily for 14 days. Valinda Hoar, NP Taking Active   vitamin B-12 (CYANOCOBALAMIN) 1000 MCG tablet [id number]  Take by mouth. [provider]  Active             Home Care and Equipment/Supplies: Were Home Health Services Ordered?: NA Any new equipment or medical supplies ordered?: NA  Functional Questionnaire: Do you need assistance with bathing/showering or dressing?: Yes Do you need assistance with meal preparation?: Yes Do you need assistance with eating?: No Do you have difficulty maintaining continence: Yes Do you need assistance with getting out of bed/getting out of a chair/moving?: Yes Do you have difficulty managing or taking your medications?: Yes  Follow up appointments reviewed: PCP Follow-up appointment confirmed?: Yes Date of PCP follow-up appointment?: 06/29/23 (pt had appt prior to ED visit-did not wish to see MD sooner-care guide changed visit type to ED/hosp f/u) Follow-up Provider: Claris Che [facility] Follow-up appointment confirmed?: Yes Follow-Up Specialty Provider:: Pt voices she has follow up appt with eye doctor coming up soon-unable to recall date Do you need transportation to your follow-up appointment?: No (pt confirms family  able to take her to appts) Do you understand care options if your condition(s) worsen?: Yes-patient verbalized understanding  SDOH Interventions Today    Flowsheet Row Most Recent Value  SDOH Interventions   Food Insecurity Interventions Intervention Not Indicated  Transportation Interventions Intervention Not Indicated      TOC Interventions Today    Flowsheet Row Most Recent Value  TOC Interventions   TOC Interventions Discussed/Reviewed TOC Interventions Discussed, S/S of infection, Arranged PCP follow up less than 12 days/Care Guide scheduled      Interventions Today    Flowsheet Row Most Recent Value  General Interventions   General Interventions Discussed/Reviewed General Interventions Discussed, Doctor Visits, Vaccines  Vaccines Shingles  [pt states she got vaccine abot 20yrs ago-she will discuss with provider about when she can get vaccine after sxs resolve]  Doctor Visits Discussed/Reviewed Doctor Visits Discussed, Specialist, PCP  PCP/Specialist Visits Compliance with follow-up visit  Education Interventions   Education Provided Provided Education  Provided Verbal Education On Medication, Nutrition, When to see the doctor, Other  [pain mgmt, bowel mgmt-pt states she had gone 5days without BM but drank some prune juice this AM and finally had a good  BM-discussed w/ pt taking OTC stool softener/laxative while on prescription pain med-she prefers to just drink prune juice daily]  Nutrition Interventions   Nutrition Discussed/Reviewed Nutrition Discussed, Adding fruits and vegetables, Decreasing salt, Increasing proteins, Decreasing fats, Fluid intake  Pharmacy Interventions   Pharmacy Dicussed/Reviewed Pharmacy Topics Discussed, Medications and their functions  Safety Interventions   Safety Discussed/Reviewed Safety Discussed, Home Safety, Fall Risk       Alessandra Grout [facility] Care Management Care Management Community Coordinator Direct  Phone: [phone] Toll Free: [phone] Fax: [phone]

## 2023-06-21 DIAGNOSIS — R0600 Dyspnea, unspecified: Secondary | ICD-10-CM | POA: Diagnosis not present

## 2023-06-22 ENCOUNTER — Ambulatory Visit
Admission: EM | Admit: 2023-06-22 | Discharge: 2023-06-22 | Disposition: A | Discharge status: Home / Self Care | Payer: Medicare Other | Source: Home / Self Care

## 2023-06-22 DIAGNOSIS — B0230 Zoster ocular disease, unspecified: Secondary | ICD-10-CM | POA: Diagnosis not present

## 2023-06-22 DIAGNOSIS — B028 Zoster with other complications: Secondary | ICD-10-CM | POA: Diagnosis not present

## 2023-06-22 MED ORDER — ONDANSETRON 4 MG PO TBDP: 4.0000 mg | ORAL_TABLET | Freq: Three times a day (TID) | ORAL | 0 refills | Status: DC | PRN

## 2023-06-22 MED ORDER — VALACYCLOVIR HCL 1 G PO TABS
1000.0000 mg | ORAL_TABLET | Freq: Three times a day (TID) | ORAL | 0 refills | Status: DC
Start: 2023-06-22 — End: 2023-06-29

## 2023-06-22 NOTE — ED Provider Notes (Signed)
Daisy Holloway    CSN: [id number] Arrival date & time: 06/22/23  1145      History   Chief Complaint Chief Complaint  Patient presents with   Herpes Zoster   Medication Refill    HPI Daisy Holloway is a 89 y.o. female presents for evaluation of shingles. Pt has been treating a shingles outbreak on the left side of her face for one month. She was seen in UC on 7/5 and treated with valtrex, erythromycin, and oxycodone. She was seen again in the ER on 7/6 for worsening pain and was prescribed zofran, prednisone, lyrica, and kenalog cream. She has seen her ophthalmologist already and has a follow up appt today. She reports her sx are better and she would like a refill on her zofran and valtrex. Her PCP if closed today due to the [employer] cloud malfunction. No other concerns at this time.    Medication Refill   Past Medical History:  Diagnosis Date   Anemia    IDA   Arthritis    Asthma    GERD (gastroesophageal reflux disease)    Iron deficiency anemia due to chronic blood loss 04/01/2015   Osteoporosis     Patient Active Problem List   Diagnosis Date Noted   Herpes zoster with ophthalmic complication 05/27/2023   Constipation 05/14/2023   SOB (shortness of breath) 02/07/2023   Chronic obstructive pulmonary disease (HCC) 09/28/2022   Anxiety 09/28/2022   Post-COVID syndrome resolved 06/23/2021   GERD (gastroesophageal reflux disease)    Annual physical exam 09/16/2020   Mitral valve disease 09/16/2020   Chronic bronchitis (HCC) 07/19/2020   Anxiety as acute reaction to exceptional stress 04/16/2020   Chronic fatigue 04/16/2020   Iron deficiency anemia due to chronic blood loss 04/01/2015    Past Surgical History:  Procedure Laterality Date   ABDOMINAL HYSTERECTOMY  1964   ovaries left in   BREAST BIOPSY Right 1998   patient states calcifications-benign   CATARACT EXTRACTION     one eye in 2007 and the other eye 2012   CHOLECYSTECTOMY     KNEE SURGERY  Right 2004   KNEE SURGERY Left 2012   SHOULDER SURGERY  1995    OB History   No obstetric history on file.      Home Medications    Prior to Admission medications   Medication Sig Start Date End Date Taking? Authorizing Provider  albuterol (VENTOLIN HFA) 108 (90 Base) MCG/ACT inhaler USE 2 INHALATIONS BY MOUTH  INTO THE LUNGS 4 TIMES  DAILY AS NEEDED 03/26/23   Allegra Grana, FNP  aspirin EC 81 MG tablet Take 81 mg by mouth daily.    [provider]  atorvastatin (LIPITOR) 10 MG tablet TAKE 1 TABLET BY MOUTH  DAILY 03/30/22   Corky Downs, MD  Cholecalciferol (VITAMIN D3) 2000 UNITS capsule Take 5,000 Units by mouth daily.     [provider]  erythromycin ophthalmic ointment Place a 1/2 inch ribbon of ointment into the lower eyelid. 06/08/23   White, Elita Boone, NP  fluticasone-salmeterol (ADVAIR) 250-50 MCG/ACT AEPB INHALE 1 INHALATION BY MOUTH  INTO THE LUNGS IN THE MORNING  AND AT BEDTIME 11/14/22   Corky Downs, MD  furosemide (LASIX) 20 MG tablet Take 1 tablet (20 mg total) by mouth daily. 02/02/23   Debbe Odea, MD  hydrOXYzine (VISTARIL) 25 MG capsule Take 1 capsule (25 mg total) by mouth at bedtime. anxiety 03/21/23   Allegra Grana, FNP  ipratropium-albuterol (  DUONEB) 0.5-2.5 (3) MG/3ML SOLN Take 3 mLs by nebulization See admin instructions. 12/13/22   Katha Cabal, DO  Multiple Vitamin (MULTI-VITAMINS) TABS Take by mouth. Reported on 06/13/2016    [provider]  omeprazole (PRILOSEC) 20 MG capsule Take 20 mg by mouth daily.    [provider]  ondansetron (ZOFRAN-ODT) 4 MG disintegrating tablet Take 1 tablet (4 mg total) by mouth every 8 (eight) hours as needed for nausea or vomiting. 06/22/23   Radford Pax, NP  pregabalin (LYRICA) 150 MG capsule Take 1 capsule (150 mg total) by mouth 2 (two) times daily. 06/09/23 07/09/23  Menshew, Charlesetta Ivory, PA-C  triamcinolone ointment (KENALOG) 0.1 % Apply 1 Application topically 2 (two)  times daily. 06/09/23   Menshew, Charlesetta Ivory, PA-C  valACYclovir (VALTREX) 1000 MG tablet Take 1 tablet (1,000 mg total) by mouth 3 (three) times daily for 14 days. 06/22/23 07/06/23  Radford Pax, NP  vitamin B-12 (CYANOCOBALAMIN) 1000 MCG tablet Take by mouth.    [provider]    Family History Family History  Problem Relation Age of Onset   Liver cancer Mother    Arthritis Mother    Gastric cancer Father    Asthma Father    Diabetes Sister    Diabetes Brother    Breast cancer Maternal Aunt    Breast cancer Cousin     Social History Social History   Tobacco Use   Smoking status: Never    Passive exposure: Past   Smokeless tobacco: Never  Vaping Use   Vaping status: Never Used  Substance Use Topics   Alcohol use: Yes    Comment: occ. wine   Drug use: No     Allergies   Sulfa antibiotics   Review of Systems Review of Systems  Skin:  Positive for rash.     Physical Exam Triage Vital Signs ED Triage Vitals  Encounter Vitals Group     BP 06/22/23 1257 115/65     Systolic BP Percentile --      Diastolic BP Percentile --      Pulse Rate 06/22/23 1257 76     Resp 06/22/23 1257 16     Temp 06/22/23 1257 98.4 F (36.9 C)     Temp Source 06/22/23 1257 Temporal     SpO2 06/22/23 1257 97 %     Weight --      Height --      Head Circumference --      Peak Flow --      Pain Score 06/22/23 1255 6     Pain Loc --      Pain Education --      Exclude from Growth Chart --    No data found.  Updated Vital Signs BP 115/65 (BP Location: Left Arm)   Pulse 76   Temp 98.4 F (36.9 C) (Temporal)   Resp 16   SpO2 97%   Visual Acuity Right Eye Distance:   Left Eye Distance:   Bilateral Distance:    Right Eye Near:   Left Eye Near:    Bilateral Near:     Physical Exam Vitals and nursing note reviewed.  Constitutional:      General: She is not in acute distress.    Appearance: Normal appearance. She is not ill-appearing.  HENT:     Head:  Normocephalic and atraumatic.  Eyes:     Pupils: Pupils are equal, round, and reactive to light.  Cardiovascular:  Rate and Rhythm: Normal rate.  Pulmonary:     Effort: Pulmonary effort is normal.  Skin:    General: Skin is warm and dry.     Comments: Crusting vesicular rash on left side of face and forehead.   Neurological:     General: No focal deficit present.     Mental Status: She is alert and oriented to person, place, and time.  Psychiatric:        Mood and Affect: Mood normal.        Behavior: Behavior normal.      UC Treatments / Results  Labs (all labs ordered are listed, but only abnormal results are displayed) Labs Reviewed - No data to display  EKG   Radiology No results found.  Procedures Procedures (including critical care time)  Medications Ordered in UC Medications - No data to display  Initial Impression / Assessment and Plan / UC Course  I have reviewed the triage vital signs and the nursing notes.  Pertinent labs & imaging results that were available during my care of the patient were reviewed by me and considered in my medical decision making (see chart for details).     Refilled zofran and valtrex. Pt to see ophthalmology today at scheduled appt. PCP follow up in 2 days for re-check. ER precautions reviewed and pt verbalized understanding.  Final Clinical Impressions(s) / UC Diagnoses   Final diagnoses:  Herpes zoster with complication     Discharge Instructions      I have refilled your valtrex and zofran. Follow up with your PCP if your symptoms are not improving. Please go to the ER for any worsening symptoms. I hope you feel better soon!   ED Prescriptions     Medication Sig Dispense Auth. Provider   ondansetron (ZOFRAN-ODT) 4 MG disintegrating tablet Take 1 tablet (4 mg total) by mouth every 8 (eight) hours as needed for nausea or vomiting. 15 tablet Radford Pax, NP   valACYclovir (VALTREX) 1000 MG tablet Take 1 tablet  (1,000 mg total) by mouth 3 (three) times daily for 14 days. 42 tablet Radford Pax, NP      PDMP not reviewed this encounter.   Radford Pax, NP 06/22/23 1320

## 2023-06-22 NOTE — ED Triage Notes (Signed)
Patient presents to UC for shingles rash x 1 month. Seen at [facility] for shingles and again 07/06 in the Ed. Treated with valtrex, eye drops, Lyrica, and prednisone 07/06. States she still has rash on left side of face and burning sensation. Called PCP for valtrex refill instructed to come here for evaluation.

## 2023-06-22 NOTE — Discharge Instructions (Signed)
I have refilled your valtrex and zofran. Follow up with your PCP if your symptoms are not improving. Please go to the ER for any worsening symptoms. I hope you feel better soon!

## 2023-06-25 ENCOUNTER — Other Ambulatory Visit: Payer: Self-pay

## 2023-06-25 DIAGNOSIS — Z889 Allergy status to unspecified drugs, medicaments and biological substances status: Secondary | ICD-10-CM | POA: Insufficient documentation

## 2023-06-25 DIAGNOSIS — E785 Hyperlipidemia, unspecified: Secondary | ICD-10-CM | POA: Insufficient documentation

## 2023-06-25 DIAGNOSIS — K635 Polyp of colon: Secondary | ICD-10-CM | POA: Insufficient documentation

## 2023-06-25 DIAGNOSIS — D649 Anemia, unspecified: Secondary | ICD-10-CM | POA: Insufficient documentation

## 2023-06-25 DIAGNOSIS — J45909 Unspecified asthma, uncomplicated: Secondary | ICD-10-CM | POA: Insufficient documentation

## 2023-06-25 DIAGNOSIS — Z886 Allergy status to analgesic agent status: Secondary | ICD-10-CM | POA: Insufficient documentation

## 2023-06-25 DIAGNOSIS — A048 Other specified bacterial intestinal infections: Secondary | ICD-10-CM | POA: Insufficient documentation

## 2023-06-26 ENCOUNTER — Encounter: Payer: Self-pay | Admitting: Physician Assistant

## 2023-06-26 ENCOUNTER — Ambulatory Visit (INDEPENDENT_AMBULATORY_CARE_PROVIDER_SITE_OTHER): Payer: Medicare Other | Admitting: Physician Assistant

## 2023-06-26 VITALS — BP 116/70 | HR 76 | Temp 97.8°F | Ht 63.0 in | Wt 124.0 lb

## 2023-06-26 DIAGNOSIS — K219 Gastro-esophageal reflux disease without esophagitis: Secondary | ICD-10-CM

## 2023-06-26 DIAGNOSIS — K449 Diaphragmatic hernia without obstruction or gangrene: Secondary | ICD-10-CM

## 2023-06-26 DIAGNOSIS — K5904 Chronic idiopathic constipation: Secondary | ICD-10-CM

## 2023-06-26 NOTE — Progress Notes (Signed)
[name], PA-C [address]  [address]  [place], [place] [address]  Main: [phone]  Fax: [phone]   Gastroenterology Consultation  Referring Provider:     Allegra Grana, FNP Primary Care Physician:  Allegra Grana, FNP Primary Gastroenterologist:  [name], PA-C / Dr. Wyline Mood   Reason for Consultation:     GERD, Constipation        HPI:   Daisy Holloway is a 89 y.o. y/o female referred for consultation & management  by Allegra Grana, FNP.    Has history of COPD and asthma.  Here to evaluate acid reflux.  History of large hiatal hernia.  Has been on Prilosec 20 Mg daily for many years.  Recently switched to pantoprazole.  She took pantoprazole 40 Mg daily for 2 weeks which caused a lot of abdominal pain.  Dose was decreased to 20 mg daily for 2 weeks which also caused abdominal pain and she discontinued.  Unable to tolerate pantoprazole.  She has most recently switched back to taking omeprazole 20 mg twice daily with benefit.   Abdominal x-ray 05/24/2023 showed stool in the majority of the colon, consistent with constipation.  She has history of chronic constipation.  She states she drinks prune juice which helps.  Her PCP recommended MiraLAX, but she has not been taking that.  She has had a lot of health issues since January 2024 including pneumonia and shingles on her face.  Not eating or exercising regularly.  EGD 09/2014 showed gastritis, normal esophagus and duodenum.  Colonoscopy 09/2012, for iron deficiency anemia, showed sigmoid diverticulosis, otherwise normal.  Previous CT in 2016 showed large hiatal hernia, previous cholecystectomy.    Past Medical History:  Diagnosis Date   Anemia    IDA   Arthritis    Asthma    GERD (gastroesophageal reflux disease)    Iron deficiency anemia due to chronic blood loss 04/01/2015   Osteoporosis     Past Surgical History:  Procedure Laterality Date   ABDOMINAL HYSTERECTOMY  1964   ovaries  left in   BREAST BIOPSY Right 1998   patient states calcifications-benign   CATARACT EXTRACTION     one eye in 2007 and the other eye 2012   CHOLECYSTECTOMY     KNEE SURGERY Right 2004   KNEE SURGERY Left 2012   SHOULDER SURGERY  1995    Prior to Admission medications   Medication Sig Start Date End Date Taking? Authorizing Provider  albuterol (VENTOLIN HFA) 108 (90 Base) MCG/ACT inhaler USE 2 INHALATIONS BY MOUTH  INTO THE LUNGS 4 TIMES  DAILY AS NEEDED 03/26/23   Allegra Grana, FNP  aspirin EC 81 MG tablet Take 81 mg by mouth daily.    [provider]  atorvastatin (LIPITOR) 10 MG tablet TAKE 1 TABLET BY MOUTH  DAILY 03/30/22   Corky Downs, MD  Cholecalciferol (VITAMIN D3) 2000 UNITS capsule Take 5,000 Units by mouth daily.     [provider]  erythromycin ophthalmic ointment Place a 1/2 inch ribbon of ointment into the lower eyelid. 06/08/23   White, Elita Boone, NP  fluticasone-salmeterol (ADVAIR) 250-50 MCG/ACT AEPB INHALE 1 INHALATION BY MOUTH  INTO THE LUNGS IN THE MORNING  AND AT BEDTIME 11/14/22   Corky Downs, MD  furosemide (LASIX) 20 MG tablet Take 1 tablet (20 mg total) by mouth daily. 02/02/23   Debbe Odea, MD  hydrOXYzine (VISTARIL) 25 MG capsule Take 1 capsule (25 mg total) by  mouth at bedtime. anxiety 03/21/23   Allegra Grana, FNP  ipratropium-albuterol (DUONEB) 0.5-2.5 (3) MG/3ML SOLN Take 3 mLs by nebulization See admin instructions. 12/13/22   Katha Cabal, DO  Multiple Vitamin (MULTI-VITAMINS) TABS Take by mouth. Reported on 06/13/2016    [provider]  omeprazole (PRILOSEC) 20 MG capsule Take 20 mg by mouth daily.    [provider]  ondansetron (ZOFRAN-ODT) 4 MG disintegrating tablet Take 1 tablet (4 mg total) by mouth every 8 (eight) hours as needed for nausea or vomiting. 06/22/23   Radford Pax, NP  pregabalin (LYRICA) 150 MG capsule Take 1 capsule (150 mg total) by mouth 2 (two) times daily. 06/09/23 07/09/23   Menshew, Charlesetta Ivory, PA-C  traMADol (ULTRAM) 50 MG tablet Take 50 mg by mouth every 6 (six) hours as needed. 06/08/23   [provider]  triamcinolone ointment (KENALOG) 0.1 % Apply 1 Application topically 2 (two) times daily. 06/09/23   Menshew, Charlesetta Ivory, PA-C  valACYclovir (VALTREX) 1000 MG tablet Take 1 tablet (1,000 mg total) by mouth 3 (three) times daily for 14 days. 06/22/23 07/06/23  Radford Pax, NP  vitamin B-12 (CYANOCOBALAMIN) 1000 MCG tablet Take by mouth.    [provider]    Family History  Problem Relation Age of Onset   Liver cancer Mother    Arthritis Mother    Gastric cancer Father    Asthma Father    Diabetes Sister    Diabetes Brother    Breast cancer Maternal Aunt    Breast cancer Cousin      Social History   Tobacco Use   Smoking status: Never    Passive exposure: Past   Smokeless tobacco: Never  Vaping Use   Vaping status: Never Used  Substance Use Topics   Alcohol use: Yes    Comment: occ. wine   Drug use: No    Allergies as of 06/26/2023 - Review Complete 06/26/2023  Allergen Reaction Noted   Sulfa antibiotics Hives 03/30/2015    Review of Systems:    All systems reviewed and negative except where noted in HPI.   Physical Exam:  BP 116/70   Pulse 76   Temp 97.8 F (36.6 C)   Ht 5\' 3"  (1.6 m)   Wt 124 lb (56.2 kg)   BMI 21.97 kg/m  No LMP recorded. Patient has had a hysterectomy. Psych:  Alert and cooperative. Normal mood and affect. General:   Feeble, elderly, frail female, pleasant and cooperative in NAD Head:  Normocephalic and atraumatic.  She has rash on the left side of her face which has been covered with make-up. Lungs:  Respirations even and unlabored.  Clear throughout to auscultation.   No wheezes, crackles, or rhonchi. No acute distress. Heart:  Regular rate and rhythm; no murmurs, clicks, rubs, or gallops. Abdomen:  Normal bowel sounds.  No bruits.  Soft, and non-distended without masses,  hepatosplenomegaly or hernias noted.  No Tenderness.  No guarding or rebound tenderness.    Neurologic:  Alert and oriented x3;  grossly normal neurologically. Psych:  Alert and cooperative. Normal mood and affect.  Imaging Studies: No results found.  Assessment and Plan:   Daisy Holloway is a 89 y.o. y/o female has been referred for:  1.  Chronic GERD and large hiatal hernia  Continue omeprazole 20 Mg twice daily.  Add OTC Pepcid 20 mg twice daily if needed.  Recommend Lifestyle Modifications to prevent Acid Reflux.  Rec. Avoid  coffee, sodas, peppermint, citrus fruits, and spicey foods.  Avoid eating 2-3 hours before bedtime.   2.  Chronic constipation  Drink prune juice.  Eat high-fiber diet with fruits and vegetables.   Drink 64 ounces water daily. I advised her to go ahead and start MiraLAX, mix half to 1 capful and a drink once daily. If MiraLAX does not work, try Senokot 1 or 2 tablets once daily.  Follow up 6 weeks with TG.  [name], PA-C

## 2023-06-29 ENCOUNTER — Ambulatory Visit (INDEPENDENT_AMBULATORY_CARE_PROVIDER_SITE_OTHER): Payer: Medicare Other | Admitting: Family

## 2023-06-29 ENCOUNTER — Telehealth: Payer: Self-pay | Admitting: Family

## 2023-06-29 ENCOUNTER — Encounter: Payer: Self-pay | Admitting: Family

## 2023-06-29 VITALS — BP 112/78 | HR 78 | Temp 97.8°F | Ht 63.0 in | Wt 125.0 lb

## 2023-06-29 DIAGNOSIS — B028 Zoster with other complications: Secondary | ICD-10-CM | POA: Diagnosis not present

## 2023-06-29 DIAGNOSIS — B0230 Zoster ocular disease, unspecified: Secondary | ICD-10-CM | POA: Diagnosis not present

## 2023-06-29 MED ORDER — PREGABALIN 150 MG PO CAPS
150.0000 mg | ORAL_CAPSULE | Freq: Two times a day (BID) | ORAL | 2 refills | Status: DC
Start: 2023-06-29 — End: 2023-11-15

## 2023-06-29 MED ORDER — TRIAMCINOLONE ACETONIDE 0.1 % EX OINT
1.0000 | TOPICAL_OINTMENT | Freq: Two times a day (BID) | CUTANEOUS | 1 refills | Status: DC
Start: 2023-06-29 — End: 2024-03-25

## 2023-06-29 MED ORDER — VALACYCLOVIR HCL 1 G PO TABS
1000.0000 mg | ORAL_TABLET | Freq: Three times a day (TID) | ORAL | 0 refills | Status: AC
Start: 2023-06-29 — End: 2023-07-13

## 2023-06-29 NOTE — Assessment & Plan Note (Addendum)
Reviewed previous visit including urgent care, emergency room.  Unable to see notes from ophthalmology office, Dr. Jeraldine Loots.  Patient is currently on third round of valacyclovir 1 g 3 times daily  Consult dermatology   Decrease valacyclovir  to suppression dose of 500 mg or 1 g once daily; consider nortriptyline

## 2023-06-29 NOTE — Telephone Encounter (Signed)
Call [facility] dr Jeraldine Loots We need OV notes from last 8 weeks

## 2023-06-29 NOTE — Progress Notes (Unsigned)
Assessment & Plan:  Herpes zoster with complication -     Ambulatory referral to Dermatology -     Pregabalin; Take 1 capsule (150 mg total) by mouth 2 (two) times daily.  Dispense: 60 capsule; Refill: 2 -     valACYclovir HCl; Take 1 tablet (1,000 mg total) by mouth 3 (three) times daily for 14 days.  Dispense: 42 tablet; Refill: 0 -     Triamcinolone Acetonide; Apply 1 Application topically 2 (two) times daily.  Dispense: 30 g; Refill: 1  Herpes zoster with ophthalmic complication, unspecified herpes zoster eye disease Assessment & Plan: Reviewed previous visit including urgent care, emergency room.  Unable to see notes from ophthalmology office, Dr. Jeraldine Loots.  Patient is currently on third round of valacyclovir 1 g 3 times daily  Consult dermatology   Decrease valacyclovir  to suppression dose of 500 mg or 1 g once daily; consider nortriptyline      Return precautions given.   Risks, benefits, and alternatives of the medications and treatment plan prescribed today were discussed, and patient expressed understanding.   Education regarding symptom management and diagnosis given to patient on AVS either electronically or printed.  No follow-ups on file.  Rennie Plowman, FNP  Subjective:    Patient ID: Daisy Holloway, female    DOB: 02-09-1930, 89 y.o.   MRN: [id number]  CC: Daisy Holloway is a 89 y.o. female who presents today for follow up.   HPI: Accompanied by dauhgter Painful left sided rash around left scalp, eye and left cheek improved. No new lesions.   Lesions in the mouth.   No right sided lesions.  She is using calamine lotion, vaseline with improvement  No vision loss, HA,   Started Valtrex 1000mg  TID x 14 days on 06/22/23. She has      Seen initially 05/22/2023 for suspected herpes zoster with ophthalmic complication  Started on Valtrex 1 g 3 times daily for 7 days.  Seen by Dr. Jeraldine Loots at [facility] that same day per notes ( unable to see  notes in Epic) , per notes, follow-up scheduled 06/20/23    Presented to urgent care 06/08/2023 and prescribed erythromycin for lower eyelid, oxycodone 5 mg, valacyclovir 1 g 3 times daily for 14 days  Presented to the emergency room 06/09/2023 for suspected herpes zoster complications.  She had limited benefit with oxycodone.  Prescribe Zofran, prednisone taper, triamcinolone ointment and pregabalin  Patient presented to [facility] urgent care 06/22/2023.  She primarily wanted a refill of Zofran and Valtrex; refilled 06/22/2023   No history of CKD Allergies: Sulfa antibiotics and Pantoprazole sodium Current Outpatient Medications on File Prior to Visit  Medication Sig Dispense Refill   albuterol (VENTOLIN HFA) 108 (90 Base) MCG/ACT inhaler USE 2 INHALATIONS BY MOUTH  INTO THE LUNGS 4 TIMES  DAILY AS NEEDED 34 g 3   aspirin EC 81 MG tablet Take 81 mg by mouth daily.     atorvastatin (LIPITOR) 10 MG tablet TAKE 1 TABLET BY MOUTH  DAILY 90 tablet 3   Cholecalciferol (VITAMIN D3) 2000 UNITS capsule Take 5,000 Units by mouth daily.      erythromycin ophthalmic ointment Place a 1/2 inch ribbon of ointment into the lower eyelid. 3.5 g 0   fluticasone-salmeterol (ADVAIR) 250-50 MCG/ACT AEPB INHALE 1 INHALATION BY MOUTH  INTO THE LUNGS IN THE MORNING  AND AT BEDTIME 200 each 1   furosemide (LASIX) 20 MG tablet Take 1 tablet (20 mg total) by  mouth daily. 90 tablet 3   hydrOXYzine (VISTARIL) 25 MG capsule Take 1 capsule (25 mg total) by mouth at bedtime. anxiety 90 capsule 1   ipratropium-albuterol (DUONEB) 0.5-2.5 (3) MG/3ML SOLN Take 3 mLs by nebulization See admin instructions. 360 mL 8   Multiple Vitamin (MULTI-VITAMINS) TABS Take by mouth. Reported on 06/13/2016     omeprazole (PRILOSEC) 20 MG capsule Take 20 mg by mouth daily.     ondansetron (ZOFRAN-ODT) 4 MG disintegrating tablet Take 1 tablet (4 mg total) by mouth every 8 (eight) hours as needed for nausea or vomiting. 15 tablet 0   traMADol (ULTRAM) 50 MG  tablet Take 50 mg by mouth every 6 (six) hours as needed.     vitamin B-12 (CYANOCOBALAMIN) 1000 MCG tablet Take by mouth.     No current facility-administered medications on file prior to visit.    Review of Systems  Constitutional:  Negative for chills and fever.  Respiratory:  Negative for cough.   Cardiovascular:  Negative for chest pain and palpitations.  Gastrointestinal:  Negative for nausea and vomiting.      Objective:    BP 112/78   Pulse 78   Temp 97.8 F (36.6 C) (Oral)   Ht 5\' 3"  (1.6 m)   Wt 125 lb (56.7 kg)   SpO2 96%   BMI 22.14 kg/m  BP Readings from Last 3 Encounters:  06/29/23 112/78  06/26/23 116/70  06/22/23 115/65   Wt Readings from Last 3 Encounters:  06/29/23 125 lb (56.7 kg)  06/26/23 124 lb (56.2 kg)  06/09/23 121 lb 4.1 oz (55 kg)    Physical Exam Vitals reviewed.  Constitutional:      Appearance: She is well-developed.  HENT:     Head: Normocephalic and atraumatic.     Right Ear: Hearing, tympanic membrane, ear canal and external ear normal. No decreased hearing noted. No drainage, swelling or tenderness. No middle ear effusion. No foreign body. Tympanic membrane is not erythematous or bulging.     Left Ear: Hearing, tympanic membrane, ear canal and external ear normal. No decreased hearing noted. No drainage, swelling or tenderness.  No middle ear effusion. No foreign body. Tympanic membrane is not erythematous or bulging.     Nose: Nose normal. No rhinorrhea.     Right Sinus: No maxillary sinus tenderness or frontal sinus tenderness.     Left Sinus: No maxillary sinus tenderness or frontal sinus tenderness.     Mouth/Throat:     Pharynx: Uvula midline. No oropharyngeal exudate or posterior oropharyngeal erythema.     Tonsils: No tonsillar abscesses.  Eyes:     Conjunctiva/sclera: Conjunctivae normal.  Cardiovascular:     Rate and Rhythm: Regular rhythm.     Pulses: Normal pulses.     Heart sounds: Normal heart sounds.  Pulmonary:      Effort: Pulmonary effort is normal.     Breath sounds: Normal breath sounds. No wheezing, rhonchi or rales.  Lymphadenopathy:     Head:     Right side of head: No submental, submandibular, tonsillar, preauricular, posterior auricular or occipital adenopathy.     Left side of head: No submental, submandibular, tonsillar, preauricular, posterior auricular or occipital adenopathy.     Cervical: No cervical adenopathy.  Skin:    General: Skin is warm and dry.  Neurological:     Mental Status: She is alert.  Psychiatric:        Speech: Speech normal.        Behavior:  Behavior normal.        Thought Content: Thought content normal.

## 2023-07-02 ENCOUNTER — Encounter: Payer: Self-pay | Admitting: Cardiology

## 2023-07-02 ENCOUNTER — Ambulatory Visit: Payer: Medicare Other | Attending: Cardiology | Admitting: Cardiology

## 2023-07-02 VITALS — BP 108/56 | HR 75 | Ht 63.0 in | Wt 124.8 lb

## 2023-07-02 DIAGNOSIS — R0602 Shortness of breath: Secondary | ICD-10-CM | POA: Diagnosis not present

## 2023-07-02 DIAGNOSIS — I350 Nonrheumatic aortic (valve) stenosis: Secondary | ICD-10-CM

## 2023-07-02 MED ORDER — FUROSEMIDE 20 MG PO TABS: 20.0000 mg | ORAL_TABLET | ORAL | Status: DC | PRN

## 2023-07-02 NOTE — Patient Instructions (Signed)

## 2023-07-02 NOTE — Patient Instructions (Signed)
I have refilled triamcinolone ointment and also Lyrica   I have also refilled valacyclovir and reached out to dermatology in regards to continued valacyclovir, how long in which dose.   Please let me know how you are doing

## 2023-07-02 NOTE — Progress Notes (Signed)
Cardiology Office Note:    Date:  07/02/2023   ID:  Daisy Holloway, DOB 03/30/1930, MRN [id number]  PCP:  Allegra Grana, FNP   [facility] Providers Cardiologist:  Debbe Odea, MD     Referring MD: Allegra Grana, FNP   Chief Complaint  Patient presents with   Follow-up    Patient cancelled Myoview test and has not been rescheduled because she does not want to have this test.  Currently being treated for shingles that was diagnosed on 05/22/23.      History of Present Illness:    Daisy Holloway is a 89 y.o. 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    Previously seen due to symptoms of shortness of breath, Lexiscan was ordered but patient declined stating she felt scared.  Overall doing okay, had a recent shingle attack on her left face.  Currently taking Valtrex with good effect.  Has dependent edema, also with shortness of breath which is chronic.  Prior notes Echo 7/23 from [facility] EF over 55%, impaired relaxation, moderate aortic stenosis.  Past Medical History:  Diagnosis Date   Anemia    IDA   Arthritis    Asthma    GERD (gastroesophageal reflux disease)    Iron deficiency anemia due to chronic blood loss 04/01/2015   Osteoporosis     Past Surgical History:  Procedure Laterality Date   ABDOMINAL HYSTERECTOMY  1964   ovaries left in   BREAST BIOPSY Right 1998   patient states calcifications-benign   CATARACT EXTRACTION     one eye in 2007 and the other eye 2012   CHOLECYSTECTOMY     KNEE SURGERY Right 2004   KNEE SURGERY Left 2012   SHOULDER SURGERY  1995    Current Medications: Current Meds  Medication Sig   albuterol (VENTOLIN HFA) 108 (90 Base) MCG/ACT inhaler USE 2 INHALATIONS BY MOUTH  INTO THE LUNGS 4 TIMES  DAILY AS NEEDED   aspirin EC 81 MG tablet Take 81 mg by mouth daily.    atorvastatin (LIPITOR) 10 MG tablet TAKE 1 TABLET BY MOUTH  DAILY   Cholecalciferol (VITAMIN D3) 2000 UNITS capsule Take 5,000 Units by mouth daily.    erythromycin ophthalmic ointment Place a 1/2 inch ribbon of ointment into the lower eyelid.   fluticasone-salmeterol (ADVAIR) 250-50 MCG/ACT AEPB INHALE 1 INHALATION BY MOUTH  INTO THE LUNGS IN THE MORNING  AND AT BEDTIME   hydrOXYzine (VISTARIL) 25 MG capsule Take 1 capsule (25 mg total) by mouth at bedtime. anxiety   ipratropium-albuterol (DUONEB) 0.5-2.5 (3) MG/3ML SOLN Take 3 mLs by nebulization See admin instructions.   Multiple Vitamin (MULTI-VITAMINS) TABS Take by mouth. Reported on 06/13/2016   omeprazole (PRILOSEC) 20 MG capsule Take 20 mg by mouth daily.   ondansetron (ZOFRAN-ODT) 4 MG disintegrating tablet Take 1 tablet (4 mg total) by mouth every 8 (eight) hours as needed for nausea or vomiting.   pregabalin (LYRICA) 150 MG capsule Take 1 capsule (150 mg total) by mouth 2 (two) times daily.   traMADol (ULTRAM) 50 MG tablet Take 50 mg by mouth every 6 (six) hours as needed.   triamcinolone ointment (KENALOG) 0.1 % Apply 1 Application topically 2 (two) times daily.   valACYclovir (VALTREX) 1000 MG tablet Take 1 tablet (1,000 mg total) by mouth 3 (three) times daily for 14 days.  vitamin B-12 (CYANOCOBALAMIN) 1000 MCG tablet Take by mouth.   [DISCONTINUED] furosemide (LASIX) 20 MG tablet Take 1 tablet (20 mg total) by mouth daily.     Allergies:   Sulfa antibiotics and Pantoprazole sodium   Social History   Socioeconomic History   Marital status: Married    Spouse name: Not on file   Number of children: Not on file   Years of education: Not on file   Highest education level: Not on file  Occupational History   Not on file  Tobacco Use   Smoking status: Never    Passive exposure: Past   Smokeless tobacco: Never  Vaping Use   Vaping status: Never Used  Substance and Sexual Activity   Alcohol use: Yes    Comment: occ. wine    Drug use: No   Sexual activity: Not on file  Other Topics Concern   Not on file  Social History Narrative   Not on file   Social Determinants of Health   Financial Resource Strain: Low Risk  (03/08/2023)   Overall Financial Resource Strain (CARDIA)    Difficulty of Paying Living Expenses: Not very hard  Food Insecurity: No Food Insecurity (06/12/2023)   Hunger Vital Sign    Worried About Running Out of Food in the Last Year: Never true    Ran Out of Food in the Last Year: Never true  Transportation Needs: No Transportation Needs (06/12/2023)   PRAPARE - Administrator, Civil Service (Medical): No    Lack of Transportation (Non-Medical): No  Physical Activity: Unknown (03/08/2023)   Exercise Vital Sign    Days of Exercise per Week: 0 days    Minutes of Exercise per Session: Not on file  Stress: No Stress Concern Present (03/08/2023)   [employer] of Occupational Health - Occupational Stress Questionnaire    Feeling of Stress : Not at all  Social Connections: Moderately Isolated (03/08/2023)   Social Connection and Isolation Panel [NHANES]    Frequency of Communication with Friends and Family: More than three times a week    Frequency of Social Gatherings with Friends and Family: More than three times a week    Attends Religious Services: 1 to 4 times per year    Active Member of [employer] or Organizations: No    Attends Banker Meetings: Never    Marital Status: Widowed     Family History: The patient's family history includes Arthritis in her mother; Asthma in her father; Breast cancer in her cousin and maternal aunt; Diabetes in her brother and sister; Gastric cancer in her father; Liver cancer in her mother.  ROS:   Please see the history of present illness.     All other systems reviewed and are negative.  EKGs/Labs/Other Studies Reviewed:    The following studies were reviewed today:   EKG:  EKG not ordered today.    Recent Labs: 12/13/2022: B  Natriuretic Peptide 215.1 02/14/2023: ALT 13; BUN 14; Creatinine, Ser 0.70; Potassium 4.2; Sodium 143 05/14/2023: Hemoglobin 12.9; Platelets 168.0  Recent Lipid Panel    Component Value Date/Time   CHOL 171 02/14/2023 1040   TRIG 69.0 02/14/2023 1040   HDL 90.80 02/14/2023 1040   CHOLHDL 2 02/14/2023 1040   VLDL 13.8 02/14/2023 1040   LDLCALC 66 02/14/2023 1040     Risk Assessment/Calculations:             Physical Exam:    VS:  BP (!) 108/56 (BP Location:  Left Arm, Patient Position: Sitting, Cuff Size: Normal)   Pulse 75   Ht 5\' 3"  (1.6 m)   Wt 124 lb 12.8 oz (56.6 kg)   SpO2 98%   BMI 22.11 kg/m     Wt Readings from Last 3 Encounters:  07/02/23 124 lb 12.8 oz (56.6 kg)  06/29/23 125 lb (56.7 kg)  06/26/23 124 lb (56.2 kg)     GEN:  Well nourished, well developed in no acute distress HEENT: Normal NECK: No JVD; No carotid bruits CARDIAC: RRR, 3/6 systolic murmur RESPIRATORY: Diminished breath sound bilaterally, no wheezing. ABDOMEN: Soft, non-tender, non-distended MUSCULOSKELETAL:  No edema; varicose veins. SKIN: Warm and dry NEUROLOGIC:  Alert and oriented x 3 PSYCHIATRIC:  Normal affect   ASSESSMENT:    1. SOB (shortness of breath)   2. Aortic valve stenosis, etiology of cardiac valve disease unspecified    PLAN:    In order of problems listed above:  Shortness of breath, never smoker.  Echo with normal systolic function, EF 50 to 55%, impaired relaxation.  Patient declined Myoview.  Pulmonary etiology likely culprit. Mild to moderate aortic stenosis.  Echo 03/2023 EF 50 to 55%.  Unchanged from prior in 2023.  Continue to monitor with serial echocardiograms. Dependent edema, varicose veins. compression stockings, keeping legs raised while in seated position advised.  Okay for as needed Lasix.  Follow-up in 6 months.     Medication Adjustments/Labs and Tests Ordered: Current medicines are reviewed at length with the patient today.  Concerns regarding  medicines are outlined above.  No orders of the defined types were placed in this encounter.  Meds ordered this encounter  Medications   furosemide (LASIX) 20 MG tablet    Sig: Take 1 tablet (20 mg total) by mouth as needed.    Patient Instructions  Medication Instructions:   Your physician recommends that you continue on your current medications as directed. Please refer to the Current Medication list given to you today.  *If you need a refill on your cardiac medications before your next appointment, please call your pharmacy*   Lab Work:  None Ordered  If you have labs (blood work) drawn today and your tests are completely normal, you will receive your results only by: MyChart Message (if you have MyChart) OR A paper copy in the mail If you have any lab test that is abnormal or we need to change your treatment, we will call you to review the results.   Testing/Procedures:  None Ordered   Follow-Up: At [facility], you and your health needs are our priority.  As part of our continuing mission to provide you with exceptional heart care, we have created designated Provider Care Teams.  These Care Teams include your primary Cardiologist (physician) and Advanced Practice Providers (APPs -  Physician Assistants and Nurse Practitioners) who all work together to provide you with the care you need, when you need it.  We recommend signing up for the patient portal called "MyChart".  Sign up information is provided on this After Visit Summary.  MyChart is used to connect with patients for Virtual Visits (Telemedicine).  Patients are able to view lab/test results, encounter notes, upcoming appointments, etc.  Non-urgent messages can be sent to your provider as well.   To learn more about what you can do with MyChart, go to [email or web address].    Your next appointment:   6 month(s)  Provider:   You may see Debbe Odea, MD or one of the following  Advanced Practice  Providers on your designated Care Team:   Nicolasa Ducking, NP Eula Listen, PA-C Cadence Fransico Michael, PA-C Charlsie Quest, NP   Signed, Debbe Odea, MD  07/02/2023 3:27 PM    [facility]

## 2023-07-06 ENCOUNTER — Telehealth: Payer: Self-pay

## 2023-07-06 NOTE — Telephone Encounter (Signed)
E fax has been sent on today requesting these OV notes

## 2023-07-06 NOTE — Telephone Encounter (Signed)
Provider requested OV notes from the last 8 weeks from [facility] an e fax has been sent requesting those notes

## 2023-07-10 ENCOUNTER — Telehealth: Payer: Self-pay

## 2023-07-10 NOTE — Telephone Encounter (Signed)
Pt  daughter Jeanice Lim, called in to state that Mom has COVID and wanted  to have something called in to the pharmacy.  Preferably Plaxlovid.  I told daughter that she had to have appt she scheduled virtual for tomorrow with Dr Clent Ridges.

## 2023-07-11 ENCOUNTER — Telehealth: Payer: Self-pay | Admitting: Family

## 2023-07-11 ENCOUNTER — Ambulatory Visit (INDEPENDENT_AMBULATORY_CARE_PROVIDER_SITE_OTHER): Payer: Medicare Other | Admitting: Family Medicine

## 2023-07-11 ENCOUNTER — Encounter: Payer: Self-pay | Admitting: Family Medicine

## 2023-07-11 VITALS — Ht 63.0 in | Wt 124.0 lb

## 2023-07-11 DIAGNOSIS — U071 COVID-19: Secondary | ICD-10-CM

## 2023-07-11 MED ORDER — MOLNUPIRAVIR EUA 200MG CAPSULE: 4.0000 | ORAL_CAPSULE | Freq: Two times a day (BID) | ORAL | 0 refills | Status: AC

## 2023-07-11 NOTE — Telephone Encounter (Signed)
Call patient I wanted to follow-up in regards to shingles.  I did consult with dermatology,Dr [facility]  We discussed placing patient on suppressive dose of Valtrex versus the high dose for recurrence  To do this she would simply decrease Valtrex to 1000 mg daily.  She is currently on 1000 mg 3 times daily.  Please let me know and change Valtrex on chart if she is agreed  Please sch f/u appt with me in 6-8 weeks

## 2023-07-11 NOTE — Patient Instructions (Signed)
It was a pleasure meeting you today. Thank you for allowing me to take part in your health care.  Our goals for today as we discussed include:  Continue current medications  Schedule an appointment for you Medicare annual wellness  Referral sent for Mammogram. Please call to schedule appointment. [facility] [address] [place], [place] [address] [phone]   If you have any questions or concerns, please do not hesitate to call the office at [phone].  I look forward to our next visit and until then take care and stay safe.  Regards,   Marlen Mollica, MD   [place]  

## 2023-07-11 NOTE — Progress Notes (Addendum)
Virtual Visit via Telephone Note  I connected with Daisy Holloway on 07/28/23 at 1224 by telephone and verified that I am speaking with the correct person using two identifiers. Daisy Holloway is currently located at home and is currently with alone during this visit. The provider, Dana Allan, MD, is located in their office at time of visit.  I discussed the limitations, risks, security and privacy concerns of performing an evaluation and management service by telephone and the availability of in person appointments. I also discussed with the patient that there may be a patient responsible charge related to this service. The patient expressed understanding and agreed to proceed.  Subjective: PCP: Allegra Grana, FNP  Chief Complaint  Patient presents with   Covid Positive    Feels like a bad cold tested + yesterday   Fatigue    Symptoms started on Monday. Fatigue, scratchy throat, mild cough, little chills.  Denies any fevers, shortness of breath, difficulty breathing, wheezing, chest pain, increased heart rate, nausea, vomiting, diarrhea, abdominal pain, or urinary symptoms.  Appetite remains good and hydrating well.  Took COVID test yesterday and positive.  History COPD/Asthma.  O2 dependent but has not had to increase flow.  Would like to start antiviral therapy.  ROS: Per HPI  Current Outpatient Medications:    albuterol (VENTOLIN HFA) 108 (90 Base) MCG/ACT inhaler, USE 2 INHALATIONS BY MOUTH  INTO THE LUNGS 4 TIMES  DAILY AS NEEDED, Disp: 34 g, Rfl: 3   aspirin EC 81 MG tablet, Take 81 mg by mouth daily., Disp: , Rfl:    atorvastatin (LIPITOR) 10 MG tablet, TAKE 1 TABLET BY MOUTH  DAILY, Disp: 90 tablet, Rfl: 3   Cholecalciferol (VITAMIN D3) 2000 UNITS capsule, Take 5,000 Units by mouth daily. , Disp: , Rfl:    erythromycin ophthalmic ointment, Place a 1/2 inch ribbon of ointment into the lower eyelid., Disp: 3.5 g, Rfl: 0   fluticasone-salmeterol (ADVAIR) 250-50 MCG/ACT  AEPB, INHALE 1 INHALATION BY MOUTH  INTO THE LUNGS IN THE MORNING  AND AT BEDTIME, Disp: 200 each, Rfl: 1   furosemide (LASIX) 20 MG tablet, Take 1 tablet (20 mg total) by mouth as needed., Disp: , Rfl:    hydrOXYzine (VISTARIL) 25 MG capsule, Take 1 capsule (25 mg total) by mouth at bedtime. anxiety, Disp: 90 capsule, Rfl: 1   ipratropium-albuterol (DUONEB) 0.5-2.5 (3) MG/3ML SOLN, Take 3 mLs by nebulization See admin instructions., Disp: 360 mL, Rfl: 8   Multiple Vitamin (MULTI-VITAMINS) TABS, Take by mouth. Reported on 06/13/2016, Disp: , Rfl:    omeprazole (PRILOSEC) 20 MG capsule, Take 20 mg by mouth daily., Disp: , Rfl:    ondansetron (ZOFRAN-ODT) 4 MG disintegrating tablet, Take 1 tablet (4 mg total) by mouth every 8 (eight) hours as needed for nausea or vomiting., Disp: 15 tablet, Rfl: 0   pregabalin (LYRICA) 150 MG capsule, Take 1 capsule (150 mg total) by mouth 2 (two) times daily., Disp: 60 capsule, Rfl: 2   traMADol (ULTRAM) 50 MG tablet, Take 50 mg by mouth every 6 (six) hours as needed., Disp: , Rfl:    triamcinolone ointment (KENALOG) 0.1 %, Apply 1 Application topically 2 (two) times daily., Disp: 30 g, Rfl: 1   vitamin B-12 (CYANOCOBALAMIN) 1000 MCG tablet, Take by mouth., Disp: , Rfl:   Observations/Objective: A&O  No respiratory distress or wheezing audible over the phone Mood, judgement, and thought processes all WNL  Assessment and Plan: Positive self-administered antigen test for  COVID-19 Assessment & Plan: 3 days of symptoms.   Positive home COVID test In no acute respiratory distress or having cough over phone.  Exam limited. Recommend patient follow up in clinic tomorrow.  Scheduled for 340 pm Start Molnupiravir 200 mg 4 capsules BID x 5 days Strict ED precautions provided COVID precautions provided  Orders: -     molnupiravir EUA; Take 4 capsules (800 mg total) by mouth 2 (two) times daily for 5 days.  Dispense: 40 capsule; Refill: 0    Follow Up  Instructions: Return in about 1 day (around 07/12/2023).  I discussed the assessment and treatment plan with the patient. The patient was provided an opportunity to ask questions and all were answered. The patient agreed with the plan and demonstrated an understanding of the instructions.   The patient was advised to call back or seek an in-person evaluation if the symptoms worsen or if the condition fails to improve as anticipated.  The above assessment and management plan was discussed with the patient. The patient verbalized understanding of and has agreed to the management plan. Patient is aware to call the clinic if symptoms persist or worsen. Patient is aware when to return to the clinic for a follow-up visit. Patient educated on when it is appropriate to go to the emergency department.  Total of 30 minutes spent with patient during telephone visit.  Dana Allan, MD

## 2023-07-11 NOTE — Telephone Encounter (Signed)
Seen dr Clent Ridges today

## 2023-07-11 NOTE — Assessment & Plan Note (Signed)
3 days of symptoms.   Positive home COVID test In no acute respiratory distress or having cough over phone.  Exam limited. Recommend patient follow up in clinic tomorrow.  Scheduled for 340 pm Start Molnupiravir 200 mg 4 capsules BID x 5 days Strict ED precautions provided COVID precautions provided

## 2023-07-12 ENCOUNTER — Telehealth: Payer: Medicare Other | Admitting: Family Medicine

## 2023-07-12 NOTE — Telephone Encounter (Signed)
Spoke to pt Daughter Jeanice Lim and relayed message below to her she stated that they will try the 1000 mg once a day instead of 3 x's daily. Jeanice Lim stated that Mom still has the blisters also and theyhave an appt with Dr Clent Ridges today as well. Will schedule f/up after appt today

## 2023-07-13 ENCOUNTER — Telehealth: Payer: Self-pay | Admitting: Family

## 2023-07-13 NOTE — Telephone Encounter (Signed)
Pt not seen 07/12/23

## 2023-07-13 NOTE — Telephone Encounter (Signed)
Daisy Holloway, what is status of dermatology referral? [name]  Please schedule follow-up with me , preferably next week  Please how she is feeling with covid Ensure NO CP, SOB

## 2023-07-13 NOTE — Telephone Encounter (Signed)
Spoke to pt daughter and she stated that Mom is not having any CP, Chest tightness , or sob just feels like she has a head cold, and that they picked up medication and they noticed that it was expired and went back to Pharmacy  to inform them it was expired and Pharmacist told them the [place] government says use it anyway. Mom stated that she will not use because it is expired. Daughter stated that  she will call back and schedule a f/up with you for Mom

## 2023-07-17 NOTE — Telephone Encounter (Signed)
noted 

## 2023-07-22 DIAGNOSIS — R0600 Dyspnea, unspecified: Secondary | ICD-10-CM | POA: Diagnosis not present

## 2023-07-24 ENCOUNTER — Telehealth: Payer: Self-pay | Admitting: Family

## 2023-07-24 NOTE — Telephone Encounter (Signed)
Spoke to pt daughter Jeanice Lim and relayed message below  to her Per Dr Clent Ridges.Marland Kitchen  Marland KitchenCurbside consult with ID, Dr Rivka Safer regarding continuing prophylactic Valtrex. She reviewed pics from initial diagnosis until last seen.  Given that much improved did not think further treatment needed with Valtrex.   she can complete current prescription. Follow up with PCP  she verbalized understanding and also stated that Mom needs a refill on her Lyrica also she only has a few pills left.

## 2023-07-24 NOTE — Telephone Encounter (Signed)
Patient just called and said she needs her medication refilled for shingles. The medication is called Valacyclovir. She states she only has a few pills left. The pharmacy she uses is [facility]) - [place],  - [address] [facility] [address] [place] [address], [place]  [address] Phone: [phone]  Fax: [phone]  Her number is [phone]

## 2023-07-24 NOTE — Telephone Encounter (Signed)
Curbside consult with ID, Dr Rivka Safer regarding continuing prophylactic Valtrex. She reviewed pics from initial diagnosis until last seen.  Given that much improved did not think further treatment needed with Valtrex.  CMA to notify patient and she can complete current prescription. Follow up with PCP  Dana Allan, MD

## 2023-07-28 ENCOUNTER — Encounter: Payer: Self-pay | Admitting: Family Medicine

## 2023-08-02 ENCOUNTER — Encounter: Payer: Self-pay | Admitting: Dermatology

## 2023-08-02 ENCOUNTER — Ambulatory Visit: Payer: Medicare Other | Admitting: Dermatology

## 2023-08-02 DIAGNOSIS — L570 Actinic keratosis: Secondary | ICD-10-CM

## 2023-08-02 DIAGNOSIS — L821 Other seborrheic keratosis: Secondary | ICD-10-CM | POA: Diagnosis not present

## 2023-08-02 DIAGNOSIS — B0229 Other postherpetic nervous system involvement: Secondary | ICD-10-CM | POA: Diagnosis not present

## 2023-08-02 NOTE — Patient Instructions (Signed)

## 2023-08-02 NOTE — Progress Notes (Signed)
   NEW PATIENT    Subjective  Daisy Holloway is a 89 y.o. female who presents for the following: Patient c/o shingles on the left side of her face started June 18, she has taken Valtrex tablets 3 different times, using otc Calamine lotion with a good response, tried Triamcinolone ointment no help so she stopped Triamcinolone ointment, her eye doctor prescribed Erythromycin ointment for left lower eyelid-little help, taking Pregabalin tablets for pain- helping  The patient has spots, moles and lesions to be evaluated, some may be new or changing and the patient may have concern these could be cancer.   Daughter Daisy Holloway is with patient and contributes to history.   The following portions of the chart were reviewed this encounter and updated as appropriate: medications, allergies, medical history  Review of Systems:  No other skin or systemic complaints except as noted in HPI or Assessment and Plan.  Objective  Well appearing patient in no apparent distress; mood and affect are within normal limits.  A focused examination was performed of the following areas:face   Relevant exam findings are noted in the Assessment and Plan.  left side of face No visible active zoster lesions on previously affected area  right cheek Erythematous thin papules/macules with gritty scale.     Assessment & Plan   SEBORRHEIC KERATOSIS face - Stuck-on, waxy, tan-brown papules and/or plaques  - Benign-appearing - Discussed benign etiology and prognosis. - Observe - Call for any changes    Postherpetic neuralgia left side of face  Discussed pathogenesis of herpes zoster. Lesions have resolved with treatment. Continued symptoms of pruritus/burning/pain are secondary to nerve injury and are neuropathic in nature. - agree with topical agents for soothing such as calamine lotion as needed - Agree with pregabalin if helpful and not causing side effects - defer to ophthalmology on eye drops  AK  (actinic keratosis) right cheek  Patient will return for treatment later  Actinic keratoses are precancerous spots that appear secondary to cumulative UV radiation exposure/sun exposure over time. They are chronic with expected duration over 1 year. A portion of actinic keratoses will progress to squamous cell carcinoma of the skin. It is not possible to reliably predict which spots will progress to skin cancer and so treatment is recommended to prevent development of skin cancer.  Recommend daily broad spectrum sunscreen SPF 30+ to sun-exposed areas, reapply every 2 hours as needed.  Recommend staying in the shade or wearing long sleeves, sun glasses (UVA+UVB protection) and wide brim hats (4-inch brim around the entire circumference of the hat). Call for new or changing lesions.   Patient decline treatment today she will come back another day to treat  Seborrheic keratoses    Return if symptoms worsen or fail to improve.  IAngelique Holm, CMA, am acting as scribe for Elie Goody, MD .   Documentation: I have reviewed the above documentation for accuracy and completeness, and I agree with the above.  Elie Goody, MD

## 2023-08-07 ENCOUNTER — Ambulatory Visit: Payer: Medicare Other | Admitting: Dermatology

## 2023-08-07 NOTE — Addendum Note (Signed)
Addended by: Enid Cutter on: 08/07/2023 04:24 PM   Modules accepted: Level of Service

## 2023-08-08 ENCOUNTER — Ambulatory Visit: Payer: Medicare Other | Admitting: Physician Assistant

## 2023-08-08 NOTE — Progress Notes (Deleted)
[name], PA-C [address]  [address]  [place], [place] [address]  Main: [phone]  Fax: [phone]   Primary Care Physician: Allegra Grana, FNP  Primary Gastroenterologist:  [name], PA-C / Dr. Wyline Mood    CC: Follow-up GERD, hiatal hernia, constipation  HPI: Daisy Holloway is a 89 y.o. female returns for 6-week follow-up of GERD, large hiatal hernia, and constipation.  She could not tolerate pantoprazole which caused abdominal pain.  Currently taking omeprazole 20 Mg twice daily and Pepcid 20 Mg twice daily as needed.  Avoiding GERD trigger foods.  Was recommended to take MiraLAX half to 1 capful daily for constipation.  Senokot as needed.  High-fiber diet, 64 ounces of water daily, and prune juice as needed.  Abdominal x-ray 05/2023 showed stool throughout the colon consistent with constipation.  EGD 09/2014 showed gastritis, normal esophagus and duodenum.   Colonoscopy 09/2012, for iron deficiency anemia, showed sigmoid diverticulosis, otherwise normal.   Previous CT in 2016 showed large hiatal hernia, previous cholecystectomy.  Current Outpatient Medications  Medication Sig Dispense Refill   albuterol (VENTOLIN HFA) 108 (90 Base) MCG/ACT inhaler USE 2 INHALATIONS BY MOUTH  INTO THE LUNGS 4 TIMES  DAILY AS NEEDED 34 g 3   aspirin EC 81 MG tablet Take 81 mg by mouth daily.     atorvastatin (LIPITOR) 10 MG tablet TAKE 1 TABLET BY MOUTH  DAILY 90 tablet 3   Cholecalciferol (VITAMIN D3) 2000 UNITS capsule Take 5,000 Units by mouth daily.      erythromycin ophthalmic ointment Place a 1/2 inch ribbon of ointment into the lower eyelid. 3.5 g 0   fluticasone-salmeterol (ADVAIR) 250-50 MCG/ACT AEPB INHALE 1 INHALATION BY MOUTH  INTO THE LUNGS IN THE MORNING  AND AT BEDTIME 200 each 1   furosemide (LASIX) 20 MG tablet Take 1 tablet (20 mg total) by mouth as needed.     hydrOXYzine (VISTARIL) 25 MG capsule Take 1 capsule (25 mg total) by mouth at bedtime.  anxiety 90 capsule 1   ipratropium-albuterol (DUONEB) 0.5-2.5 (3) MG/3ML SOLN Take 3 mLs by nebulization See admin instructions. 360 mL 8   Multiple Vitamin (MULTI-VITAMINS) TABS Take by mouth. Reported on 06/13/2016     omeprazole (PRILOSEC) 20 MG capsule Take 20 mg by mouth daily.     ondansetron (ZOFRAN-ODT) 4 MG disintegrating tablet Take 1 tablet (4 mg total) by mouth every 8 (eight) hours as needed for nausea or vomiting. 15 tablet 0   pregabalin (LYRICA) 150 MG capsule Take 1 capsule (150 mg total) by mouth 2 (two) times daily. 60 capsule 2   traMADol (ULTRAM) 50 MG tablet Take 50 mg by mouth every 6 (six) hours as needed.     triamcinolone ointment (KENALOG) 0.1 % Apply 1 Application topically 2 (two) times daily. 30 g 1   vitamin B-12 (CYANOCOBALAMIN) 1000 MCG tablet Take by mouth.     No current facility-administered medications for this visit.    Allergies as of 08/08/2023 - Review Complete 08/02/2023  Allergen Reaction Noted   Sulfa antibiotics Hives 03/30/2015   Pantoprazole sodium  06/26/2023    Past Medical History:  Diagnosis Date   Anemia    IDA   Arthritis    Asthma    GERD (gastroesophageal reflux disease)    Iron deficiency anemia due to chronic blood loss 04/01/2015   Osteoporosis     Past Surgical History:  Procedure Laterality Date   ABDOMINAL HYSTERECTOMY  1964  ovaries left in   BREAST BIOPSY Right 1998   patient states calcifications-benign   CATARACT EXTRACTION     one eye in 2007 and the other eye 2012   CHOLECYSTECTOMY     KNEE SURGERY Right 2004   KNEE SURGERY Left 2012   SHOULDER SURGERY  1995    Review of Systems:    All systems reviewed and negative except where noted in HPI.   Physical Examination:   There were no vitals taken for this visit.  General: Well-nourished, well-developed in no acute distress.  Lungs: Clear to auscultation bilaterally. Non-labored. Heart: Regular rate and rhythm, no murmurs rubs or gallops.  Abdomen:  Bowel sounds are normal; Abdomen is Soft; No hepatosplenomegaly, masses or hernias;  No Abdominal Tenderness; No guarding or rebound tenderness. Neuro: Alert and oriented x 3.  Grossly intact.  Psych: Alert and cooperative, normal mood and affect.   Imaging Studies: See HPI.  Assessment and Plan:   Daisy Holloway is a 89 y.o. y/o female returns for follow-up of GERD, large hiatal hernia, and chronic constipation.  1.  GERD /large hiatal hernia  Continue omeprazole 20 Mg twice daily  Continue Pepcid 20 Mg twice daily as needed  Continue avoiding GERD trigger foods  She is not a good surgical candidate to repair hiatal hernia given her advanced age  70.  Chronic constipation  Continue MiraLAX daily and drink prune juice as needed  Continue high-fiber diet and 64 ounces of water daily  Take Senokot as needed   [name], PA-C  Follow up as needed based on GI symptoms  BP check ***

## 2023-08-17 ENCOUNTER — Telehealth: Payer: Self-pay | Admitting: Family

## 2023-08-17 NOTE — Telephone Encounter (Signed)
Pt called stating [facility] need approval from the provider to get her medicine delivered at home

## 2023-08-20 NOTE — Telephone Encounter (Signed)
Pt returning call

## 2023-08-20 NOTE — Telephone Encounter (Signed)
Lvm to call back

## 2023-08-21 NOTE — Telephone Encounter (Signed)
Patient states she is returning a call from Jenate [place], CMA.  I transferred call to [facility].

## 2023-08-22 DIAGNOSIS — R0600 Dyspnea, unspecified: Secondary | ICD-10-CM | POA: Diagnosis not present

## 2023-08-22 NOTE — Telephone Encounter (Signed)
Spoke to pt and added pharmacy to her chart to begin getting her medication through  them

## 2023-08-27 ENCOUNTER — Telehealth: Payer: Self-pay | Admitting: Family

## 2023-08-27 ENCOUNTER — Other Ambulatory Visit: Payer: Self-pay

## 2023-08-27 MED ORDER — FLUTICASONE-SALMETEROL 250-50 MCG/ACT IN AEPB: INHALATION_SPRAY | RESPIRATORY_TRACT | 1 refills | Status: DC

## 2023-08-27 NOTE — Telephone Encounter (Signed)
Patient's daughter just dropped off handicap app. She said it just needs to be signed. Jeanice Lim her daughter will come pick it up when ready. Her number is [phone]. It's in the colorful folder.

## 2023-08-27 NOTE — Telephone Encounter (Signed)
Placed in provider folder for signature

## 2023-09-11 ENCOUNTER — Telehealth: Payer: Self-pay | Admitting: Family

## 2023-09-11 NOTE — Telephone Encounter (Signed)
Call patient I received an [facility] notification that patient is currently on pantoprazole 20 mg.  In the chart it states omeprazole.  Please clarify which medication she is on and dosage, milligram.  Please ensure her chart is accurate

## 2023-09-11 NOTE — Telephone Encounter (Signed)
LVM for pt daughter Jeanice Lim to call back to office so we can get clarification because  I received an [facility] notification that patient is currently on pantoprazole 20 mg. In the chart it states omeprazole. Please clarify which medication she is on and dosage, milligram. Please ensure her chart is accurate

## 2023-09-12 NOTE — Telephone Encounter (Signed)
Patient's daughter, Pete Glatter called back to let [name] know that the omeprazole 20 mg.

## 2023-09-12 NOTE — Telephone Encounter (Signed)
Patient's daughter, Debbora Presto, called to state she is returning a call from Jenate [place], CMA.  I read message from [facility] to patient.  Hollye states patient is taking omeprazole.  Hollye states she is not sure of the dosage, but she will call us back with that information.

## 2023-09-19 ENCOUNTER — Ambulatory Visit (INDEPENDENT_AMBULATORY_CARE_PROVIDER_SITE_OTHER): Payer: Medicare Other

## 2023-09-19 DIAGNOSIS — Z23 Encounter for immunization: Secondary | ICD-10-CM

## 2023-09-19 NOTE — Progress Notes (Signed)
Patient presented for High Dose Flu vaccine to left deltoid, patient voiced no concerns nor showed any signs of distress during injection

## 2023-09-21 DIAGNOSIS — R0600 Dyspnea, unspecified: Secondary | ICD-10-CM | POA: Diagnosis not present

## 2023-09-27 ENCOUNTER — Telehealth: Payer: Self-pay | Admitting: Family

## 2023-09-27 NOTE — Telephone Encounter (Signed)
Pt daughter called stating the pt need a small oxygen tank because she is going out of town

## 2023-09-28 NOTE — Telephone Encounter (Signed)
LVM to have daughter call back

## 2023-09-28 NOTE — Telephone Encounter (Signed)
noted 

## 2023-09-28 NOTE — Telephone Encounter (Signed)
Spoke to [place] pt daughter and informed her that they would need to contact pulmonology to get orders for small  O2 portable.machine Daughter stated that  pulmonologist would not send order because Mom did not pass the fit testing. Daughter would like to discuss at Mom next visit regarding Moms O2

## 2023-10-22 DIAGNOSIS — R0600 Dyspnea, unspecified: Secondary | ICD-10-CM | POA: Diagnosis not present

## 2023-11-13 ENCOUNTER — Telehealth: Payer: Self-pay | Admitting: Family

## 2023-11-13 NOTE — Telephone Encounter (Signed)
LVM for daughter  Daisy Holloway [phone] to call back to office

## 2023-11-13 NOTE — Telephone Encounter (Signed)
Daughter called and wanted to inform provider that her other has not been eating well. She would eat grits in morning and nothing else for the last 3 days. Her mom states she just can't eat. Please call daughter

## 2023-11-14 NOTE — Telephone Encounter (Signed)
Patient daughter called returning a phone call.

## 2023-11-14 NOTE — Telephone Encounter (Signed)
I spoke with daughter today   She had small formed BM yesterday Prior to, she had had 2-3 days without a BM Daughter concerned for fried food affecting stomach.   She is not eating due constipation.   Endorses nausea  She has tried warm prune juice 3 days ago, and started fleet glycerin.   Denies F, Vomiting    Plan: Fleet saline enema Start MiraLAX today  Go to ED to not passing gas, fever, worsening abdominal pain.  Appointment tomorrow with me

## 2023-11-14 NOTE — Telephone Encounter (Signed)
Spoke to daughter mom has not been eating properly has only has small bowel movement since Wednesday of last week she has given her prune juice and laxative nothing has worked thinks she may have a blockage, pt is scheduled for 11/14/23 in office

## 2023-11-15 ENCOUNTER — Ambulatory Visit (INDEPENDENT_AMBULATORY_CARE_PROVIDER_SITE_OTHER): Payer: Medicare Other | Admitting: Family

## 2023-11-15 ENCOUNTER — Encounter: Payer: Self-pay | Admitting: Family

## 2023-11-15 VITALS — BP 118/78 | HR 78 | Temp 98.0°F | Ht 63.0 in | Wt 120.0 lb

## 2023-11-15 DIAGNOSIS — K5900 Constipation, unspecified: Secondary | ICD-10-CM | POA: Diagnosis not present

## 2023-11-15 DIAGNOSIS — K219 Gastro-esophageal reflux disease without esophagitis: Secondary | ICD-10-CM | POA: Diagnosis not present

## 2023-11-15 DIAGNOSIS — B0230 Zoster ocular disease, unspecified: Secondary | ICD-10-CM | POA: Diagnosis not present

## 2023-11-15 MED ORDER — LACTULOSE 10 GM/15ML PO SOLN: 10.0000 g | Freq: Three times a day (TID) | ORAL | 0 refills | Status: AC

## 2023-11-15 MED ORDER — PREGABALIN 25 MG PO CAPS: 25.0000 mg | ORAL_CAPSULE | Freq: Two times a day (BID) | ORAL | 1 refills | Status: DC

## 2023-11-15 MED ORDER — OMEPRAZOLE 20 MG PO CPDR
20.0000 mg | DELAYED_RELEASE_CAPSULE | Freq: Every day | ORAL | 3 refills | Status: DC
Start: 2023-11-15 — End: 2024-01-03

## 2023-11-15 NOTE — Progress Notes (Signed)
Assessment & Plan:  Gastroesophageal reflux disease without esophagitis -     Omeprazole; Take 1 capsule (20 mg total) by mouth daily.  Dispense: 90 capsule; Refill: 3  Constipation, unspecified constipation type Assessment & Plan: Acute on chronic.  Counseled on importance of hydration, fiber, walking.  Provided with lactulose for 5 days and then advised to start MiraLAX to titrate.  She may continue use of prune juice, Fleet enema.  Alarm features discussed with patient and daughter today which would warrant presentation to the emergency room.  Advised to let me know if after constipation resolved if patient's appetite does not return.  Monitor for additional weight loss   Herpes zoster with ophthalmic complication, unspecified herpes zoster eye disease Assessment & Plan: Vesicular lesions have resolved.  Postherpetic neuralgia.  Advised to trial lower dose of Lyrica 25 mg twice daily.  She will let me know if she feels medication is as sedating   Other orders -     Lactulose; Take 15 mLs (10 g total) by mouth 3 (three) times daily for 5 days.  Dispense: 236 mL; Refill: 0 -     Pregabalin; Take 1 capsule (25 mg total) by mouth 2 (two) times daily.  Dispense: 60 capsule; Refill: 1     Return precautions given.   Risks, benefits, and alternatives of the medications and treatment plan prescribed today were discussed, and patient expressed understanding.   Education regarding symptom management and diagnosis given to patient on AVS either electronically or printed.  No follow-ups on file.  Rennie Plowman, FNP  Subjective:    Patient ID: Daisy Holloway, female    DOB: 07/24/1930, 89 y.o.   MRN: [id number]  CC: ARIANNAH Holloway is a 89 y.o. female who presents today for an acute visit.    HPI: Accompanined by daughter, Jeanice Lim Complains of constipation.  She has had constipation for years.  Typically this is well-managed with prune juice  She thinks aggravated by processed foods  last week  She has one soft , small BM 3 days ago and again yesterday  She has been taking prune juice, glycerin suppository and fleet enema daily. She started miralax yesterday.   Appetite is diminished; she had grits and applesauce yesterday   Denies fever, N, V, choking.   She is passing gas.   Request refill of omeprazole  Continues to have left-sided facial soreness after shingles.  Lyrica has been too sedating for her.  She is using calamine lotion with some relief.  No vesicular lesions     History of abdominal hysterectomy, cholecystectomy Allergies: Sulfa antibiotics and Pantoprazole sodium Current Outpatient Medications on File Prior to Visit  Medication Sig Dispense Refill   albuterol (VENTOLIN HFA) 108 (90 Base) MCG/ACT inhaler USE 2 INHALATIONS BY MOUTH  INTO THE LUNGS 4 TIMES  DAILY AS NEEDED 34 g 3   aspirin EC 81 MG tablet Take 81 mg by mouth daily.     atorvastatin (LIPITOR) 10 MG tablet TAKE 1 TABLET BY MOUTH  DAILY 90 tablet 3   Cholecalciferol (VITAMIN D3) 2000 UNITS capsule Take 5,000 Units by mouth daily.      erythromycin ophthalmic ointment Place a 1/2 inch ribbon of ointment into the lower eyelid. 3.5 g 0   fluticasone-salmeterol (ADVAIR) 250-50 MCG/ACT AEPB Inhale 1 inhalation by mouth  into the lungs in the morning and @ bedtime 200 each 1   furosemide (LASIX) 20 MG tablet Take 1 tablet (20 mg total) by mouth as  needed.     hydrOXYzine (VISTARIL) 25 MG capsule Take 1 capsule (25 mg total) by mouth at bedtime. anxiety 90 capsule 1   ipratropium-albuterol (DUONEB) 0.5-2.5 (3) MG/3ML SOLN Take 3 mLs by nebulization See admin instructions. 360 mL 8   Multiple Vitamin (MULTI-VITAMINS) TABS Take by mouth. Reported on 06/13/2016     ondansetron (ZOFRAN-ODT) 4 MG disintegrating tablet Take 1 tablet (4 mg total) by mouth every 8 (eight) hours as needed for nausea or vomiting. 15 tablet 0   traMADol (ULTRAM) 50 MG tablet Take 50 mg by mouth every 6 (six) hours as  needed.     triamcinolone ointment (KENALOG) 0.1 % Apply 1 Application topically 2 (two) times daily. 30 g 1   vitamin B-12 (CYANOCOBALAMIN) 1000 MCG tablet Take by mouth.     No current facility-administered medications on file prior to visit.    Review of Systems  Constitutional:  Positive for appetite change. Negative for chills and fever.  Respiratory:  Negative for cough.   Cardiovascular:  Negative for chest pain and palpitations.  Gastrointestinal:  Positive for constipation. Negative for abdominal pain, blood in stool, nausea and vomiting.      Objective:    BP 118/78   Pulse 78   Temp 98 F (36.7 C) (Oral)   Ht 5\' 3"  (1.6 m)   Wt 120 lb (54.4 kg)   SpO2 96%   BMI 21.26 kg/m   BP Readings from Last 3 Encounters:  11/15/23 118/78  07/02/23 (!) 108/56  06/29/23 112/78   Wt Readings from Last 3 Encounters:  11/15/23 120 lb (54.4 kg)  07/11/23 124 lb (56.2 kg)  07/02/23 124 lb 12.8 oz (56.6 kg)    Physical Exam Vitals reviewed.  Constitutional:      Appearance: Normal appearance. She is well-developed.  Eyes:     Conjunctiva/sclera: Conjunctivae normal.  Cardiovascular:     Rate and Rhythm: Normal rate and regular rhythm.     Pulses: Normal pulses.     Heart sounds: Normal heart sounds.  Pulmonary:     Effort: Pulmonary effort is normal.     Breath sounds: Normal breath sounds. No wheezing, rhonchi or rales.  Abdominal:     General: Bowel sounds are normal. There is no distension.     Palpations: Abdomen is soft. Abdomen is not rigid. There is no fluid wave or mass.     Tenderness: There is no abdominal tenderness. There is no guarding or rebound.  Skin:    General: Skin is warm and dry.  Neurological:     Mental Status: She is alert.  Psychiatric:        Speech: Speech normal.        Behavior: Behavior normal.        Thought Content: Thought content normal.

## 2023-11-15 NOTE — Patient Instructions (Addendum)
For postherpetic neuralgia, trial Lyrica 25 mg twice daily.  Please let me know if overly sedating for you   Escalate bowel regime  Start lactulose 10 g 3 times daily x 5 days Continue Fleet enema daily Start MiraLAX 1 scoop daily on day 6.  Recommend she continue MiraLAX given history of chronic constipation ; you may need to titrate intake a couple times a week to keep bowels regular Consider starting Colace daily ongoing to soften stool     Constipation Prevention What is Constipation? Constipation is hard, dry bowel movements or the inability to have a bowel movement.  You can also feel like you need to have a bowel movement but not be able to.  It can also be painful when you strain to have a bowel movement.  Taking narcotic pain medicine after surgery can make you constipated, even if you have never had a problem with constipation. What Do I Need To Do? The best thing to do for constipation is to keep it from happening.  This can be done by: Adding laxatives to your daily routine, when taking prescription pain medicines after surgery. Add 17 gm Miralax daily or 100 mg Colace once or twice daily. (Miralax is mixed in water. Colace is a pill). They soften your bowel movements to make them easier to pass and hurt less. Drink plenty of water to help flush your bowels.  (Eight, 8 ounce glasses daily) Eat foods high in fiber such as whole grains, vegetables, cereals, fruits, and prune juice (5-7 servings a day or 25 grams).  If you do not know how much fiber a food has in it you can look on the label under "dietary fiber."  If you have trouble getting enough fiber in your diet you may want to consider a fiber supplement such as Metamucil or Citrucel.  Also, be aware that eating fiber without drinking enough water can make constipation worse. If you do become constipated some medications that may help are: Bisacodyl (Dulcolax) is available in tablet form or a suppository. Glycerin  suppositories are also a good choice if you need a fast acting medication. Everybody is different and may have different results.  Talk to your pharmacist or health care provider about your specific problems. They can help you choose the best product for you.  Why Is It Important for Me To Do This? Being constipated is not something you have to live with.  There are many things you can do to help.   Feeling bad can interfere with your recovery after surgery.  If constipation goes on for too long it can become a very serious medical problem. You may need to visit your doctor or go to the hospital.  That is why it is very important to drink lots of water, eat enough fiber, and keep it from happening.  Ask Questions We want to answer all of your questions and concerns.  That's why we encourage you to use a program called Ask Me 3T, created by the [employer].  By using Ask Me 3T you are encouraged to ask 3 simple (yet, potentially life saving questions) whenever you are talking with your physician, nurse or pharmacist: What is my main problem? What do I need to do? Why is it important for me to do this? By understanding the answer to these three questions and any other questions you may have, you have the knowledge necessary to manage your health. Please feel very comfortable asking any  questions. Healthcare is complicated, so if you hear an answer you do not understand, please ask your health care team to explain again.   Sources: [employer] 09-22-10 N

## 2023-11-15 NOTE — Assessment & Plan Note (Signed)
Acute on chronic.  Counseled on importance of hydration, fiber, walking.  Provided with lactulose for 5 days and then advised to start MiraLAX to titrate.  She may continue use of prune juice, Fleet enema.  Alarm features discussed with patient and daughter today which would warrant presentation to the emergency room.  Advised to let me know if after constipation resolved if patient's appetite does not return.  Monitor for additional weight loss

## 2023-11-15 NOTE — Assessment & Plan Note (Signed)
Vesicular lesions have resolved.  Postherpetic neuralgia.  Advised to trial lower dose of Lyrica 25 mg twice daily.  She will let me know if she feels medication is as sedating

## 2023-11-21 DIAGNOSIS — R0600 Dyspnea, unspecified: Secondary | ICD-10-CM | POA: Diagnosis not present

## 2023-12-13 ENCOUNTER — Ambulatory Visit: Payer: Medicare Other | Admitting: Family

## 2023-12-22 DIAGNOSIS — R0600 Dyspnea, unspecified: Secondary | ICD-10-CM | POA: Diagnosis not present

## 2024-01-03 ENCOUNTER — Telehealth: Payer: Self-pay

## 2024-01-03 DIAGNOSIS — K219 Gastro-esophageal reflux disease without esophagitis: Secondary | ICD-10-CM

## 2024-01-03 MED ORDER — OMEPRAZOLE 20 MG PO CPDR
20.0000 mg | DELAYED_RELEASE_CAPSULE | Freq: Two times a day (BID) | ORAL | 3 refills | Status: DC
Start: 2024-01-03 — End: 2024-09-08

## 2024-01-03 NOTE — Telephone Encounter (Signed)
[facility] to make [facility] aware of new rx  Let pt know as well; I have sent

## 2024-01-03 NOTE — Addendum Note (Signed)
Addended by: Allegra Grana on: 01/03/2024 03:54 PM   Modules accepted: Orders

## 2024-01-03 NOTE — Telephone Encounter (Signed)
Copied from CRM [id number]. Topic: Clinical - Prescription Issue >> Jan 03, 2024 12:00 PM Adele Barthel wrote: Reason for CRM: Patient reports omeprazole (PRILOSEC) 20 MG capsule refill was incorrect when sent to the [employer] [facility]. The label says for her to take 1 tablet once a day; however, she reports she has always taken 1 tablet twice a day. She just noticed the issue when looking at the pill bottle and is worried she will run out since she has been taking it twice a day. She requests a call to [facility] to fix the error with the prescription so that she does not run out of medication. CB# [phone]

## 2024-01-03 NOTE — Telephone Encounter (Signed)
Called and spoke to pt daughter to inform her that the medication has been sent for the 2 times a day instead of the 1 capsule daily.

## 2024-01-22 DIAGNOSIS — R0600 Dyspnea, unspecified: Secondary | ICD-10-CM | POA: Diagnosis not present

## 2024-02-06 ENCOUNTER — Other Ambulatory Visit: Payer: Self-pay | Admitting: *Deleted

## 2024-02-06 MED ORDER — FUROSEMIDE 20 MG PO TABS: 20.0000 mg | ORAL_TABLET | ORAL | 0 refills | Status: AC | PRN

## 2024-02-06 NOTE — Telephone Encounter (Signed)
 Left voice mail

## 2024-02-07 NOTE — Telephone Encounter (Signed)
 Appointment scheduled for 03/21/24

## 2024-02-19 DIAGNOSIS — R0600 Dyspnea, unspecified: Secondary | ICD-10-CM | POA: Diagnosis not present

## 2024-03-11 NOTE — Progress Notes (Signed)
 This encounter was created in error - please disregard.

## 2024-03-19 ENCOUNTER — Other Ambulatory Visit: Payer: Self-pay | Admitting: Family

## 2024-03-19 DIAGNOSIS — B028 Zoster with other complications: Secondary | ICD-10-CM

## 2024-03-19 NOTE — Telephone Encounter (Signed)
 Copied from CRM [id number]. Topic: Clinical - Medication Refill >> Mar 19, 2024 11:17 AM Daisy Holloway wrote: Most Recent Primary Care Visit:  Provider: Michaelle Adolphus  Department: [facility]  Visit Type: MEDICARE AWV, SEQUENTIAL  Date: 03/11/2024  Medication: triamcinolone ointment (KENALOG) 0.1 %  Has the patient contacted their pharmacy? Yes (Agent: If no, request that the patient contact the pharmacy for the refill. If patient does not wish to contact the pharmacy document the reason why and proceed with request.) (Agent: If yes, when and what did the pharmacy advise?)  Is this the correct pharmacy for this prescription? Yes If no, delete pharmacy and type the correct one.  This is the patient's preferred pharmacy:  [facility] Store ID: [id number]  [address], [place], [place] [address]   Has the prescription been filled recently? Yes  Is the patient out of the medication? Yes  Has the patient been seen for an appointment in the last year OR does the patient have an upcoming appointment? Yes  Can we respond through MyChart? Yes  Agent: Please be advised that Rx refills may take up to 3 business days. We ask that you follow-up with your pharmacy.

## 2024-03-21 ENCOUNTER — Encounter: Payer: Self-pay | Admitting: Cardiology

## 2024-03-21 ENCOUNTER — Ambulatory Visit: Attending: Cardiology | Admitting: Cardiology

## 2024-03-21 VITALS — BP 116/54 | HR 73 | Ht 63.0 in | Wt 125.2 lb

## 2024-03-21 DIAGNOSIS — I350 Nonrheumatic aortic (valve) stenosis: Secondary | ICD-10-CM

## 2024-03-21 DIAGNOSIS — I493 Ventricular premature depolarization: Secondary | ICD-10-CM

## 2024-03-21 DIAGNOSIS — R0602 Shortness of breath: Secondary | ICD-10-CM | POA: Diagnosis not present

## 2024-03-21 DIAGNOSIS — R0600 Dyspnea, unspecified: Secondary | ICD-10-CM | POA: Diagnosis not present

## 2024-03-21 NOTE — Patient Instructions (Signed)
 Medication Instructions:  The current medical regimen is effective;  continue present plan and medications as directed. Please refer to the Current Medication list given to you today.   *If you need a refill on your cardiac medications before your next appointment, please call your pharmacy*   Testing/Procedures: Your physician has requested that you have an echocardiogram. Echocardiography is a painless test that uses sound waves to create images of your heart. It provides your doctor with information about the size and shape of your heart and how well your heart's chambers and valves are working.   You may receive an ultrasound enhancing agent through an IV if needed to better visualize your heart during the echo. This procedure takes approximately one hour.  There are no restrictions for this procedure.  This will take place at [address] [facility] [address]) [address], [place] [address]  Please note: We ask at that you not bring children with you during ultrasound (echo/ vascular) testing. Due to room size and safety concerns, children are not allowed in the ultrasound rooms during exams. Our front office staff cannot provide observation of children in our lobby area while testing is being conducted. An adult accompanying a patient to their appointment will only be allowed in the ultrasound room at the discretion of the ultrasound technician under special circumstances. We apologize for any inconvenience.   Follow-Up: At [facility], you and your health needs are our priority.  As part of our continuing mission to provide you with exceptional heart care, our providers are all part of one team.  This team includes your primary Cardiologist (physician) and Advanced Practice Providers or APPs (Physician Assistants and Nurse Practitioners) who all work together to provide you with the care you need, when you need it.  Your next appointment:   12 month(s)  Provider:   You may  see Constancia Delton, MD or one of the following Advanced Practice Providers on your designated Care Team:   Laneta Pintos, NP Gildardo Labrador, PA-C Varney Gentleman, PA-C Cadence Calhoun, PA-C Ronald Cockayne, NP Morey Ar, NP    We recommend signing up for the patient portal called "MyChart".  Sign up information is provided on this After Visit Summary.  MyChart is used to connect with patients for Virtual Visits (Telemedicine).  Patients are able to view lab/test results, encounter notes, upcoming appointments, etc.  Non-urgent messages can be sent to your provider as well.   To learn more about what you can do with MyChart, go to [email or web address].

## 2024-03-21 NOTE — Progress Notes (Signed)
 Cardiology Office Note:    Date:  03/21/2024   ID:  Daisy Holloway, DOB Oct 28, 1930, MRN [id number]  PCP:  Calista Catching, FNP   [facility] Providers Cardiologist:  Constancia Delton, MD     Referring MD: Calista Catching, FNP   Chief Complaint  Patient presents with   Follow-up    Patient states that she experiences shortness of breath. Meds reviewed.    History of Present Illness:    Daisy Holloway is a 89 y.o. 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    Doing okay, denies chest pain, endorses shortness of breath with exertion which is chronic.  Denies syncope or dizziness.  Complaining of seasonal allergies secondary to pollen.  Currently recovering from a shingle infection involving the left face.  Prior notes Echo 03/2023 EF 50 to 55%, mild to moderate aortic valve stenosis. Echo 7/23 from [facility] EF over 55%, impaired relaxation, moderate aortic stenosis.  Past Medical History:  Diagnosis Date   Anemia    IDA   Arthritis    Asthma    GERD (gastroesophageal reflux disease)    Iron  deficiency anemia due to chronic blood loss 04/01/2015   Osteoporosis     Past Surgical History:  Procedure Laterality Date   ABDOMINAL HYSTERECTOMY  1964   ovaries left in   BREAST BIOPSY Right 1998   patient states calcifications-benign   CATARACT EXTRACTION     one eye in 2007 and the other eye 2012   CHOLECYSTECTOMY     KNEE SURGERY Right 2004   KNEE SURGERY Left 2012   SHOULDER SURGERY  1995    Current Medications: Current Meds  Medication Sig   albuterol  (VENTOLIN  HFA) 108 (90 Base) MCG/ACT inhaler USE 2 INHALATIONS BY MOUTH  INTO THE LUNGS 4 TIMES  DAILY AS NEEDED   aspirin EC 81 MG tablet Take 81 mg by mouth daily.   atorvastatin  (LIPITOR) 10 MG tablet TAKE 1 TABLET BY MOUTH  DAILY   Cholecalciferol (VITAMIN D3)  2000 UNITS capsule Take 5,000 Units by mouth daily.    erythromycin  ophthalmic ointment Place a 1/2 inch ribbon of ointment into the lower eyelid.   fluticasone -salmeterol (ADVAIR) 250-50 MCG/ACT AEPB Inhale 1 inhalation by mouth  into the lungs in the morning and @ bedtime   furosemide  (LASIX ) 20 MG tablet Take 1 tablet (20 mg total) by mouth as needed.   ipratropium-albuterol  (DUONEB) 0.5-2.5 (3) MG/3ML SOLN Take 3 mLs by nebulization See admin instructions.   Multiple Vitamin (MULTI-VITAMINS) TABS Take by mouth. Reported on 06/13/2016   omeprazole  (PRILOSEC) 20 MG capsule Take 1 capsule (20 mg total) by mouth 2 (two) times daily.   ondansetron  (ZOFRAN -ODT) 4 MG disintegrating tablet Take 1 tablet (4 mg total) by mouth every 8 (eight) hours as needed for nausea or vomiting.   pregabalin  (LYRICA ) 25 MG capsule Take 1 capsule (25 mg total) by mouth 2 (two) times daily. (Patient taking differently: Take 25 mg by mouth daily.)   traMADol  (ULTRAM ) 50 MG tablet Take 50 mg by mouth every 6 (six) hours as needed.   triamcinolone  ointment (KENALOG ) 0.1 % Apply 1 Application topically 2 (two) times daily.   vitamin B-12 (CYANOCOBALAMIN ) 1000 MCG tablet Take by mouth.     Allergies:   Sulfa antibiotics and Pantoprazole  sodium   Social History   Socioeconomic History  Marital status: Married    Spouse name: Not on file   Number of children: Not on file   Years of education: Not on file   Highest education level: Not on file  Occupational History   Not on file  Tobacco Use   Smoking status: Never    Passive exposure: Past   Smokeless tobacco: Never  Vaping Use   Vaping status: Never Used  Substance and Sexual Activity   Alcohol use: Yes    Comment: occ. wine   Drug use: No   Sexual activity: Not on file  Other Topics Concern   Not on file  Social History Narrative   Not on file   Social Drivers of Health   Financial Resource Strain: Low Risk  (03/08/2023)   Overall Financial Resource  Strain (CARDIA)    Difficulty of Paying Living Expenses: Not very hard  Food Insecurity: No Food Insecurity (06/12/2023)   Hunger Vital Sign    Worried About Running Out of Food in the Last Year: Never true    Ran Out of Food in the Last Year: Never true  Transportation Needs: No Transportation Needs (06/12/2023)   PRAPARE - Administrator, Civil Service (Medical): No    Lack of Transportation (Non-Medical): No  Physical Activity: Unknown (03/08/2023)   Exercise Vital Sign    Days of Exercise per Week: 0 days    Minutes of Exercise per Session: Not on file  Stress: No Stress Concern Present (03/08/2023)   [employer] of Occupational Health - Occupational Stress Questionnaire    Feeling of Stress : Not at all  Social Connections: Moderately Isolated (03/08/2023)   Social Connection and Isolation Panel [NHANES]    Frequency of Communication with Friends and Family: More than three times a week    Frequency of Social Gatherings with Friends and Family: More than three times a week    Attends Religious Services: 1 to 4 times per year    Active Member of [employer] or Organizations: No    Attends Banker Meetings: Never    Marital Status: Widowed     Family History: The patient's family history includes Arthritis in her mother; Asthma in her father; Breast cancer in her cousin and maternal aunt; Diabetes in her brother and sister; Gastric cancer in her father; Liver cancer in her mother.  ROS:   Please see the history of present illness.     All other systems reviewed and are negative.  EKGs/Labs/Other Studies Reviewed:    The following studies were reviewed today:  EKG Interpretation Date/Time:  Friday March 21 2024 14:01:31 EDT Ventricular Rate:  73 PR Interval:  172 QRS Duration:  92 QT Interval:  372 QTC Calculation: 409 R Axis:   -19  Text Interpretation: Sinus rhythm with occasional Premature ventricular complexes Left ventricular hypertrophy with  repolarization abnormality ( Cornell product ) When compared with ECG of 21-Mar-2024 13:59, Premature ventricular complexes are now Present Confirmed by Constancia Delton ([id number]) on 03/21/2024 2:10:47 PM    Recent Labs: 05/14/2023: Hemoglobin 12.9; Platelets 168.0  Recent Lipid Panel    Component Value Date/Time   CHOL 171 02/14/2023 1040   TRIG 69.0 02/14/2023 1040   HDL 90.80 02/14/2023 1040   CHOLHDL 2 02/14/2023 1040   VLDL 13.8 02/14/2023 1040   LDLCALC 66 02/14/2023 1040     Risk Assessment/Calculations:             Physical Exam:    VS:  BP Aaron Aas)  116/54   Pulse 73   Ht 5\' 3"  (1.6 m)   Wt 125 lb 3.2 oz (56.8 kg)   SpO2 95%   BMI 22.18 kg/m     Wt Readings from Last 3 Encounters:  03/21/24 125 lb 3.2 oz (56.8 kg)  11/15/23 120 lb (54.4 kg)  07/11/23 124 lb (56.2 kg)     GEN:  Well nourished, well developed in no acute distress HEENT: Normal NECK: No JVD; No carotid bruits CARDIAC: RRR, 3/6 systolic murmur RESPIRATORY: Diminished breath sound bilaterally, no wheezing. ABDOMEN: Soft, non-tender, non-distended MUSCULOSKELETAL:  No edema; varicose veins. SKIN: Warm and dry NEUROLOGIC:  Alert and oriented x 3 PSYCHIATRIC:  Normal affect   ASSESSMENT:    1. SOB (shortness of breath)   2. Aortic valve stenosis, etiology of cardiac valve disease unspecified    PLAN:    In order of problems listed above:  Shortness of breath, never smoker.  Echo with normal systolic function, EF 50 to 55%, impaired relaxation.  Previously declined Myoview.  Pulmonary etiology likely culprit. Mild to moderate aortic stenosis.  Echo 03/2023 EF 50 to 55%.  Repeat echo in 1 year.  Follow-up in 12 months after repeat echo.     Medication Adjustments/Labs and Tests Ordered: Current medicines are reviewed at length with the patient today.  Concerns regarding medicines are outlined above.  Orders Placed This Encounter  Procedures   EKG 12-Lead   EKG 12-Lead   ECHOCARDIOGRAM  COMPLETE   No orders of the defined types were placed in this encounter.   Patient Instructions  Medication Instructions:  The current medical regimen is effective;  continue present plan and medications as directed. Please refer to the Current Medication list given to you today.   *If you need a refill on your cardiac medications before your next appointment, please call your pharmacy*   Testing/Procedures: Your physician has requested that you have an echocardiogram. Echocardiography is a painless test that uses sound waves to create images of your heart. It provides your doctor with information about the size and shape of your heart and how well your heart's chambers and valves are working.   You may receive an ultrasound enhancing agent through an IV if needed to better visualize your heart during the echo. This procedure takes approximately one hour.  There are no restrictions for this procedure.  This will take place at [address] [facility] [address], [place] [address]  Please note: We ask at that you not bring children with you during ultrasound (echo/ vascular) testing. Due to room size and safety concerns, children are not allowed in the ultrasound rooms during exams. Our front office staff cannot provide observation of children in our lobby area while testing is being conducted. An adult accompanying a patient to their appointment will only be allowed in the ultrasound room at the discretion of the ultrasound technician under special circumstances. We apologize for any inconvenience.   Follow-Up: At [facility], you and your health needs are our priority.  As part of our continuing mission to provide you with exceptional heart care, our providers are all part of one team.  This team includes your primary Cardiologist (physician) and Advanced Practice Providers or APPs (Physician Assistants and Nurse Practitioners) who all work together to provide you with  the care you need, when you need it.  Your next appointment:   12 month(s)  Provider:   You may see Constancia Delton, MD or one of  the following Advanced Practice Providers on your designated Care Team:   Laneta Pintos, NP Gildardo Labrador, PA-C Varney Gentleman, PA-C Cadence Van Wyck, PA-C Ronald Cockayne, NP Morey Ar, NP    We recommend signing up for the patient portal called "MyChart".  Sign up information is provided on this After Visit Summary.  MyChart is used to connect with patients for Virtual Visits (Telemedicine).  Patients are able to view lab/test results, encounter notes, upcoming appointments, etc.  Non-urgent messages can be sent to your provider as well.   To learn more about what you can do with MyChart, go to [email or web address].         Signed, Constancia Delton, MD  03/21/2024 3:38 PM    [facility]

## 2024-03-24 ENCOUNTER — Telehealth: Payer: Self-pay

## 2024-03-24 DIAGNOSIS — B028 Zoster with other complications: Secondary | ICD-10-CM

## 2024-03-24 NOTE — Telephone Encounter (Signed)
 Copied from CRM [id number]. Topic: Clinical - Medication Refill >> Mar 19, 2024 11:17 AM Keitha Pata L wrote: Most Recent Primary Care Visit:  Provider: Michaelle Adolphus  Department: [facility]  Visit Type: MEDICARE AWV, SEQUENTIAL  Date: 03/11/2024  Medication: triamcinolone  ointment (KENALOG ) 0.1 %  Has the patient contacted their pharmacy? Yes (Agent: If no, request that the patient contact the pharmacy for the refill. If patient does not wish to contact the pharmacy document the reason why and proceed with request.) (Agent: If yes, when and what did the pharmacy advise?)  Is this the correct pharmacy for this prescription? Yes If no, delete pharmacy and type the correct one.  This is the patient's preferred pharmacy:  [facility] Store ID: [id number]  [address], [place], [place] [address]   Has the prescription been filled recently? Yes  Is the patient out of the medication? Yes  Has the patient been seen for an appointment in the last year OR does the patient have an upcoming appointment? Yes  Can we respond through MyChart? Yes  Agent: Please be advised that Rx refills may take up to 3 business days. We ask that you follow-up with your pharmacy. >> Mar 24, 2024 11:38 AM Clydene Darner H wrote: Patient daughter Susen Epstein called this morning regarding rx refill message from 03/19/2024. She stated that she haven't heard anything back regarding rx.   Callback Number: [phone]

## 2024-03-25 MED ORDER — TRIAMCINOLONE ACETONIDE 0.1 % EX OINT
1.0000 | TOPICAL_OINTMENT | Freq: Two times a day (BID) | CUTANEOUS | 1 refills | Status: AC
Start: 2024-03-25 — End: ?

## 2024-03-25 NOTE — Addendum Note (Signed)
 Addended by: Lianny Molter G on: 03/25/2024 01:32 PM   Modules accepted: Orders

## 2024-04-03 ENCOUNTER — Ambulatory Visit (INDEPENDENT_AMBULATORY_CARE_PROVIDER_SITE_OTHER): Admitting: Family

## 2024-04-03 ENCOUNTER — Encounter: Payer: Self-pay | Admitting: Family

## 2024-04-03 VITALS — BP 128/64 | HR 71 | Temp 97.5°F | Ht 63.0 in | Wt 125.8 lb

## 2024-04-03 DIAGNOSIS — J449 Chronic obstructive pulmonary disease, unspecified: Secondary | ICD-10-CM | POA: Diagnosis not present

## 2024-04-03 DIAGNOSIS — D509 Iron deficiency anemia, unspecified: Secondary | ICD-10-CM | POA: Diagnosis not present

## 2024-04-03 DIAGNOSIS — B0230 Zoster ocular disease, unspecified: Secondary | ICD-10-CM

## 2024-04-03 DIAGNOSIS — E785 Hyperlipidemia, unspecified: Secondary | ICD-10-CM

## 2024-04-03 DIAGNOSIS — R0602 Shortness of breath: Secondary | ICD-10-CM | POA: Diagnosis not present

## 2024-04-03 DIAGNOSIS — J45909 Unspecified asthma, uncomplicated: Secondary | ICD-10-CM | POA: Diagnosis not present

## 2024-04-03 MED ORDER — FLUTICASONE-SALMETEROL 250-50 MCG/ACT IN AEPB: INHALATION_SPRAY | RESPIRATORY_TRACT | 1 refills | Status: DC

## 2024-04-03 MED ORDER — FLUTICASONE PROPIONATE 50 MCG/ACT NA SUSP: 2.0000 | Freq: Every day | NASAL | 6 refills | Status: AC

## 2024-04-03 MED ORDER — ATORVASTATIN CALCIUM 10 MG PO TABS: 10.0000 mg | ORAL_TABLET | Freq: Every day | ORAL | 3 refills | Status: DC

## 2024-04-03 MED ORDER — NORTRIPTYLINE HCL 10 MG PO CAPS: 10.0000 mg | ORAL_CAPSULE | Freq: Every day | ORAL | 2 refills | Status: AC

## 2024-04-03 NOTE — Patient Instructions (Signed)
 Start Lyrica  as not effective and sedating. Please start nortriptyline  10 mg at bedtime for postherpetic pain from shingles.    Please let me know of any new lesions or blisters

## 2024-04-03 NOTE — Progress Notes (Signed)
 Assessment & Plan:  Herpes zoster with ophthalmic complication, unspecified herpes zoster eye disease Assessment & Plan: Chronic, suboptimal control of pain.  No new lesions on exam.  Discussed more likely postherpetic neuralgia.  Lyrica  25 mg not particularly helpful, and at higher doses caused drowsiness. Trial of nortriptyline  25mg  at bedtime and suspend lyrica .  Collaborated with cardiology, Dr Debra Familia via secure chat.  He was also okay with starting nortriptyline .    IDA (iron  deficiency anemia) -     VITAMIN D 25 Hydroxy (Vit-D Deficiency, Fractures); Future -     Hemoglobin A1c; Future -     CBC with Differential/Platelet; Future -     Comprehensive metabolic panel with GFR; Future -     Lipid panel; Future -     Atorvastatin  Calcium ; Take 1 tablet (10 mg total) by mouth daily.  Dispense: 90 tablet; Refill: 3  Mild asthma without complication, unspecified whether persistent -     Fluticasone -Salmeterol; Inhale 1 inhalation by mouth  into the lungs in the morning and @ bedtime  Dispense: 200 each; Refill: 1  Hyperlipidemia, unspecified hyperlipidemia type -     Lipid panel; Future -     TSH; Future  Chronic obstructive pulmonary disease, unspecified COPD type (HCC)  SOB (shortness of breath) Assessment & Plan:  Never smoker  Refilled advair today  Advised f/u with Dr Darnelle Elders   Other orders -     Fluticasone  Propionate; Place 2 sprays into both nostrils daily.  Dispense: 16 g; Refill: 6 -     Nortriptyline  HCl; Take 1 capsule (10 mg total) by mouth at bedtime.  Dispense: 30 capsule; Refill: 2     Return precautions given.   Risks, benefits, and alternatives of the medications and treatment plan prescribed today were discussed, and patient expressed understanding.   Education regarding symptom management and diagnosis given to patient on AVS either electronically or printed.  Return in about 1 week (around 04/10/2024) for Fasting labs in 2-3 weeks.  Bascom Bossier,  FNP  Subjective:    Patient ID: Daisy Holloway, female    DOB: 06/17/1930, 89 y.o.   MRN: [id number]  CC: Daisy Holloway is a 89 y.o. female who presents today for an acute visit.    HPI: Accompanied by daughter  She complains left sided of face , itching and burning which has been chronic.  No new skin lesions or blisters  Sun and heat aggravate pain.   She is compliant with lyrica  25mg  every day, higher doses cause drowsiness. She is not taking tramadol       No ear lesions.   She uses erythromycin  op ointment prn.   She had seen Dr Washington  05/22/23.    Follow-up cardiology 03/21/2024 for shortness of breath. EKG SR with occasional PVCs. QTc 409.    Patient declined Myoview.  Mild to moderate aortic stenosis.  Repeat echocardiogram in 1 year History of shingles first diagnosed 06/08/2023 left side of face  Initially treated with valacyclovir    Never smoker  Initially seen by Dr Jamal Mays 06/2015 COPD early stage II Consult with Dr Darnelle Elders, 01/30/23 whom didn't suspect COPD.   Allergies: Sulfa antibiotics and Pantoprazole  sodium Current Outpatient Medications on File Prior to Visit  Medication Sig Dispense Refill   ferrous sulfate 325 (65 FE) MG EC tablet Take 325 mg by mouth every other day.     albuterol  (VENTOLIN  HFA) 108 (90 Base) MCG/ACT inhaler USE 2 INHALATIONS BY MOUTH  INTO THE LUNGS 4 TIMES  DAILY AS NEEDED 34 g 3   aspirin EC 81 MG tablet Take 81 mg by mouth daily.     Cholecalciferol (VITAMIN D3) 2000 UNITS capsule Take 5,000 Units by mouth daily.      erythromycin  ophthalmic ointment Place a 1/2 inch ribbon of ointment into the lower eyelid. 3.5 g 0   furosemide  (LASIX ) 20 MG tablet Take 1 tablet (20 mg total) by mouth as needed. 90 tablet 0   ipratropium-albuterol  (DUONEB) 0.5-2.5 (3) MG/3ML SOLN Take 3 mLs by nebulization See admin instructions. 360 mL 8   Multiple Vitamin (MULTI-VITAMINS) TABS Take by mouth. Reported on 06/13/2016     omeprazole  (PRILOSEC) 20  MG capsule Take 1 capsule (20 mg total) by mouth 2 (two) times daily. 180 capsule 3   triamcinolone  ointment (KENALOG ) 0.1 % Apply 1 Application topically 2 (two) times daily. 30 g 1   vitamin B-12 (CYANOCOBALAMIN ) 1000 MCG tablet Take by mouth.     No current facility-administered medications on file prior to visit.    Review of Systems    Objective:    BP 128/64   Pulse 71   Temp (!) 97.5 F (36.4 C) (Oral)   Ht 5\' 3"  (1.6 m)   Wt 125 lb 12.8 oz (57.1 kg)   SpO2 97%   BMI 22.28 kg/m   BP Readings from Last 3 Encounters:  04/03/24 128/64  03/21/24 (!) 116/54  11/15/23 118/78   Wt Readings from Last 3 Encounters:  04/03/24 125 lb 12.8 oz (57.1 kg)  03/21/24 125 lb 3.2 oz (56.8 kg)  11/15/23 120 lb (54.4 kg)     Physical Exam Vitals reviewed.  Constitutional:      Appearance: She is well-developed.  Eyes:     Conjunctiva/sclera: Conjunctivae normal.  Cardiovascular:     Rate and Rhythm: Normal rate and regular rhythm.     Pulses: Normal pulses.     Heart sounds: Normal heart sounds.  Pulmonary:     Effort: Pulmonary effort is normal.     Breath sounds: Normal breath sounds. No wheezing, rhonchi or rales.  Skin:    General: Skin is warm and dry.     Comments: Left sided increased sensitivity generally over cheek and left forehead.  No vesicular lesions. Skin intact.   Neurological:     Mental Status: She is alert.  Psychiatric:        Speech: Speech normal.        Behavior: Behavior normal.        Thought Content: Thought content normal.

## 2024-04-03 NOTE — Assessment & Plan Note (Addendum)
  Never smoker  Refilled advair today  Advised f/u with Dr Darnelle Elders

## 2024-04-04 ENCOUNTER — Telehealth: Payer: Self-pay | Admitting: Family

## 2024-04-04 NOTE — Telephone Encounter (Signed)
 Spoke to pt daughter Buddie Carina and Informed her that Mom can take the Nortriptyline  10 mg and stop the Lyrica  25 mg, per Dr Debra Familia. She verbalized understanding

## 2024-04-04 NOTE — Telephone Encounter (Signed)
 Call pt and daughter   Consulted with cardiology, Dr Debra Familia  He is okay with started nortriptyline  10mg ; I sent in yesterday She may stop lyrica  25mg    Please ensure she follow up in a couple of weeks

## 2024-04-04 NOTE — Assessment & Plan Note (Addendum)
 Chronic, suboptimal control of pain.  No new lesions on exam.  Discussed more likely postherpetic neuralgia.  Lyrica  25 mg not particularly helpful, and at higher doses caused drowsiness. Trial of nortriptyline  25mg  at bedtime and suspend lyrica .  Collaborated with cardiology, Dr Debra Familia via secure chat.  He was also okay with starting nortriptyline .

## 2024-04-11 ENCOUNTER — Encounter (HOSPITAL_COMMUNITY): Payer: Self-pay

## 2024-04-18 ENCOUNTER — Ambulatory Visit: Admitting: Family

## 2024-04-20 DIAGNOSIS — R0600 Dyspnea, unspecified: Secondary | ICD-10-CM | POA: Diagnosis not present

## 2024-04-23 ENCOUNTER — Other Ambulatory Visit

## 2024-05-15 ENCOUNTER — Ambulatory Visit: Attending: Cardiology

## 2024-05-15 DIAGNOSIS — I350 Nonrheumatic aortic (valve) stenosis: Secondary | ICD-10-CM | POA: Diagnosis not present

## 2024-05-15 LAB — ECHOCARDIOGRAM COMPLETE
AR max vel: 0.81 cm2
AV Area VTI: 0.9 cm2
AV Area mean vel: 0.9 cm2
AV Mean grad: 28 mmHg
AV Peak grad: 61.8 mmHg
Ao pk vel: 3.93 m/s
Area-P 1/2: 3.53 cm2
S' Lateral: 2.17 cm

## 2024-05-16 ENCOUNTER — Ambulatory Visit: Payer: Self-pay | Admitting: Cardiology

## 2024-05-21 DIAGNOSIS — R0600 Dyspnea, unspecified: Secondary | ICD-10-CM | POA: Diagnosis not present

## 2024-05-28 ENCOUNTER — Ambulatory Visit: Admission: EM | Admit: 2024-05-28 | Discharge: 2024-05-28 | Attending: Family Medicine | Admitting: Family Medicine

## 2024-05-28 ENCOUNTER — Ambulatory Visit (INDEPENDENT_AMBULATORY_CARE_PROVIDER_SITE_OTHER)

## 2024-05-28 ENCOUNTER — Telehealth: Payer: Self-pay | Admitting: Cardiology

## 2024-05-28 DIAGNOSIS — R42 Dizziness and giddiness: Secondary | ICD-10-CM | POA: Insufficient documentation

## 2024-05-28 DIAGNOSIS — R062 Wheezing: Secondary | ICD-10-CM

## 2024-05-28 DIAGNOSIS — R110 Nausea: Secondary | ICD-10-CM | POA: Diagnosis not present

## 2024-05-28 DIAGNOSIS — R0789 Other chest pain: Secondary | ICD-10-CM | POA: Diagnosis not present

## 2024-05-28 DIAGNOSIS — R918 Other nonspecific abnormal finding of lung field: Secondary | ICD-10-CM | POA: Diagnosis not present

## 2024-05-28 DIAGNOSIS — J449 Chronic obstructive pulmonary disease, unspecified: Secondary | ICD-10-CM | POA: Diagnosis not present

## 2024-05-28 DIAGNOSIS — K449 Diaphragmatic hernia without obstruction or gangrene: Secondary | ICD-10-CM | POA: Diagnosis not present

## 2024-05-28 LAB — CBC WITH DIFFERENTIAL/PLATELET
Abs Immature Granulocytes: 0.02 10*3/uL (ref 0.00–0.07)
Basophils Absolute: 0 10*3/uL (ref 0.0–0.1)
Basophils Relative: 0 %
Eosinophils Absolute: 0.1 10*3/uL (ref 0.0–0.5)
Eosinophils Relative: 2 %
HCT: 47.3 % — ABNORMAL HIGH (ref 36.0–46.0)
Hemoglobin: 15.3 g/dL — ABNORMAL HIGH (ref 12.0–15.0)
Immature Granulocytes: 0 %
Lymphocytes Relative: 21 %
Lymphs Abs: 1.1 10*3/uL (ref 0.7–4.0)
MCH: 27.3 pg (ref 26.0–34.0)
MCHC: 32.3 g/dL (ref 30.0–36.0)
MCV: 84.3 fL (ref 80.0–100.0)
Monocytes Absolute: 0.4 10*3/uL (ref 0.1–1.0)
Monocytes Relative: 7 %
Neutro Abs: 3.8 10*3/uL (ref 1.7–7.7)
Neutrophils Relative %: 70 %
Platelets: 153 10*3/uL (ref 150–400)
RBC: 5.61 MIL/uL — ABNORMAL HIGH (ref 3.87–5.11)
RDW: 14.4 % (ref 11.5–15.5)
WBC: 5.4 10*3/uL (ref 4.0–10.5)
nRBC: 0 % (ref 0.0–0.2)

## 2024-05-28 LAB — COMPREHENSIVE METABOLIC PANEL WITH GFR
ALT: 17 U/L (ref 0–44)
AST: 23 U/L (ref 15–41)
Albumin: 4.2 g/dL (ref 3.5–5.0)
Alkaline Phosphatase: 89 U/L (ref 38–126)
Anion gap: 11 (ref 5–15)
BUN: 17 mg/dL (ref 8–23)
CO2: 27 mmol/L (ref 22–32)
Calcium: 9.5 mg/dL (ref 8.9–10.3)
Chloride: 97 mmol/L — ABNORMAL LOW (ref 98–111)
Creatinine, Ser: 0.79 mg/dL (ref 0.44–1.00)
GFR, Estimated: >60 mL/min (ref >60)
Glucose, Bld: 109 mg/dL — ABNORMAL HIGH (ref 70–99)
Potassium: 4.4 mmol/L (ref 3.5–5.1)
Sodium: 135 mmol/L (ref 135–145)
Total Bilirubin: 0.8 mg/dL (ref 0.0–1.2)
Total Protein: 7.4 g/dL (ref 6.5–8.1)

## 2024-05-28 LAB — GLUCOSE, CAPILLARY: Glucose-Capillary: 107 mg/dL — ABNORMAL HIGH (ref 70–99)

## 2024-05-28 NOTE — Telephone Encounter (Signed)
 Spoke with daughter per DPR. Patient was seen at urgent care today for chest pain, fatigue and nausea. Patient was recommended to go to ED by Urgent Care due to chest pain and elevated heart rate. Daughter states that patient did not want to go to the ED. Patient currently denies chest pain and has a heart rate of 83. Patient scheduled to be seen in clinic 05/29/24. Patient made aware of ED precaution should any new symptoms develop or worsen.

## 2024-05-28 NOTE — Telephone Encounter (Signed)
 Pt's daughter is requesting a callback regarding recent urgent care visit labs and EKG. Please advise.

## 2024-05-28 NOTE — ED Triage Notes (Signed)
 Dizziness, nausea, headache x 1 day. Pt states she had swelling in her hands and took her lasix  yesterday.

## 2024-05-28 NOTE — Telephone Encounter (Signed)
 Called patient and left message for call back.

## 2024-05-28 NOTE — ED Provider Notes (Signed)
 [facility]    CSN: [id number] Arrival date & time: 05/28/24  1146      History   Chief Complaint Chief Complaint  Patient presents with   Dizziness   Nausea   Headache    HPI Daisy Holloway is a 89 y.o. female.   HPI  History provided by patient and her daughter  Daisy Holloway presents for nausea, dizziness and headache for the past couple days. Headache went away yesterday.  Feels off. Had rhinorrhea which stopped after using the steroid nose spray.  Has COPD but the cough has gotten worse. Uses O2 at night but hasn't needed it in a couple days.   Took some Lasix  as she though her ankles were swollen. Has constipation but typically occurs every other day.    Tries to stay hydrated. She drank a little Body Armor today.   Daughter states her stomach has not been right since eating greasy fries. Has constipation but typically occurs every other day.  No vomiting or diarrhea.   Has history of Shingles but has been applying a salve for continued rash which has improved.     Past Medical History:  Diagnosis Date   Anemia    IDA   Arthritis    Asthma    GERD (gastroesophageal reflux disease)    Iron  deficiency anemia due to chronic blood loss 04/01/2015   Osteoporosis     Patient Active Problem List   Diagnosis Date Noted   Positive self-administered antigen test for COVID-19 07/11/2023   Anemia 06/25/2023   Aspirin sensitivity 06/25/2023   Asthma 06/25/2023   Colon polyp 06/25/2023   H. pylori infection 06/25/2023   Hyperlipidemia 06/25/2023   Multiple allergies 06/25/2023   Herpes zoster with ophthalmic complication 05/27/2023   Constipation 05/14/2023   SOB (shortness of breath) 02/07/2023   Chronic obstructive pulmonary disease (HCC) 09/28/2022   Anxiety 09/28/2022   Post-COVID syndrome resolved 06/23/2021   GERD (gastroesophageal reflux disease)    Annual physical exam 09/16/2020   Mitral valve disease 09/16/2020   Chronic bronchitis (HCC)  07/19/2020   Anxiety as acute reaction to exceptional stress 04/16/2020   Chronic fatigue 04/16/2020   Iron  deficiency anemia due to chronic blood loss 04/01/2015   Abdominal pain, chronic, epigastric 09/24/2014   Heme positive stool 09/24/2014    Past Surgical History:  Procedure Laterality Date   ABDOMINAL HYSTERECTOMY  1964   ovaries left in   BREAST BIOPSY Right 1998   patient states calcifications-benign   CATARACT EXTRACTION     one eye in 2007 and the other eye 2012   CHOLECYSTECTOMY     KNEE SURGERY Right 2004   KNEE SURGERY Left 2012   SHOULDER SURGERY  1995    OB History   No obstetric history on file.      Home Medications    Prior to Admission medications   Medication Sig Start Date End Date Taking? Authorizing Provider  aspirin EC 81 MG tablet Take 81 mg by mouth daily. Patient not taking: Reported on 05/29/2024   Yes [provider]  atorvastatin  (LIPITOR) 10 MG tablet Take 1 tablet (10 mg total) by mouth daily. 04/03/24  Yes Dineen Rollene MATSU, FNP  erythromycin  ophthalmic ointment Place a 1/2 inch ribbon of ointment into the lower eyelid. 06/08/23  Yes White, Adrienne R, NP  ferrous sulfate 325 (65 FE) MG EC tablet Take 325 mg by mouth every other day.   Yes [provider]  fluticasone  (FLONASE ) 50  MCG/ACT nasal spray Place 2 sprays into both nostrils daily. 04/03/24  Yes Dineen Rollene MATSU, FNP  fluticasone -salmeterol (ADVAIR) 250-50 MCG/ACT AEPB Inhale 1 inhalation by mouth  into the lungs in the morning and @ bedtime 04/03/24  Yes [name], Rollene MATSU, FNP  furosemide  (LASIX ) 20 MG tablet Take 1 tablet (20 mg total) by mouth as needed. 02/06/24  Yes Agbor-Etang, Redell, MD  ipratropium-albuterol  (DUONEB) 0.5-2.5 (3) MG/3ML SOLN Take 3 mLs by nebulization See admin instructions. 12/13/22  Yes Lowen Mansouri, DO  Multiple Vitamin (MULTI-VITAMINS) TABS Take by mouth. Reported on 06/13/2016   Yes [provider]  omeprazole  (PRILOSEC) 20 MG  capsule Take 1 capsule (20 mg total) by mouth 2 (two) times daily. 01/03/24  Yes Dineen Rollene MATSU, FNP  vitamin B-12 (CYANOCOBALAMIN ) 1000 MCG tablet Take by mouth.   Yes [provider]  albuterol  (VENTOLIN  HFA) 108 (90 Base) MCG/ACT inhaler USE 2 INHALATIONS BY MOUTH  INTO THE LUNGS 4 TIMES  DAILY AS NEEDED 03/26/23   Dineen Rollene MATSU, FNP  Cholecalciferol (VITAMIN D3) 2000 UNITS capsule Take 5,000 Units by mouth daily.     [provider]  nortriptyline  (PAMELOR ) 10 MG capsule Take 1 capsule (10 mg total) by mouth at bedtime. 04/03/24   Dineen Rollene MATSU, FNP  triamcinolone  ointment (KENALOG ) 0.1 % Apply 1 Application topically 2 (two) times daily. 03/25/24   Dineen Rollene MATSU, FNP    Family History Family History  Problem Relation Age of Onset   Liver cancer Mother    Arthritis Mother    Gastric cancer Father    Asthma Father    Diabetes Sister    Diabetes Brother    Breast cancer Maternal Aunt    Breast cancer Cousin     Social History Social History   Tobacco Use   Smoking status: Never    Passive exposure: Past   Smokeless tobacco: Never  Vaping Use   Vaping status: Never Used  Substance Use Topics   Alcohol use: Yes    Comment: occ. wine   Drug use: No     Allergies   Sulfa antibiotics and Pantoprazole  sodium   Review of Systems Review of Systems: negative unless otherwise stated in HPI.      Physical Exam Triage Vital Signs ED Triage Vitals [05/28/24 1153]  Encounter Vitals Group     BP      Girls Systolic BP Percentile      Girls Diastolic BP Percentile      Boys Systolic BP Percentile      Boys Diastolic BP Percentile      Pulse Rate (!) 102     Resp 18     Temp      Temp src      SpO2      Weight      Height      Head Circumference      Peak Flow      Pain Score      Pain Loc      Pain Education      Exclude from Growth Chart    No data found.  Updated Vital Signs Pulse (!) 102   Resp 18   SpO2 90%   Visual  Acuity Right Eye Distance:   Left Eye Distance:   Bilateral Distance:    Right Eye Near:   Left Eye Near:    Bilateral Near:     Physical Exam GEN:     alert, elderly female and  no distress    HENT:  mucus membranes moist, oropharyngeal without lesions or erythema,  nares patent, no nasal discharge  EYES:   pupils equal and reactive, EOM intact NECK:  supple, normal ROM, no lymphadenopathy  RESP:  Scattered expiratory wheezing bilaterally, no increased work of breathing  CVS:   regular rhythm, tachycardic, distal pulses intact   ABD:  soft, non-tender; bowel sounds present; no palpable masses,  EXT:   normal ROM, atraumatic, no LE edema  NEURO:  normal without focal findings,  speech normal, alert and oriented at her baseline   Skin:   warm and dry, facial rash present (post shingles)    UC Treatments / Results  Labs (all labs ordered are listed, but only abnormal results are displayed) Labs Reviewed  GLUCOSE, CAPILLARY - Abnormal; Notable for the following components:      Result Value   Glucose-Capillary 107 (*)    All other components within normal limits  COMPREHENSIVE METABOLIC PANEL WITH GFR - Abnormal; Notable for the following components:   Chloride 97 (*)    Glucose, Bld 109 (*)    All other components within normal limits  CBC WITH DIFFERENTIAL/PLATELET - Abnormal; Notable for the following components:   RBC 5.61 (*)    Hemoglobin 15.3 (*)    HCT 47.3 (*)    All other components within normal limits  CBG MONITORING, ED    EKG  If EKG performed, see my interpretation in the MDM section  Radiology DG Chest 2 View Result Date: 05/28/2024 CLINICAL DATA:  Wheezing.  History of COPD. EXAM: CHEST - 2 VIEW COMPARISON:  12/13/2022. FINDINGS: The heart size and mediastinal contours are unchanged. Stable moderate-to-large hiatal hernia. Stable emphysematous changes with hyperinflation. No focal consolidation, pleural effusion, or pneumothorax. No acute osseous  abnormality. IMPRESSION: 1. No acute cardiopulmonary findings. 2. Stable emphysematous changes. 3. Stable moderate-to-large hiatal hernia. Electronically Signed   By: Harrietta Sherry M.D.   On: 05/28/2024 13:24     Procedures Procedures (including critical care time) Laying 138/81  99  Sitting 121/98 98  Standing 114/77 101   Medications Ordered in UC Medications - No data to display  Initial Impression / Assessment and Plan / UC Course  I have reviewed the triage vital signs and the nursing notes.  Pertinent labs & imaging results that were available during my care of the patient were reviewed by me and considered in my medical decision making (see chart for details).       Patient is a 89 y.o. female  who presents for  feeling off with complaints of dizziness, nausea, elevated heart rates and headache. She is tachycardic, normotensive, afebrile while satting 90% on room air. She uses oxygent at home occasionally.  Declined oxygen  and breathing treatment here. On exam, she has scattered expiratory wheezing.   EKG personally interpreted by me; NSR with LVH and 1st degree AV block, compared to 03/21/24 the AV block is new.  Recommended ED evaluation but daughter and patient did not want to go. They requested I do whatever I can here. Explained limitations of the urgent care with heart workup and head imaging and they accept the risk at this time.    CBC obtained showing mild hemoconcentration. CMP is unremarkable. Chest xray personally reviewed by me without focal pneumonia, pleural effusion, cardiomegaly or pneumothorax.  Radilogist impression reviewed and notes stable emphtysematous changes.    Given her abnormal EKG intermittent chest discomfort I again recommended ED evaluation and daughter will  take her to [facility] ED for further evaluation.     ED and return precautions given and patient  voiced understanding. Discussed MDM, treatment plan and plan for follow-up with patient who  agrees with plan.    Final Clinical Impressions(s) / UC Diagnoses   Final diagnoses:  Wheezing  Nausea  Dizziness  Chest tightness     Discharge Instructions      Your chest xray did not show any fluid or penumonia. Your EKG showed a fast heart rate and some elongation in one of your intervals. Your lab work showed evidence of some mild dehydration.   You have been advised to follow up immediately in the emergency department for concerning signs or symptoms as discussed during your visit. If you declined EMS transport, please have a family member take you directly to the ED at this time. Do not delay.   Based on concerns about condition, if you do not follow up in the ED, you may risk poor outcomes including worsening of condition, delayed treatment and potentially life threatening issues. If you have declined to go to the ED at this time, you should call your PCP immediately to set up a follow up appointment.      ED Prescriptions   None    PDMP not reviewed this encounter.   Zaniel Marineau, DO 05/30/24 1901

## 2024-05-28 NOTE — Discharge Instructions (Addendum)
 Your chest xray did not show any fluid or penumonia. Your EKG showed a fast heart rate and some elongation in one of your intervals. Your lab work showed evidence of some mild dehydration.   You have been advised to follow up immediately in the emergency department for concerning signs or symptoms as discussed during your visit. If you declined EMS transport, please have a family member take you directly to the ED at this time. Do not delay.   Based on concerns about condition, if you do not follow up in the ED, you may risk poor outcomes including worsening of condition, delayed treatment and potentially life threatening issues. If you have declined to go to the ED at this time, you should call your PCP immediately to set up a follow up appointment.

## 2024-05-29 ENCOUNTER — Ambulatory Visit: Attending: Cardiology | Admitting: Physician Assistant

## 2024-05-29 ENCOUNTER — Ambulatory Visit

## 2024-05-29 ENCOUNTER — Encounter: Payer: Self-pay | Admitting: Cardiology

## 2024-05-29 VITALS — BP 100/60 | HR 78 | Ht 64.0 in | Wt 124.8 lb

## 2024-05-29 DIAGNOSIS — R0602 Shortness of breath: Secondary | ICD-10-CM

## 2024-05-29 DIAGNOSIS — I350 Nonrheumatic aortic (valve) stenosis: Secondary | ICD-10-CM | POA: Diagnosis not present

## 2024-05-29 DIAGNOSIS — R002 Palpitations: Secondary | ICD-10-CM

## 2024-05-29 NOTE — Patient Instructions (Signed)
 Medication Instructions:  Your physician recommends that you continue on your current medications as directed. Please refer to the Current Medication list given to you today.   *If you need a refill on your cardiac medications before your next appointment, please call your pharmacy*  Lab Work: No labs ordered today  If you have labs (blood work) drawn today and your tests are completely normal, you will receive your results only by: MyChart Message (if you have MyChart) OR A paper copy in the mail If you have any lab test that is abnormal or we need to change your treatment, we will call you to review the results.  Testing/Procedures: Daisy Holloway- Long Term Monitor Instructions  Your physician has requested you wear a ZIO patch monitor for 14 days.  This is a single patch monitor. [employer] supplies one patch monitor per enrollment. Additional stickers are not available. Please do not apply patch if you will be having a Nuclear Stress Test,  Echocardiogram, Cardiac CT, MRI, or Chest Xray during the period you would be wearing the  monitor. The patch cannot be worn during these tests. You cannot remove and re-apply the  ZIO XT patch monitor.  Your ZIO patch monitor will be mailed 3 day USPS to your address on file. It may take 3-5 days  to receive your monitor after you have been enrolled.  Once you have received your monitor, please review the enclosed instructions. Your monitor  has already been registered assigning a specific monitor serial # to you.  Billing and Patient Assistance Program Information  We have supplied [employer] with any of your insurance information on file for billing purposes. [employer] offers a sliding scale Patient Assistance Program for patients that do not have  insurance, or whose insurance does not completely cover the cost of the ZIO monitor.  You must apply for the Patient Assistance Program to qualify for this discounted rate.  To apply, please call [employer] at  [phone], select option 4, select option 2, ask to apply for  Patient Assistance Program. Daisy Holloway will ask your household income, and how many people  are in your household. They will quote your out-of-pocket cost based on that information.  [employer] will also be able to set up a 28-month, interest-free payment plan if needed.  Applying the monitor   Shave hair from upper left chest.  Hold abrader disc by orange tab. Rub abrader in 40 strokes over the upper left chest as  indicated in your monitor instructions.  Clean area with 4 enclosed alcohol pads. Let dry.  Apply patch as indicated in monitor instructions. Patch will be placed under collarbone on left  side of chest with arrow pointing upward.  Rub patch adhesive wings for 2 minutes. Remove white label marked 1. Remove the white  label marked 2. Rub patch adhesive wings for 2 additional minutes.  While looking in a mirror, press and release button in center of patch. A small green light will  flash 3-4 times. This will be your only indicator that the monitor has been turned on.  Do not shower for the first 24 hours. You may shower after the first 24 hours.  Press the button if you feel a symptom. You will hear a small click. Record Date, Time and  Symptom in the Patient Logbook.  When you are ready to remove the patch, follow instructions on the last 2 pages of Patient  Logbook. Stick patch monitor onto the last page of Patient Logbook.  Place  Patient Logbook in the blue and white box. Use locking tab on box and tape box closed  securely. The blue and white box has prepaid postage on it. Please place it in the mailbox as  soon as possible. Your physician should have your test results approximately 7 days after the  monitor has been mailed back to [facility].  Call [facility] Customer Care at [phone] if you have questions regarding  your ZIO XT patch monitor. Call them immediately if you see an orange light  blinking on your  monitor.  If your monitor falls off in less than 4 days, contact our Monitor department at [phone].  If your monitor becomes loose or falls off after 4 days call [employer] at [phone] for  suggestions on securing your monitor   Follow-Up: At [facility], you and your health needs are our priority.  As part of our continuing mission to provide you with exceptional heart care, our providers are all part of one team.  This team includes your primary Cardiologist (physician) and Advanced Practice Providers or APPs (Physician Assistants and Nurse Practitioners) who all work together to provide you with the care you need, when you need it.  Your next appointment:   5 week(s) post Zio  Provider:   Redell Cave, MD or Lesley Maffucci, PA-C    We recommend signing up for the patient portal called MyChart.  Sign up information is provided on this After Visit Summary.  MyChart is used to connect with patients for Virtual Visits (Telemedicine).  Patients are able to view lab/test results, encounter notes, upcoming appointments, etc.  Non-urgent messages can be sent to your provider as well.   To learn more about what you can do with MyChart, go to [email or web address].

## 2024-05-29 NOTE — Progress Notes (Signed)
 Cardiology Office Note    Date:  05/29/2024   ID:  Daisy, Holloway Aug 01, 1930, MRN [id number]  PCP:  Dineen Rollene MATSU, FNP  Cardiologist:  Redell Cave, MD  Electrophysiologist:  None   Chief Complaint: UC follow up  History of Present Illness:   Daisy Holloway is a 89 y.o. 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    Patient presented to [facility] 06/2022 with symptoms of shortness of breath.  Workup with echo revealed normal EF, moderate aortic stenosis, and grade 1 diastolic dysfunction.  She was placed on home oxygen  which improved symptoms.  She was referred to and established with Dr. Cave 02/2023 with ongoing symptoms of dyspnea and orthopnea.  She was started on Lasix .  Echo 03/2023 showed EF 50 to 55%, G1 DD, mild to moderate aortic stenosis, and mild MR.  Lexiscan Myoview test was ordered but patient canceled and stated that she did not wish to have this test.   Patient was most recently seen by Dr. Cave 03/2023 and reported ongoing shortness of breath with exertion which was chronic.  She was recommended to get a annual echo to assess aortic stenosis. Echo completed 05/15/2024 showed EF 60-65%, G2DD, and moderate to severe AS with mean gradient 28 mmHc and Vmax 3.93 m/s. Recommendation for repeat echo in 6-12 months.   Patient reports to the office today for follow-up after visiting urgent care yesterday.  She was not feeling well for the past couple days.  She tried taking Lasix  2 days ago which did not improve symptoms.  She reports not sleeping well the night prior.  When she woke up yesterday morning, she did not feel good.  She notes having a headache and feeling nauseous.   She reports using her pulse oximeter which showed that her heart rate was in the low 100s bpm.  This was concerning to her.  She also felt that her  heart was beating abnormally.  Given the symptoms, she decided to report to urgent care.  Unfortunately, the ED provider note is incomplete at this time.  Heart rate was elevated at 102 bpm with otherwise normal vital signs.  Chest x-ray without acute findings.  CMP and CBC largely unremarkable.  According to the patient it was recommended that she report to the emergency department for further evaluation.  However, patient did not feel that this was necessary and schedule follow-up with our office instead.  Today, she reports overall feeling better.  Her headache and nausea have resolved.  She still reports feeling off but is unable to further describe this.  Her heart rate is improved today and was in the 80s when she checked it this morning, which was reassuring to her.  She has dyspnea on exertion which is unchanged from baseline.  She denies chest pain, lower extremity swelling, orthopnea, lightheadedness, dizziness, orthopnea, and PND.  She denies bleeding and hematochezia.  Labs independently reviewed: 05/28/2024-Hgb 15.3, HCT 47.3, platelets 153, sodium 135, potassium 4.4, BUN 17, creatinine 0.79, normal LFTs 02/14/2023-TC 171, TG 69, HDL 90, LDL 66  Objective   Past Medical History:  Diagnosis Date   Anemia    IDA   Arthritis    Asthma    GERD (gastroesophageal reflux disease)    Iron  deficiency anemia due to chronic blood loss 04/01/2015   Osteoporosis     Current  Medications: Current Meds  Medication Sig   albuterol  (VENTOLIN  HFA) 108 (90 Base) MCG/ACT inhaler USE 2 INHALATIONS BY MOUTH  INTO THE LUNGS 4 TIMES  DAILY AS NEEDED   atorvastatin  (LIPITOR) 10 MG tablet Take 1 tablet (10 mg total) by mouth daily.   Cholecalciferol (VITAMIN D3) 2000 UNITS capsule Take 5,000 Units by mouth daily.    erythromycin  ophthalmic ointment Place a 1/2 inch ribbon of ointment into the lower eyelid.   ferrous sulfate 325 (65 FE) MG EC tablet Take 325 mg by mouth every other day.   fluticasone   (FLONASE ) 50 MCG/ACT nasal spray Place 2 sprays into both nostrils daily.   fluticasone -salmeterol (ADVAIR) 250-50 MCG/ACT AEPB Inhale 1 inhalation by mouth  into the lungs in the morning and @ bedtime   furosemide  (LASIX ) 20 MG tablet Take 1 tablet (20 mg total) by mouth as needed.   ipratropium-albuterol  (DUONEB) 0.5-2.5 (3) MG/3ML SOLN Take 3 mLs by nebulization See admin instructions.   Multiple Vitamin (MULTI-VITAMINS) TABS Take by mouth. Reported on 06/13/2016   nortriptyline  (PAMELOR ) 10 MG capsule Take 1 capsule (10 mg total) by mouth at bedtime.   omeprazole  (PRILOSEC) 20 MG capsule Take 1 capsule (20 mg total) by mouth 2 (two) times daily.   triamcinolone  ointment (KENALOG ) 0.1 % Apply 1 Application topically 2 (two) times daily.   vitamin B-12 (CYANOCOBALAMIN ) 1000 MCG tablet Take by mouth.    Allergies:   Sulfa antibiotics and Pantoprazole  sodium   Social History   Socioeconomic History   Marital status: Married    Spouse name: Not on file   Number of children: Not on file   Years of education: Not on file   Highest education level: Not on file  Occupational History   Not on file  Tobacco Use   Smoking status: Never    Passive exposure: Past   Smokeless tobacco: Never  Vaping Use   Vaping status: Never Used  Substance and Sexual Activity   Alcohol use: Yes    Comment: occ. wine   Drug use: No   Sexual activity: Not on file  Other Topics Concern   Not on file  Social History Narrative   Not on file   Social Drivers of Health   Financial Resource Strain: Low Risk  (03/08/2023)   Overall Financial Resource Strain (CARDIA)    Difficulty of Paying Living Expenses: Not very hard  Food Insecurity: No Food Insecurity (06/12/2023)   Hunger Vital Sign    Worried About Running Out of Food in the Last Year: Never true    Ran Out of Food in the Last Year: Never true  Transportation Needs: No Transportation Needs (06/12/2023)   PRAPARE - Scientist, research (physical sciences) (Medical): No    Lack of Transportation (Non-Medical): No  Physical Activity: Unknown (03/08/2023)   Exercise Vital Sign    Days of Exercise per Week: 0 days    Minutes of Exercise per Session: Not on file  Stress: No Stress Concern Present (03/08/2023)   [employer] of Occupational Health - Occupational Stress Questionnaire    Feeling of Stress : Not at all  Social Connections: Moderately Isolated (03/08/2023)   Social Connection and Isolation Panel    Frequency of Communication with Friends and Family: More than three times a week    Frequency of Social Gatherings with Friends and Family: More than three times a week    Attends Religious Services: 1 to 4 times per year  Active Member of Clubs or Organizations: No    Attends Banker Meetings: Never    Marital Status: Widowed     Family History:  The patient's family history includes Arthritis in her mother; Asthma in her father; Breast cancer in her cousin and maternal aunt; Diabetes in her brother and sister; Gastric cancer in her father; Liver cancer in her mother.  ROS:   12-point review of systems is negative unless otherwise noted in the HPI.  EKGs/Other Studies Reviewed:    Studies reviewed were summarized above. The additional studies were reviewed today: 05/15/2024 echo complete 1. Left ventricular ejection fraction, by estimation, is 60 to 65%. The  left ventricle has normal function. The left ventricle has no regional  wall motion abnormalities. Left ventricular diastolic parameters are  consistent with Grade II diastolic  dysfunction (pseudonormalization).   2. Right ventricular systolic function is normal. The right ventricular  size is normal.   3. Left atrial size was moderately dilated.   4. The mitral valve is normal in structure. No evidence of mitral valve  regurgitation.   5. AoV DVI 0.3. The aortic valve is calcified. Aortic valve regurgitation  is mild. Moderate to severe  aortic valve stenosis. Aortic valve mean  gradient measures 28.0 mmHg. Aortic valve Vmax measures 3.93 m/s.   6. The inferior vena cava is normal in size with greater than 50%  respiratory variability, suggesting right atrial pressure of 3 mmHg.   03/20/2023 echo complete 1. Left ventricular ejection fraction, by estimation, is 50 to 55%. The  left ventricle has low normal function. The left ventricle has no regional  wall motion abnormalities. Left ventricular diastolic parameters are  consistent with Grade I diastolic  dysfunction (impaired relaxation).   2. Right ventricular systolic function is normal. The right ventricular  size is normal. There is normal pulmonary artery systolic pressure. The  estimated right ventricular systolic pressure is 27.7 mmHg.   3. Left atrial size was moderately dilated.   4. The mitral valve is normal in structure. Mild mitral valve  regurgitation. No evidence of mitral stenosis.   5. Tricuspid valve regurgitation is moderate.   6. The aortic valve has an indeterminant number of cusps. There is  moderate calcification of the aortic valve. Aortic valve regurgitation is  mild. Mild to moderate aortic valve stenosis. Aortic valve area, by VTI  measures 1.44 cm. Aortic valve mean  gradient measures 16.0 mmHg. Aortic valve Vmax measures 2.64 m/s.   7. The inferior vena cava is normal in size with greater than 50%  respiratory variability, suggesting right atrial pressure of 3 mmHg.   EKG:  EKG personally reviewed by me today EKG Interpretation Date/Time:  Thursday May 29 2024 14:39:07 EDT Ventricular Rate:  78 PR Interval:  190 QRS Duration:  94 QT Interval:  388 QTC Calculation: 442 R Axis:   -18  Text Interpretation: Sinus rhythm with Premature atrial complexes When compared with ECG of 28-May-2024 12:01, Premature atrial complexes are now Present PR interval has decreased Confirmed by Lorene Sinclair ([id number]) on 05/29/2024 2:58:37 PM  PHYSICAL EXAM:     VS:  BP 100/60 (BP Location: Right Arm, Patient Position: Sitting, Cuff Size: Normal)   Pulse 78   Ht 5' 4 (1.626 m)   Wt 124 lb 12.8 oz (56.6 kg)   SpO2 98%   BMI 21.42 kg/m   BMI: Body mass index is 21.42 kg/m.  Physical Exam Vitals and nursing note reviewed.  Constitutional:  General: She is not in acute distress.    Appearance: Normal appearance.   Cardiovascular:     Rate and Rhythm: Normal rate and regular rhythm.     Heart sounds: Murmur (III/VI systolic murmur) heard.  Pulmonary:     Effort: Pulmonary effort is normal. No respiratory distress.     Breath sounds: No wheezing or rales.   Musculoskeletal:     Right lower leg: No edema.     Left lower leg: No edema.   Skin:    General: Skin is warm and dry.   Neurological:     General: No focal deficit present.     Mental Status: She is alert and oriented to person, place, and time. Mental status is at baseline.   Psychiatric:        Mood and Affect: Mood normal.        Behavior: Behavior normal.     Wt Readings from Last 3 Encounters:  05/29/24 124 lb 12.8 oz (56.6 kg)  04/03/24 125 lb 12.8 oz (57.1 kg)  03/21/24 125 lb 3.2 oz (56.8 kg)        ASSESSMENT & PLAN:   Palpitations - Patient reported to urgent care yesterday for symptoms of palpitations, headache, and nausea.  Headache and nausea have both resolved.  EKG today shows frequent PACs.  Recommend 14-day ZIO XT for further evaluation of palpitations.  Shortness of breath - Symptoms are at baseline.  Echo 05/2024 showed EF 60 to 65%, G2 DD, and moderate aortic stenosis.  Patient continues to decline Myoview.  Suspect etiology is mainly COPD with some component of aortic stenosis.  Moderate aortic stenosis - Symptoms are stable.  Echo 05/2024 showed moderate aortic stenosis with mean gradient 28 mmHg and V-max 3.93 m/s.  Repeat echo in 6 to 12 months per Dr. Darliss.    Disposition: F/u with Dr. Darliss or an APP in 4 to 6  weeks/after ZIO monitoring.   Medication Adjustments/Labs and Tests Ordered: Current medicines are reviewed at length with the patient today.  Concerns regarding medicines are outlined above. Medication changes, Labs and Tests ordered today are summarized above and listed in the Patient Instructions accessible in Encounters.   Signed, Lesley Maffucci, PA-C 05/29/2024 4:36 PM     [facility] [place] [address] [place], [place] [address] [phone]

## 2024-06-20 ENCOUNTER — Other Ambulatory Visit: Payer: Self-pay | Admitting: Family

## 2024-06-20 DIAGNOSIS — R0600 Dyspnea, unspecified: Secondary | ICD-10-CM | POA: Diagnosis not present

## 2024-06-20 NOTE — Telephone Encounter (Signed)
 Copied from CRM [id number]. Topic: Clinical - Medication Refill >> Jun 20, 2024  1:32 PM Robinson H wrote: Medication: albuterol  (VENTOLIN  HFA) 108 (90 Base) MCG/ACT inhaler  Has the patient contacted their pharmacy? No, no refills (Agent: If no, request that the patient contact the pharmacy for the refill. If patient does not wish to contact the pharmacy document the reason why and proceed with request.) (Agent: If yes, when and what did the pharmacy advise?)  This is the patient's preferred pharmacy:    [facility] - [place], [place] - [address] [facility] [address] [place] [address] [place] [address] Phone: [phone] Fax: [phone]   Is this the correct pharmacy for this prescription? Yes If no, delete pharmacy and type the correct one.   Has the prescription been filled recently? No  Is the patient out of the medication? No  Has the patient been seen for an appointment in the last year OR does the patient have an upcoming appointment? Yes  Can we respond through MyChart? No  Agent: Please be advised that Rx refills may take up to 3 business days. We ask that you follow-up with your pharmacy.

## 2024-06-24 MED ORDER — ALBUTEROL SULFATE HFA 108 (90 BASE) MCG/ACT IN AERS: INHALATION_SPRAY | RESPIRATORY_TRACT | 3 refills | Status: DC

## 2024-06-25 ENCOUNTER — Other Ambulatory Visit: Payer: Self-pay

## 2024-06-25 MED ORDER — ALBUTEROL SULFATE HFA 108 (90 BASE) MCG/ACT IN AERS: INHALATION_SPRAY | RESPIRATORY_TRACT | 3 refills | Status: DC

## 2024-06-25 NOTE — Telephone Encounter (Signed)
 Received call stating pt needed Albuterol  sent to mail in order. Meds sent for pt.

## 2024-06-25 NOTE — Telephone Encounter (Unsigned)
 Copied from CRM [id number]. Topic: Clinical - Prescription Issue >> Jun 25, 2024  4:48 PM Shereese L wrote: Reason for CRM: patient daughter called in and stated that the medication albuterol  (VENTOLIN  HFA) 108 (90 Base) MCG/ACT inhaler Was sent to the wrong pharmacy and needs to be sent to [facility] [place], [place] - [address] [phone] [address] [place] [address]

## 2024-06-26 DIAGNOSIS — R002 Palpitations: Secondary | ICD-10-CM | POA: Diagnosis not present

## 2024-06-30 ENCOUNTER — Ambulatory Visit: Payer: Self-pay | Admitting: Physician Assistant

## 2024-06-30 DIAGNOSIS — R002 Palpitations: Secondary | ICD-10-CM

## 2024-07-15 ENCOUNTER — Encounter: Payer: Self-pay | Admitting: Cardiology

## 2024-07-15 ENCOUNTER — Ambulatory Visit: Attending: Cardiology | Admitting: Cardiology

## 2024-07-15 VITALS — BP 130/60 | HR 65 | Ht 64.0 in | Wt 126.6 lb

## 2024-07-15 DIAGNOSIS — I480 Paroxysmal atrial fibrillation: Secondary | ICD-10-CM | POA: Diagnosis not present

## 2024-07-15 DIAGNOSIS — I350 Nonrheumatic aortic (valve) stenosis: Secondary | ICD-10-CM | POA: Diagnosis not present

## 2024-07-15 DIAGNOSIS — R002 Palpitations: Secondary | ICD-10-CM | POA: Diagnosis not present

## 2024-07-15 DIAGNOSIS — R0602 Shortness of breath: Secondary | ICD-10-CM

## 2024-07-15 MED ORDER — BISOPROLOL FUMARATE 5 MG PO TABS: 2.5000 mg | ORAL_TABLET | Freq: Every day | ORAL | 3 refills | Status: AC

## 2024-07-15 MED ORDER — APIXABAN 2.5 MG PO TABS: 2.5000 mg | ORAL_TABLET | Freq: Two times a day (BID) | ORAL | 0 refills | Status: DC

## 2024-07-15 NOTE — Patient Instructions (Signed)
 Medication Instructions:  START Bisoprolol  2.5 mg daily at bedtime   TRY the Eliquis  2.5 mg twice daily (samples provided)   *If you need a refill on your cardiac medications before your next appointment, please call your pharmacy*  Testing/Procedures: Your physician has requested that you have an echocardiogram (DECEMBER). Echocardiography is a painless test that uses sound waves to create images of your heart. It provides your doctor with information about the size and shape of your heart and how well your heart's chambers and valves are working.   You may receive an ultrasound enhancing agent through an IV if needed to better visualize your heart during the echo. This procedure takes approximately one hour.  There are no restrictions for this procedure.  This will take place at [address] [facility]) [address], [place] [address]  Please note: We ask at that you not bring children with you during ultrasound (echo/ vascular) testing. Due to room size and safety concerns, children are not allowed in the ultrasound rooms during exams. Our front office staff cannot provide observation of children in our lobby area while testing is being conducted. An adult accompanying a patient to their appointment will only be allowed in the ultrasound room at the discretion of the ultrasound technician under special circumstances. We apologize for any inconvenience.   Follow-Up: At [facility], you and your health needs are our priority.  As part of our continuing mission to provide you with exceptional heart care, our providers are all part of one team.  This team includes your primary Cardiologist (physician) and Advanced Practice Providers or APPs (Physician Assistants and Nurse Practitioners) who all work together to provide you with the care you need, when you need it.  Your next appointment:   3-4 week(s)  Provider:   Redell Cave, MD or Tylene Lunch, NP    We  recommend signing up for the patient portal called MyChart.  Sign up information is provided on this After Visit Summary.  MyChart is used to connect with patients for Virtual Visits (Telemedicine).  Patients are able to view lab/test results, encounter notes, upcoming appointments, etc.  Non-urgent messages can be sent to your provider as well.   To learn more about what you can do with MyChart, go to [email or web address].

## 2024-07-15 NOTE — Progress Notes (Signed)
 Cardiology Office Note   Date:  07/15/2024  ID:  Daisy Holloway, DOB Apr 04, 1930, MRN [id number] PCP: Daisy Rollene MATSU, FNP  [facility] Providers Cardiologist:  Daisy Cave, MD     History of Present Illness Daisy Holloway is a 89 y.o. 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   She originally presented to [facility] in 06/2022 with symptoms of shortness of breath.  Workup with echo revealed normal EF, moderate aortic stenosis, and grade 1 diastolic dysfunction.  She was placed on home oxygen  which improved symptoms.  She was referred to establish with Dr. Cave 02/2023 with ongoing symptoms of dyspnea and orthopnea.  She was started on furosemide .  Echocardiogram April 2024 showed an LVEF of 50 to 25%, G1 DD, mild to moderate aortic stenosis, and mild MR.  Lexiscan Myoview review was ordered but patient canceled stating that she did not wish to have the test completed.  She was seen in clinic in April 2020 for reported ongoing shortness of breath with exertion that was chronic.  She was recommended to get annual echocardiograms to assess her aortic stenosis.  Echo was completed 05/15/2024 showing EF of 60 to 65%, G2 DD, moderate to severe AS with mean gradient 20 mmHg and a V-max of 3.53M/S recommendation was to repeat echo in 6 to 12 months.   She was last seen in clinic 05/29/2024 following up after recent visit to urgent care.  She not been feeling well for the past couple days.  She had not taken torsemide 2 days ago which did not improve her symptoms.  Heart rate was in the low 100s.  She was placed on a ZIO XT monitor for 14 days for palpitations and a repeat echocardiogram was ordered.  She returns to clinic today accompanied by her daughter. She continues to have palpitations, worsening fatigue, and shortness of breath.  She  states that she continues to have elevated heart rates up into the 120s while she is at home that she notices on her pulse oximeter.  She has been compliant with her current medications with any undue side effects.  She denies any recent hospitalizations or visits to the emergency department.  ROS: 10 point review of systems has been reviewed and considered negative with exception was been listed in the HPI  Studies Reviewed     Event Monitor (Zio) 06/27/2024 Conclusion Average heart rate 69, range 48-186. A-fib/atrial flutter noted, 6% burden. Several episodes of nonsustained SVT noted Occasional PACs 3.6% burden.   05/15/2024 echo complete 1. Left ventricular ejection fraction, by estimation, is 60 to 65%. The  left ventricle has normal function. The left ventricle has no regional  wall motion abnormalities. Left ventricular diastolic parameters are  consistent with Grade II diastolic  dysfunction (pseudonormalization).   2. Right ventricular systolic function is normal. The right ventricular  size is normal.   3. Left atrial size was moderately dilated.   4. The mitral valve is normal in structure. No evidence of mitral valve  regurgitation.   5. AoV DVI 0.3. The aortic valve is calcified. Aortic valve regurgitation  is mild. Moderate to severe aortic valve stenosis. Aortic valve mean  gradient measures 28.0 mmHg. Aortic valve Vmax measures 3.93 m/s.   6. The inferior vena cava is normal in size with greater than 50%  respiratory variability, suggesting right atrial pressure of 3  mmHg.    03/20/2023 echo complete 1. Left ventricular ejection fraction, by estimation, is 50 to 55%. The  left ventricle has low normal function. The left ventricle has no regional  wall motion abnormalities. Left ventricular diastolic parameters are  consistent with Grade I diastolic  dysfunction (impaired relaxation).   2. Right ventricular systolic function is normal. The right ventricular  size is  normal. There is normal pulmonary artery systolic pressure. The  estimated right ventricular systolic pressure is 27.7 mmHg.   3. Left atrial size was moderately dilated.   4. The mitral valve is normal in structure. Mild mitral valve  regurgitation. No evidence of mitral stenosis.   5. Tricuspid valve regurgitation is moderate.   6. The aortic valve has an indeterminant number of cusps. There is  moderate calcification of the aortic valve. Aortic valve regurgitation is  mild. Mild to moderate aortic valve stenosis. Aortic valve area, by VTI  measures 1.44 cm. Aortic valve mean  gradient measures 16.0 mmHg. Aortic valve Vmax measures 2.64 m/s.   7. The inferior vena cava is normal in size with greater than 50%  respiratory variability, suggesting right atrial pressure of 3 mmHg.  Risk Assessment/Calculations  CHA2DS2-VASc Score = 3   This indicates a 3.2% annual risk of stroke. The patient's score is based upon: CHF History: 0 HTN History: 0 Diabetes History: 0 Stroke History: 0 Vascular Disease History: 0 Age Score: 2 Gender Score: 1            Physical Exam VS:  BP 130/60 (BP Location: Left Arm, Patient Position: Sitting, Cuff Size: Normal)   Pulse 65   Ht 5' 4 (1.626 m)   Wt 126 lb 9.6 oz (57.4 kg)   SpO2 96%   BMI 21.73 kg/m        Wt Readings from Last 3 Encounters:  07/15/24 126 lb 9.6 oz (57.4 kg)  05/29/24 124 lb 12.8 oz (56.6 kg)  04/03/24 125 lb 12.8 oz (57.1 kg)    GEN: Well nourished, well developed in no acute distress NECK: No JVD; No carotid bruits CARDIAC: IR IR, no murmurs, rubs, gallops RESPIRATORY:  Clear to auscultation without rales, wheezing or rhonchi  ABDOMEN: Soft, non-tender, non-distended EXTREMITIES:  No edema; No deformity   ASSESSMENT AND PLAN New onset paroxysmal atrial fibrillation noted on ZIO XT monitoring.  Patient continues to feel palpitations and no shortness of breath.  She was started on 2.5 mg of bisoprolol  due to lung  issues and apixaban  2.5 mg twice daily for CHA2DS2-VASc score of at least 3 for stroke prophylaxis.  Aspirin was discontinued.  She is given Eliquis  samples today.  Moderate aortic stenosis with continued shortness of breath and palpitations.  Echocardiogram 05/2024 showed moderate aortic stenosis with mean gradient of 20 mmHg and a V-max of 3.20M/S.  She has been scheduled for an updated echocardiogram in 6 months in December.  Ongoing shortness of breath is slightly worse than baseline.  LVEF of 60 to 65%, G2 DD, moderate aortic stenosis.  She continues to decline Myoview.  Suspect etiology is mainly COPD and some component of aortic stenosis.  She has been encouraged to continue to monitor her oxygen  saturations with her pulse ox at home.  She does have nocturnal oxygen  that she uses.  She was never a smoker and pulmonary seems to think it is not COPD related she thinks that she requires oxygen  throughout the day.  She has been advised to follow-up with pulmonary or her  PCP to change orders for oxygen  as she can get DME from a company closer versus from [facility].       Dispo: Patient to return to clinic to see MD/APP in 3 to 4 weeks or sooner if needed for reevaluation.  Signed, Castiel Lauricella, NP

## 2024-07-16 ENCOUNTER — Telehealth: Payer: Self-pay | Admitting: Family

## 2024-07-16 NOTE — Telephone Encounter (Unsigned)
 Copied from CRM [id number]. Topic: General - Other >> Jul 16, 2024  1:23 PM Gennette ORN wrote: Reason for CRM: Patient's daughter Lucrecia is calling in because she rather get her oxygen  machine and equipment instead of traveling to [facility] for it.

## 2024-07-16 NOTE — Telephone Encounter (Unsigned)
 Copied from CRM [id number]. Topic: General - Other >> Jul 16, 2024  1:28 PM Gennette ORN wrote: Reason for CRM: Patient stated she has to have a follow up appointment with her primary doctor before next visit with heart doctor on Sept 4 ,2025 no appointments are open. Please follow up.

## 2024-07-16 NOTE — Telephone Encounter (Signed)
 Spoke to pt daughter Silvano scheduled appt for 07/28/24 for f/up before Heart dr appt and discuss getting Oxygen 

## 2024-07-16 NOTE — Telephone Encounter (Signed)
 Spoke to pt daughter Silvano scheduled appt for 07/28/24 for f/up

## 2024-07-21 DIAGNOSIS — R0600 Dyspnea, unspecified: Secondary | ICD-10-CM | POA: Diagnosis not present

## 2024-07-29 ENCOUNTER — Encounter: Payer: Self-pay | Admitting: Family

## 2024-07-29 ENCOUNTER — Ambulatory Visit (INDEPENDENT_AMBULATORY_CARE_PROVIDER_SITE_OTHER): Admitting: Family

## 2024-07-29 VITALS — BP 128/58 | HR 53 | Temp 98.0°F | Ht 63.0 in | Wt 126.0 lb

## 2024-07-29 DIAGNOSIS — I480 Paroxysmal atrial fibrillation: Secondary | ICD-10-CM

## 2024-07-29 DIAGNOSIS — R0602 Shortness of breath: Secondary | ICD-10-CM

## 2024-07-29 DIAGNOSIS — B0230 Zoster ocular disease, unspecified: Secondary | ICD-10-CM

## 2024-07-29 DIAGNOSIS — J449 Chronic obstructive pulmonary disease, unspecified: Secondary | ICD-10-CM

## 2024-07-29 DIAGNOSIS — I350 Nonrheumatic aortic (valve) stenosis: Secondary | ICD-10-CM

## 2024-07-29 NOTE — Progress Notes (Unsigned)
 Assessment & Plan:  There are no diagnoses linked to this encounter.   Return precautions given.   Risks, benefits, and alternatives of the medications and treatment plan prescribed today were discussed, and patient expressed understanding.   Education regarding symptom management and diagnosis given to patient on AVS either electronically or printed.  No follow-ups on file.  Rollene Northern, FNP  Subjective:    Patient ID: Daisy Holloway, female    DOB: November 09, 1930, 89 y.o.   MRN: [id number]  CC: Daisy Holloway is a 89 y.o. female who presents today for follow up.   HPI: HPI Compliant with advair She uses duoneb  SOB when walking. Albuterol  inhaler with relief Sa02 92  She takes lasix  20mg  every day prn  She is using oxygen  at night time Never smoker  Following with Cardiology 07/15/24  PAF, moderate AS  Repeat echocardiogram scheduled 11/17/24  Last seen Dr Theotis 06/15/2015 Impression: Mild-moderate obstruction on spirometry. Copd early stage II GERD MVP Fatique    CXR 05/28/24 No acute findings. Stable emphysematous changes. Stable moderate to large hiatal hernia     Allergies: Sulfa antibiotics and Pantoprazole  sodium Current Outpatient Medications on File Prior to Visit  Medication Sig Dispense Refill  . albuterol  (VENTOLIN  HFA) 108 (90 Base) MCG/ACT inhaler USE 2 INHALATIONS BY MOUTH  INTO THE LUNGS 4 TIMES  DAILY AS NEEDED 34 g 3  . apixaban  (ELIQUIS ) 2.5 MG TABS tablet Take 1 tablet (2.5 mg total) by mouth 2 (two) times daily. 42 tablet 0  . atorvastatin  (LIPITOR) 10 MG tablet Take 1 tablet (10 mg total) by mouth daily. 90 tablet 3  . bisoprolol  (ZEBETA ) 5 MG tablet Take 0.5 tablets (2.5 mg total) by mouth daily. 45 tablet 3  . Cholecalciferol (VITAMIN D3) 2000 UNITS capsule Take 5,000 Units by mouth daily.     . erythromycin  ophthalmic ointment Place a 1/2 inch ribbon of ointment into the lower eyelid. 3.5 g 0  . ferrous sulfate 325 (65 FE) MG EC  tablet Take 325 mg by mouth every other day.    . fluticasone  (FLONASE ) 50 MCG/ACT nasal spray Place 2 sprays into both nostrils daily. 16 g 6  . fluticasone -salmeterol (ADVAIR) 250-50 MCG/ACT AEPB Inhale 1 inhalation by mouth  into the lungs in the morning and @ bedtime 200 each 1  . furosemide  (LASIX ) 20 MG tablet Take 1 tablet (20 mg total) by mouth as needed. 90 tablet 0  . ipratropium-albuterol  (DUONEB) 0.5-2.5 (3) MG/3ML SOLN Take 3 mLs by nebulization See admin instructions. 360 mL 8  . Multiple Vitamin (MULTI-VITAMINS) TABS Take by mouth. Reported on 06/13/2016    . nortriptyline  (PAMELOR ) 10 MG capsule Take 1 capsule (10 mg total) by mouth at bedtime. 30 capsule 2  . omeprazole  (PRILOSEC) 20 MG capsule Take 1 capsule (20 mg total) by mouth 2 (two) times daily. 180 capsule 3  . triamcinolone  ointment (KENALOG ) 0.1 % Apply 1 Application topically 2 (two) times daily. 30 g 1  . vitamin B-12 (CYANOCOBALAMIN ) 1000 MCG tablet Take by mouth.     No current facility-administered medications on file prior to visit.    Review of Systems    Objective:    BP (!) 128/58   Pulse (!) 53   Temp 98 F (36.7 C) (Oral)   Ht 5' 3 (1.6 m)   Wt 126 lb (57.2 kg)   SpO2 96%   BMI 22.32 kg/m  BP Readings from Last 3 Encounters:  07/29/24 ROLLEN)  128/58  07/15/24 130/60  05/29/24 100/60   Wt Readings from Last 3 Encounters:  07/29/24 126 lb (57.2 kg)  07/15/24 126 lb 9.6 oz (57.4 kg)  05/29/24 124 lb 12.8 oz (56.6 kg)    Physical Exam Vitals reviewed.  Constitutional:      Appearance: She is well-developed.  Eyes:     Conjunctiva/sclera: Conjunctivae normal.  Cardiovascular:     Rate and Rhythm: Normal rate and regular rhythm.     Pulses: Normal pulses.     Heart sounds: Normal heart sounds.  Pulmonary:     Effort: Pulmonary effort is normal.     Breath sounds: Normal breath sounds. No wheezing, rhonchi or rales.  Musculoskeletal:     Right lower leg: No edema.     Left lower leg: No  edema.  Skin:    General: Skin is warm and dry.  Neurological:     Mental Status: She is alert.  Psychiatric:        Speech: Speech normal.        Behavior: Behavior normal.        Thought Content: Thought content normal.

## 2024-07-30 DIAGNOSIS — I350 Nonrheumatic aortic (valve) stenosis: Secondary | ICD-10-CM | POA: Insufficient documentation

## 2024-07-30 DIAGNOSIS — I480 Paroxysmal atrial fibrillation: Secondary | ICD-10-CM | POA: Insufficient documentation

## 2024-07-30 MED ORDER — ERYTHROMYCIN 5 MG/GM OP OINT
TOPICAL_OINTMENT | OPHTHALMIC | 0 refills | Status: AC
Start: 2024-07-30 — End: ?

## 2024-07-30 NOTE — Assessment & Plan Note (Addendum)
 Atrial fibrillation with anticoagulation and rate control   New atrial fibrillation with a 6% burden. Dx 07/2024. Discussed fatigue likely multifactorial however paroxysmal atrial fibrillation and on this protocol may be contributing factors.  Continue bisoprolol  2.5 mg daily and Eliquis  2.5 mg BID . Discussed the importance of monitor for side effects such as bleeding or increased fatigue. Follow up with cardiology on September 4th. Will defer to pulmonology regarding OSA evaluation in setting of PAF.

## 2024-07-30 NOTE — Assessment & Plan Note (Deleted)
 Reviewed pulmonology consult Dr. Theotis 2016.  Lost to follow-up.  Mild to moderate obstruction on spirometry, and his note stated COPD.  Discussed history of non-smoker.  Discussed environmental exposure. Walking SaO2 remains between 88-90% at the lowest. At rest sa02 96%.  Discussed

## 2024-07-30 NOTE — Patient Instructions (Signed)
 We will arrange follow-up with pulmonology.  I have prescribed continuous oxygen . I also refilled erythromycin  (antibiotic) ointment for your eye.

## 2024-07-30 NOTE — Assessment & Plan Note (Addendum)
  Persistent neuralgia following shingles with  soreness, numbness, itching, and burning.  Discussed unsure of how erythromycin  ointment would be effective.  She continues to use prn topical triamcinolone  on her left upper cheek.  Advised not to apply triamcinolone  in her eyes.  I did refill erythromycin  ointment per patient request.  If symptoms persist, will recommend follow-up with ophthalmology

## 2024-07-30 NOTE — Assessment & Plan Note (Signed)
 Discussed AS and need for surveillance as managed by cardiology. Repeat echocardiogram scheduled 11/2024.

## 2024-07-31 ENCOUNTER — Ambulatory Visit: Payer: Self-pay

## 2024-07-31 NOTE — Telephone Encounter (Signed)
 First attempt to contact pt, no answer, LVM for call back to [facility] pulm office for triage. Per pt chart, new patient pulm appt was scheduled by Harvell from pulm for 9/10. Placing in call back.    Copied from CRM [id number]. Topic: Clinical - Red Word Triage >> Jul 31, 2024  2:23 PM Lavanda D wrote: Red Word that prompted transfer to Nurse Triage: Shortness of Breath: Rashida with [facility] calling in regards to Daisy Holloway's referral for SOB. She said that the patient is currently experiencing these symptoms but she was not present with the patient at the time of call - she called to schedule the visit for the patient who was referred by her PCP.

## 2024-07-31 NOTE — Telephone Encounter (Signed)
 This RN attempted to contact patient for triage. No answer, voicemail left requesting return call to clinic.  3rd Attempt

## 2024-07-31 NOTE — Telephone Encounter (Signed)
 Noted  Did pt /daughter receieve order for continuous o2?  I prescribed with DME this week; please offer to fax to current 02 supplier.  She has follow up with pulmonology this month  If SOB where to worsen this weekend or certainly if she develop palpitations, CP  she needs to the ED right away.

## 2024-07-31 NOTE — Telephone Encounter (Signed)
 Spoke to pt daughter Silvano had her to reach out to Mom, she stated that Mom says she is ok she is not having any  sob at the moment the referral that is being placed is so the pt can get O2 at home because of new heart medication tends to make her sob.

## 2024-07-31 NOTE — Telephone Encounter (Signed)
 This RN attempted to contact patient for triage. No answer, voicemail left requesting return call to clinic. 2nd Attempt

## 2024-08-01 NOTE — Telephone Encounter (Signed)
 Please see telephone encounter from 07/31/24 spoke to daughter she has picked up O2 DME and is shopping around and also spoke to her regarding her Mom

## 2024-08-07 ENCOUNTER — Ambulatory Visit: Attending: Cardiology | Admitting: Cardiology

## 2024-08-07 ENCOUNTER — Encounter: Payer: Self-pay | Admitting: Cardiology

## 2024-08-07 ENCOUNTER — Telehealth: Payer: Self-pay | Admitting: Student in an Organized Health Care Education/Training Program

## 2024-08-07 VITALS — BP 122/54 | HR 57 | Ht 63.5 in | Wt 126.6 lb

## 2024-08-07 DIAGNOSIS — I480 Paroxysmal atrial fibrillation: Secondary | ICD-10-CM

## 2024-08-07 DIAGNOSIS — I350 Nonrheumatic aortic (valve) stenosis: Secondary | ICD-10-CM

## 2024-08-07 DIAGNOSIS — R0602 Shortness of breath: Secondary | ICD-10-CM | POA: Diagnosis not present

## 2024-08-07 DIAGNOSIS — R011 Cardiac murmur, unspecified: Secondary | ICD-10-CM | POA: Diagnosis not present

## 2024-08-07 DIAGNOSIS — R002 Palpitations: Secondary | ICD-10-CM

## 2024-08-07 MED ORDER — APIXABAN 2.5 MG PO TABS: 2.5000 mg | ORAL_TABLET | Freq: Two times a day (BID) | ORAL | 0 refills | Status: DC

## 2024-08-07 MED ORDER — APIXABAN 2.5 MG PO TABS: 2.5000 mg | ORAL_TABLET | Freq: Two times a day (BID) | ORAL | 3 refills | Status: DC

## 2024-08-07 NOTE — Progress Notes (Signed)
 Cardiology Office Note   Date:  08/07/2024  ID:  Daisy Holloway, DOB June 05, 1930, MRN [id number] PCP: Dineen Rollene MATSU, FNP  [facility] Providers Cardiologist:  Redell Cave, MD     History of Present Illness Daisy Holloway is a 89 y.o. 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   She originally presented to [facility] in 06/2022 with symptoms of shortness of breath.  Workup with echo revealed normal EF, moderate aortic stenosis, and grade 1 diastolic dysfunction.  She was placed on home oxygen  which improved symptoms.  She was referred to establish with Dr. Cave 02/2023 with ongoing symptoms of dyspnea and orthopnea.  She was started on furosemide .  Echocardiogram April 2024 showed an LVEF of 50 to 25%, G1 DD, mild to moderate aortic stenosis, and mild MR.  Lexiscan Myoview review was ordered but patient canceled stating that she did not wish to have the test completed.  She was seen in clinic in April 2020 for reported ongoing shortness of breath with exertion that was chronic.  She was recommended to get annual echocardiograms to assess her aortic stenosis.  Echo was completed 05/15/2024 showing EF of 60 to 65%, G2 DD, moderate to severe AS with mean gradient 20 mmHg and a V-max of 3.1M/S recommendation was to repeat echo in 6 to 12 months.  She was seen in clinic 05/29/2024 following up after recent visit to urgent care.  Heart rate was in the low 100s.  She was placed on ZIO XT monitor for palpitations and repeat echocardiogram was ordered.  Unfortunately she had also not been compliant with her torsemide.   She was last seen in clinic 07/15/2024 accompanied by her daughter.  She continued to have palpitations, worsening fatigue, shortness of breath.  States that she continued to have elevated heart rates about 120s while she was at  home and noticed on her pulse oximeter.  She has been compliant with her current medications with any undue side effects.  She was noted on 2.5 mg of bisoprolol  due to lung issues and continued on apixaban  2.5 mg twice daily for CHA2DS2-VASc of at least 3 for stroke prophylaxis.  Aspirin was discontinued.  She was given Eliquis  samples at her visit with a prescription sent in.    She returns to clinic today accompanied by her daughter.  She states that other than her chronic shortness of breath and fatigue her symptoms have unchanged.  Since being on bisoprolol  2.5 mg daily she states that her palpitations have improved.  She states that she has been compliant with her current medication regimen with any undue side effects.  Unfortunately she did run out of Eliquis  samples and has not been able to pick up blood thinner from the pharmacy as of yet.  She has noted a decrease in her heart rate at home back into the 50s and 60s.  Denies any bleeding with no blood noted in her stool or urine.  Denies any recent hospitalizations or visits to the emergency department.  ROS: 10 point review of systems has been reviewed and considered negative the exception was been listed in the HPI  Studies Reviewed EKG Interpretation Date/Time:  Thursday August 07 2024 14:07:11 EDT Ventricular Rate:  57 PR Interval:  184 QRS Duration:  72 QT Interval:  416 QTC Calculation: 404 R Axis:   15  Text Interpretation: Sinus  bradycardia Anterior infarct , age undetermined When compared with ECG of 29-May-2024 14:39, No acute changes Confirmed by Daisy Holloway ([id number]) on 08/07/2024 2:10:00 PM    Event Monitor (Zio) 06/27/2024 Conclusion Average heart rate 69, range 48-186. A-fib/atrial flutter noted, 6% burden. Several episodes of nonsustained SVT noted Occasional PACs 3.6% burden.    05/15/2024 echo complete 1. Left ventricular ejection fraction, by estimation, is 60 to 65%. The  left ventricle has normal function. The  left ventricle has no regional  wall motion abnormalities. Left ventricular diastolic parameters are  consistent with Grade II diastolic  dysfunction (pseudonormalization).   2. Right ventricular systolic function is normal. The right ventricular  size is normal.   3. Left atrial size was moderately dilated.   4. The mitral valve is normal in structure. No evidence of mitral valve  regurgitation.   5. AoV DVI 0.3. The aortic valve is calcified. Aortic valve regurgitation  is mild. Moderate to severe aortic valve stenosis. Aortic valve mean  gradient measures 28.0 mmHg. Aortic valve Vmax measures 3.93 m/s.   6. The inferior vena cava is normal in size with greater than 50%  respiratory variability, suggesting right atrial pressure of 3 mmHg.    03/20/2023 echo complete 1. Left ventricular ejection fraction, by estimation, is 50 to 55%. The  left ventricle has low normal function. The left ventricle has no regional  wall motion abnormalities. Left ventricular diastolic parameters are  consistent with Grade I diastolic  dysfunction (impaired relaxation).   2. Right ventricular systolic function is normal. The right ventricular  size is normal. There is normal pulmonary artery systolic pressure. The  estimated right ventricular systolic pressure is 27.7 mmHg.   3. Left atrial size was moderately dilated.   4. The mitral valve is normal in structure. Mild mitral valve  regurgitation. No evidence of mitral stenosis.   5. Tricuspid valve regurgitation is moderate.   6. The aortic valve has an indeterminant number of cusps. There is  moderate calcification of the aortic valve. Aortic valve regurgitation is  mild. Mild to moderate aortic valve stenosis. Aortic valve area, by VTI  measures 1.44 cm. Aortic valve mean  gradient measures 16.0 mmHg. Aortic valve Vmax measures 2.64 m/s.   7. The inferior vena cava is normal in size with greater than 50%  respiratory variability, suggesting right  atrial pressure of 3 mmHg.  Risk Assessment/Calculations  CHA2DS2-VASc Score = 3   This indicates a 3.2% annual risk of stroke. The patient's score is based upon: CHF History: 0 HTN History: 0 Diabetes History: 0 Stroke History: 0 Vascular Disease History: 0 Age Score: 2 Gender Score: 1            Physical Exam VS:  BP (!) 122/54   Pulse (!) 57   Ht 5' 3.5 (1.613 m)   Wt 126 lb 9.6 oz (57.4 kg)   SpO2 94%   BMI 22.07 kg/m        Wt Readings from Last 3 Encounters:  08/07/24 126 lb 9.6 oz (57.4 kg)  07/29/24 126 lb (57.2 kg)  07/15/24 126 lb 9.6 oz (57.4 kg)    GEN: Well nourished, well developed in no acute distress NECK: No JVD; No carotid bruits CARDIAC: RRR, bradycardic,  III/VI systolic murmur, without rubs or gallops RESPIRATORY:  Clear to auscultation without rales, wheezing or rhonchi  ABDOMEN: Soft, non-tender, non-distended EXTREMITIES:  No edema; No deformity   ASSESSMENT AND PLAN Paroxysmal atrial fibrillation noted on ZIO XT  monitoring.  Patient continues to have shortness of breath and fatigue but palpitations have improved.  EKG today reveals sinus bradycardia with rate of 57 with no ischemic changes noted.  She has continued on 2.5 of bisoprolol  and apixaban  2.5 mg twice daily for CHA2DS2-VASc score of at least 3 for stroke prophylaxis.  She is asking for apixaban  samples again today with a new prescription sent to [facility].  Moderate aortic stenosis with heart murmur noted on exam and continued shortness of breath.  Echocardiogram 05/2024 showed moderate aortic stenosis with a mean gradient of 20 mmHg and a V-max of 3.74m/s.  She has been scheduled for an updated echocardiogram at the end of October for reevaluation of the valve due to her continued shortness of breath.  Chronic shortness of breath/COPD which she states is slightly worse than baseline.  Last echocardiogram revealed 60-65%, G2 DD, moderate aortic stenosis.  She continues to decline any  ischemic workup such as Myoview.  Suspect etiology is likely a component of COPD and her aortic stenosis.  She has been encouraged to continue to monitor home oxygen  saturations with a pulse ox at home and to use her nocturnal oxygen  as needed during the day.  She was never smoker and at previous visits with pulmonary thinks is not related to her COPD but she has concerns that she was diagnosed with COPD back in 2016.  she had previously been seen by pulmonary and we are sending a referral for a different pulmonologist within the same group for her to follow-up with them per the patient's request.  She also recently had an oxygen  reordered from a company closer versus her having to get her DME from [facility].       Dispo: Patient to return to clinic to see MD/APP in 2 to 3 months or sooner if needed for reevaluation after repeat echocardiogram is completed.  Signed, Emeri Estill, NP

## 2024-08-07 NOTE — Telephone Encounter (Signed)
 Pt is asking for a TOC from Dgayli to Kasa. Will this be ok?

## 2024-08-07 NOTE — Patient Instructions (Signed)
 Medication Instructions:  Your physician recommends that you continue on your current medications as directed. Please refer to the Current Medication list given to you today.    Samples of Eliquis  given in office - 2 boxes  2.5 mg  *If you need a refill on your cardiac medications before your next appointment, please call your pharmacy*  Lab Work: No labs ordered today  If you have labs (blood work) drawn today and your tests are completely normal, you will receive your results only by: MyChart Message (if you have MyChart) OR A paper copy in the mail If you have any lab test that is abnormal or we need to change your treatment, we will call you to review the results.  Testing/Procedures: Move ECHO from December  Follow-Up: At [facility], you and your health needs are our priority.  As part of our continuing mission to provide you with exceptional heart care, our providers are all part of one team.  This team includes your primary Cardiologist (physician) and Advanced Practice Providers or APPs (Physician Assistants and Nurse Practitioners) who all work together to provide you with the care you need, when you need it.  Your next appointment:   3 month(s)  Provider:   You may see Redell Cave, MD or one of the following Advanced Practice Providers on your designated Care Team:   Lonni Meager, NP Lesley Maffucci, PA-C Bernardino Bring, PA-C Cadence Felicity, PA-C Tylene Lunch, NP Barnie Hila, NP    We recommend signing up for the patient portal called MyChart.  Sign up information is provided on this After Visit Summary.  MyChart is used to connect with patients for Virtual Visits (Telemedicine).  Patients are able to view lab/test results, encounter notes, upcoming appointments, etc.  Non-urgent messages can be sent to your provider as well.   To learn more about what you can do with MyChart, go to [email or web address].

## 2024-08-08 ENCOUNTER — Telehealth: Payer: Self-pay

## 2024-08-08 NOTE — Telephone Encounter (Signed)
 Copied from CRM #[id number]. Topic: Clinical - Order For Equipment >> Aug 08, 2024  1:30 PM Dedra B wrote: Reason for CRM: Pt daughter Silvano called to see if forms from [place] was received for patient's portable oxygen . Pls call daughter [phone].

## 2024-08-08 NOTE — Telephone Encounter (Signed)
 Spoke to pt daughter Silvano and informed her that we did receive the fax placed on provider desk for signature

## 2024-08-13 ENCOUNTER — Ambulatory Visit: Admitting: Student in an Organized Health Care Education/Training Program

## 2024-08-14 NOTE — Telephone Encounter (Signed)
 Pt scheduled with Kasa 10/23 at 1pm

## 2024-08-15 NOTE — Telephone Encounter (Unsigned)
 Copied from CRM #[id number]. Topic: Clinical - Order For Equipment >> Aug 14, 2024  5:12 PM Tysheama G wrote: Reason for CRM: Patient daughter Silvano is calling again regarding mom's oxygen . Advised her it may take 7-14 business days but she stated they need it asap. Callback [phone]

## 2024-08-18 ENCOUNTER — Telehealth: Payer: Self-pay | Admitting: Cardiology

## 2024-08-18 MED ORDER — APIXABAN 2.5 MG PO TABS: 2.5000 mg | ORAL_TABLET | Freq: Two times a day (BID) | ORAL | 1 refills | Status: DC

## 2024-08-18 NOTE — Telephone Encounter (Signed)
 *  STAT* If patient is at the pharmacy, call can be transferred to refill team.   1. Which medications need to be refilled? (please list name of each medication and dose if known)   apixaban  (ELIQUIS ) 2.5 MG TABS tablet   2. Which pharmacy/location (including street and city if local pharmacy) is medication to be sent to?  [id number] - [place], [place] - [address]    3. Do they need a 30 day or 90 day supply? 30 days  Patient states that [facility] told her they never received her prescription and now she only has 1 day left of medication. Can a 90 day script be sent again to [facility] for next month?

## 2024-08-18 NOTE — Telephone Encounter (Signed)
 Spoke to pt daughter Silvano she gave me a number to reach out to [place] in regards to Mom O2.[employer]: [phone]

## 2024-08-18 NOTE — Telephone Encounter (Signed)
 Prescription refill request for Eliquis  received. Indication: PAF Last office visit: 08/07/24  Tylene Lunch NP Scr: 0.79 on 05/28/24  Epic Age: 89 Weight: 57.4kg  Based on above findings Eliquis  2.5mg  twice daily is the appropriate dose.  Refill approved.

## 2024-08-20 ENCOUNTER — Other Ambulatory Visit: Payer: Self-pay | Admitting: Cardiology

## 2024-08-20 MED ORDER — APIXABAN 2.5 MG PO TABS: 2.5000 mg | ORAL_TABLET | Freq: Two times a day (BID) | ORAL | 0 refills | Status: AC

## 2024-08-25 ENCOUNTER — Other Ambulatory Visit: Payer: Self-pay

## 2024-08-25 NOTE — Telephone Encounter (Signed)
 Pt daughter called in and needs her Eliquis   sent to [facility].  They do not have a script.  She has 11 days left   Best number [phone]

## 2024-08-26 MED ORDER — APIXABAN 2.5 MG PO TABS: 2.5000 mg | ORAL_TABLET | Freq: Two times a day (BID) | ORAL | 1 refills | Status: DC

## 2024-08-26 NOTE — Telephone Encounter (Signed)
 RX routed to [facility]

## 2024-08-26 NOTE — Addendum Note (Signed)
 Addended by: ROBYNN OLAM HERO on: 08/26/2024 10:23 AM   Modules accepted: Orders

## 2024-08-26 NOTE — Telephone Encounter (Signed)
 DOB corrected in chart and Eliquis  Rx resent to [facility].

## 2024-08-26 NOTE — Telephone Encounter (Signed)
 Pt daughter called back stating [facility] informed her they received however her birthday in our system is incorrect so they cannot fill. Pt states her birthday 05/02/1929. Please advise.    1. Which medications need to be refilled? (please list name of each medication and dose if known)    apixaban  (ELIQUIS ) 2.5 MG TABS tablet   4. Which pharmacy/location (including street and city if local pharmacy) is medication to be sent to?  [facility] - [place], [place] - [address]    5. Do they need a 30 day or 90 day supply? 90

## 2024-08-29 NOTE — Telephone Encounter (Unsigned)
 Copied from CRM #[id number]. Topic: Clinical - Prescription Issue >> Aug 29, 2024 12:45 PM Alfonso ORN wrote: Reason for CRM: Johnston from [facility] called stating they are missing diagnoses on the SWO for rx for pt and is requesting it be faxed to them.   p: [phone] fax [phone]

## 2024-08-29 NOTE — Telephone Encounter (Unsigned)
 Copied from CRM #[id number]. Topic: Clinical - Order For Equipment >> Aug 14, 2024  5:12 PM Tysheama G wrote: Reason for CRM: Patient daughter Silvano is calling again regarding mom's oxygen . Advised her it may take 7-14 business days but she stated they need it asap. Callback [phone] >> Aug 29, 2024 12:47 PM Chiquita SQUIBB wrote: Patients daughter Silvano is calling in regarding the oxygen , she stated the oxygen  company states they need a diagnose for the patient. The company's fax number is [phone]. She stated this has been going on for almost a month and is getting frustrated.

## 2024-09-01 NOTE — Telephone Encounter (Signed)
 Called home health spoke with rep Ciara updated  her with the diagnosis which is  due to COPD and acute respiratory failure which i associated it with. R06.02 (ICD-10-CM) - SOB (shortness of breath) rep stated she is going to call the family. I called Holly to update her with this information and also asked her if she know of any other medical supplier she stated she know of one which is called [facility] daughter stated if she can't this this fixed she is going to try to switch over to that company

## 2024-09-03 NOTE — Telephone Encounter (Signed)
 Spoke with pt Daughter Silvano she still has not gotten the oxygen  yet but she was calling [employer] again today to see what is going on and will reach out to us  and let us  know

## 2024-09-04 NOTE — Telephone Encounter (Signed)
 noted

## 2024-09-04 NOTE — Telephone Encounter (Unsigned)
 Copied from CRM [id number]. Topic: General - Other >> Sep 04, 2024 12:28 PM Berneda FALCON wrote: Reason for CRM: Patient's daughter Lucrecia is calling back. She just missed a phone call from [place] and was returning her call.  Please call daughter back at the following number: [phone] [facility])

## 2024-09-04 NOTE — Telephone Encounter (Unsigned)
 Copied from CRM #[id number]. Topic: General - Call Back - No Documentation >> Sep 04, 2024 11:18 AM Rea BROCKS wrote: Reason for CRM: Patient's daughter, Lucrecia is calling back to speak with Jenate  due to oxygen  order for patient. She said she has the information that Jenate needs so patient can get oxygen .   Patient's daughter is asking if provider can place the order.   [phone] [facility])

## 2024-09-04 NOTE — Telephone Encounter (Signed)
 Spoke to pt daughter Silvano and she stated that they told her that the original company has to pick up their oxygen  before the other company can bring theirs in, she had no idea of this and it has been a mnth already she is going to check with [employer] and see exactly what they need and let us  know if there is anything else we need to do on our end

## 2024-09-04 NOTE — Telephone Encounter (Signed)
 LVM for pt daughter to call me back regarding Mom O2   Copied from CRM [id number]. Topic: General - Call Back - No Documentation >> Sep 04, 2024 11:18 AM Rea BROCKS wrote: Reason for CRM: Patient's daughter, Lucrecia is calling back to speak with Seeley Hissong  due to oxygen  order for patient. She said she has the information that Gerasimos Plotts needs so patient can get oxygen .    Patient's daughter is asking if provider can place the order.    [phone] [facility]

## 2024-09-05 NOTE — Telephone Encounter (Signed)
 Patient daughter called to request a copy of patient O2 prescription,the F2F and diagnostic code be sent to [facility] in order for them to provider patient O2.    CB#[phone]

## 2024-09-05 NOTE — Telephone Encounter (Signed)
 Spoke to Daisy Holloway pt daughter printed off F2F and DME order for her to pick up on Monday 09/08/24

## 2024-09-06 ENCOUNTER — Other Ambulatory Visit: Payer: Self-pay | Admitting: Family

## 2024-09-06 DIAGNOSIS — K219 Gastro-esophageal reflux disease without esophagitis: Secondary | ICD-10-CM

## 2024-09-06 DIAGNOSIS — J45909 Unspecified asthma, uncomplicated: Secondary | ICD-10-CM

## 2024-09-09 ENCOUNTER — Telehealth: Payer: Self-pay

## 2024-09-09 NOTE — Telephone Encounter (Signed)
 Order for oxygen  reprinted from 07/29/24 visit.  Last OV printed and placed with Daijah for Rollene to sign in the morning.

## 2024-09-09 NOTE — Telephone Encounter (Signed)
 Copied from CRM [id number]. Topic: Clinical - Request for Lab/Test Order >> Sep 09, 2024  3:06 PM Harlene ORN wrote: Reason for CRM: Patient needs a portable oxygen  carrier. Patient's daughter had all the paperwork done. The PCP has to fax paperwork to them. Please have Nurse Jenetta call her back to discuss.

## 2024-09-11 NOTE — Telephone Encounter (Signed)
 Forms faxed   Received confirmation

## 2024-09-16 ENCOUNTER — Telehealth: Payer: Self-pay

## 2024-09-16 ENCOUNTER — Other Ambulatory Visit: Payer: Self-pay | Admitting: Family

## 2024-09-16 DIAGNOSIS — J449 Chronic obstructive pulmonary disease, unspecified: Secondary | ICD-10-CM

## 2024-09-16 NOTE — Telephone Encounter (Unsigned)
 Copied from CRM [id number]. Topic: General - Other >> Sep 16, 2024  4:38 PM Sasha M wrote: Reason for CRM: pt daughter Silvano calling to provide fax number for [employer] [phone] [facility]

## 2024-09-16 NOTE — Telephone Encounter (Signed)
 Copied from CRM [id number]. Topic: Clinical - Request for Lab/Test Order >> Sep 16, 2024 11:20 AM [name] wrote: Daisy Holloway from [facility] is asking for an oxygen  renewal order. They also need an order for a conserving device for POC (specifically has to state that) to be faxed to [phone]

## 2024-09-17 NOTE — Telephone Encounter (Signed)
 Spoke to pt she gave me number I will keep trying to fax if not going through  I will reach out to [facility] [phone]

## 2024-09-17 NOTE — Telephone Encounter (Signed)
 LVM for Lachele @ [facility] [phone] to give me a call back to see if they have another fax number that I can fax DME order to the one I have [phone] I HAVE BEEN UNSUCESSFUL IN [facility]

## 2024-09-18 ENCOUNTER — Telehealth: Payer: Self-pay

## 2024-09-18 NOTE — Telephone Encounter (Signed)
 Copied from CRM #[id number]. Topic: General - Other >> Sep 18, 2024 11:24 AM Macario HERO wrote: Reason for CRM: Rosary from [facility] - Patient requesting a call back from [facility] [name]'s nurse regarding patient's prescription that needs clarity. Callback:  [phone]

## 2024-09-18 NOTE — Telephone Encounter (Signed)
 Spoke to Lachele @ [employer] [employer] [phone] she gave me a new fax number [phone] to fax new DME order to, faxed today and received confirmation that it was received

## 2024-09-18 NOTE — Telephone Encounter (Signed)
 Spoke to [employer] @ [employer] she emailed the proper form to  me that we need to fill out placed on pcp desk to complete and fax back

## 2024-09-19 ENCOUNTER — Encounter: Admitting: Internal Medicine

## 2024-09-19 NOTE — Telephone Encounter (Signed)
 LVM to call back but did resend fax to clarify beginning and end of oxygen     Laykell from [facility] - Patient requesting a call back from [facility] [name]'s nurse regarding patient's prescription that needs clarity. Callback:  [phone]   UPDATE: Rosary is calling back to speak with the clinical staff in regards to the pt's oxygen  refill. Laykell stated that its missing the dates for this refill and would like for someone to re fax the oxygen  refill with the dates included. Fax:[phone].

## 2024-09-19 NOTE — Telephone Encounter (Unsigned)
 Copied from CRM [id number]. Topic: Clinical - Medication Question >> Sep 19, 2024  8:19 AM Revonda D wrote: Reason for CRM: Daisy Holloway from [facility] - Patient requesting a call back from [facility] [name]'s nurse regarding patient's prescription that needs clarity. Callback:  [phone]  UPDATE: Daisy Holloway is calling back to speak with the clinical staff in regards to the pt's oxygen  refill. Daisy Holloway stated that its missing the dates for this refill and would like for someone to re fax the oxygen  refill with the dates included. [phone].

## 2024-09-19 NOTE — Progress Notes (Deleted)
 Subjective:  PATIENT ID:  GENDER: female DOB: 05/02/1929, MRN: [id number]  CC Follow up assessment of SOB    HPI Initially seen in January 2024 for cough Diagnosis of upper respiratory tract infection My assessment is she had COPD exacerbation  July 2024 at [facility] acute hypoxic respiratory failure and treated for pneumonia Another bout of COPD exacerbation  TTE from 06/2022 notable for aortic stenosis and HFpEF. medical record is notable for history of Afib.   She is a lifelong non-smoker and currently lives in [place].  She previously worked at [employer] with some exposure to formaldehyde.   Chest x-ray July 2025 reviewed in detail Findings are suggestive of flattened diaphragms with hyperinflated lungs I am concerned about significant hiatal hernia Stable moderate-to-large hiatal hernia.   Objective:   There were no vitals filed for this visit.    on RA  BMI Readings from Last 3 Encounters:  08/07/24 22.07 kg/m  07/29/24 22.32 kg/m  07/15/24 21.73 kg/m   Wt Readings from Last 3 Encounters:  08/07/24 126 lb 9.6 oz (57.4 kg)  07/29/24 126 lb (57.2 kg)  07/15/24 126 lb 9.6 oz (57.4 kg)    Physical Exam Constitutional:      General: She is not in acute distress.    Appearance: Normal appearance. She is not ill-appearing.  HENT:     Head: Normocephalic.     Nose: Nose normal.     Mouth/Throat:     Mouth: Mucous membranes are moist.  Cardiovascular:     Rate and Rhythm: Normal rate. Rhythm irregular.  Pulmonary:     Effort: Pulmonary effort is normal. No respiratory distress.     Breath sounds: Normal breath sounds. No wheezing or rales.  Abdominal:     Palpations: Abdomen is soft.  Musculoskeletal:        General: Normal range of motion.     Right lower leg: Edema present.     Left lower leg: Edema present.  Neurological:     General: No focal deficit present.     Mental Status: She is alert and oriented to person, place, and time. Mental status is at  baseline.       Ancillary Information    Past Medical History:  Diagnosis Date   Anemia    IDA   Arthritis    Asthma    GERD (gastroesophageal reflux disease)    Iron  deficiency anemia due to chronic blood loss 04/01/2015   Osteoporosis      Family History  Problem Relation Age of Onset   Liver cancer Mother    Arthritis Mother    Gastric cancer Father    Asthma Father    Diabetes Sister    Diabetes Brother    Breast cancer Maternal Aunt    Breast cancer Cousin      Past Surgical History:  Procedure Laterality Date   ABDOMINAL HYSTERECTOMY  1964   ovaries left in   BREAST BIOPSY Right 1998   patient states calcifications-benign   CATARACT EXTRACTION     one eye in 2007 and the other eye 2012   CHOLECYSTECTOMY     KNEE SURGERY Right 2004   KNEE SURGERY Left 2012   SHOULDER SURGERY  1995    Social History   Socioeconomic History   Marital status: Married    Spouse name: Not on file   Number of children: Not on file   Years of education: Not on file   Highest education level: Not  on file  Occupational History   Not on file  Tobacco Use   Smoking status: Never    Passive exposure: Past   Smokeless tobacco: Never  Vaping Use   Vaping status: Never Used  Substance and Sexual Activity   Alcohol use: Yes    Comment: occ. wine   Drug use: No   Sexual activity: Not on file  Other Topics Concern   Not on file  Social History Narrative   Not on file   Social Drivers of Health   Financial Resource Strain: Low Risk  (03/08/2023)   Overall Financial Resource Strain (CARDIA)    Difficulty of Paying Living Expenses: Not very hard  Food Insecurity: No Food Insecurity (06/12/2023)   Hunger Vital Sign    Worried About Running Out of Food in the Last Year: Never true    Ran Out of Food in the Last Year: Never true  Transportation Needs: No Transportation Needs (06/12/2023)   PRAPARE - Administrator, Civil Service (Medical): No    Lack of  Transportation (Non-Medical): No  Physical Activity: Unknown (03/08/2023)   Exercise Vital Sign    Days of Exercise per Week: 0 days    Minutes of Exercise per Session: Not on file  Stress: No Stress Concern Present (03/08/2023)   [employer] of Occupational Health - Occupational Stress Questionnaire    Feeling of Stress : Not at all  Social Connections: Moderately Isolated (03/08/2023)   Social Connection and Isolation Panel    Frequency of Communication with Friends and Family: More than three times a week    Frequency of Social Gatherings with Friends and Family: More than three times a week    Attends Religious Services: 1 to 4 times per year    Active Member of [employer] or Organizations: No    Attends Banker Meetings: Never    Marital Status: Widowed  Intimate Partner Violence: Not At Risk (03/08/2023)   Humiliation, Afraid, Rape, and Kick questionnaire    Fear of Current or Ex-Partner: No    Emotionally Abused: No    Physically Abused: No    Sexually Abused: No     Allergies  Allergen Reactions   Sulfa Antibiotics Hives    Other reaction(s): Unknown Other reaction(s): Unknown   Pantoprazole  Sodium     Causes abdominal pain     CBC    Component Value Date/Time   WBC 5.4 05/28/2024 1303   RBC 5.61 (H) 05/28/2024 1303   HGB 15.3 (H) 05/28/2024 1303   HGB 9.2 (L) 12/24/2014 0953   HCT 47.3 (H) 05/28/2024 1303   HCT 29.7 (L) 12/24/2014 0953   PLT 153 05/28/2024 1303   PLT 421 12/24/2014 0953   MCV 84.3 05/28/2024 1303   MCV 73 (L) 12/24/2014 0953   MCH 27.3 05/28/2024 1303   MCHC 32.3 05/28/2024 1303   RDW 14.4 05/28/2024 1303   RDW 18.9 (H) 12/24/2014 0953   LYMPHSABS 1.1 05/28/2024 1303   LYMPHSABS 1.8 12/24/2014 0953   MONOABS 0.4 05/28/2024 1303   MONOABS 0.6 12/24/2014 0953   EOSABS 0.1 05/28/2024 1303   EOSABS 0.1 12/24/2014 0953   BASOSABS 0.0 05/28/2024 1303   BASOSABS 0.0 12/24/2014 0953    Pulmonary Functions Testing Results:     No  data to display          Outpatient Medications Prior to Visit  Medication Sig Dispense Refill   albuterol  (VENTOLIN  HFA) 108 (90 Base) MCG/ACT inhaler USE  2 INHALATIONS BY MOUTH  INTO THE LUNGS 4 TIMES  DAILY AS NEEDED 34 g 3   apixaban  (ELIQUIS ) 2.5 MG TABS tablet Take 1 tablet (2.5 mg total) by mouth 2 (two) times daily for 14 days. 28 tablet 0   apixaban  (ELIQUIS ) 2.5 MG TABS tablet Take 1 tablet (2.5 mg total) by mouth 2 (two) times daily. 180 tablet 1   atorvastatin  (LIPITOR) 10 MG tablet Take 1 tablet (10 mg total) by mouth daily. 90 tablet 3   bisoprolol  (ZEBETA ) 5 MG tablet Take 0.5 tablets (2.5 mg total) by mouth daily. 45 tablet 3   Cholecalciferol (VITAMIN D3) 2000 UNITS capsule Take 5,000 Units by mouth daily.      erythromycin  ophthalmic ointment Place a 1/2 inch ribbon of ointment into the lower eyelid. 3.5 g 0   ferrous sulfate 325 (65 FE) MG EC tablet Take 325 mg by mouth every other day.     fluticasone  (FLONASE ) 50 MCG/ACT nasal spray Place 2 sprays into both nostrils daily. 16 g 6   fluticasone -salmeterol (ADVAIR) 250-50 MCG/ACT AEPB INHALE 1 INHALATION BY MOUTH  INTO THE LUNGS IN THE MORNING  AND AT BEDTIME 180 each 1   furosemide  (LASIX ) 20 MG tablet Take 1 tablet (20 mg total) by mouth as needed. 90 tablet 0   Multiple Vitamin (MULTI-VITAMINS) TABS Take by mouth. Reported on 06/13/2016     nortriptyline  (PAMELOR ) 10 MG capsule Take 1 capsule (10 mg total) by mouth at bedtime. 30 capsule 2   omeprazole  (PRILOSEC) 20 MG capsule TAKE 1 CAPSULE BY MOUTH TWICE  DAILY 200 capsule 1   triamcinolone  ointment (KENALOG ) 0.1 % Apply 1 Application topically 2 (two) times daily. 30 g 1   vitamin B-12 (CYANOCOBALAMIN ) 1000 MCG tablet Take by mouth.     No facility-administered medications prior to visit.      Assessment & Plan:   #Shortness of breath #Cough #HFpEF #Afib #Aortic Stenosis   She was reporting symptoms of cough productive of clear sputum after recent URTI  with some associated exertional dyspnea. This has mostly resolved, and she feels her symptoms are 8-9/10 improved. Her lung exam is clear and she has no wheezing, rales or rhonchi. While she might have had bronchitis, I don't suspect COPD given lack of smoking in her history. Lung parenchyma on the limited lung windows from previous abdominal CT's was normal. She has been using advair which she will continue.   Furthermore, the patient does have a report of atrial fibrillation in the medical record as well as an echocardiogram at [facility] from July 2023 showing moderate aortic stenosis, dilated left atrium, and signs of diastolic dysfunction concerning for heart failure with preserved ejection fraction.  She does have lower extremity edema on exam but no rales on auscultation of her lungs. She felt dizzy with ambulation which could be cardiac in origin.  Given all of this, the patient would benefit from a referral to cardiology for further evaluation as she does not have an established cardiologist and her primary care physician is retiring. She is scheduled to see Dr. Darliss this week.  End of visit medications:  No orders of the defined types were placed in this encounter.

## 2024-09-20 DIAGNOSIS — R0600 Dyspnea, unspecified: Secondary | ICD-10-CM | POA: Diagnosis not present

## 2024-09-22 ENCOUNTER — Ambulatory Visit: Attending: Cardiology

## 2024-09-22 DIAGNOSIS — I350 Nonrheumatic aortic (valve) stenosis: Secondary | ICD-10-CM

## 2024-09-22 LAB — ECHOCARDIOGRAM LIMITED
AV Mean grad: 24.5 mmHg
AV Peak grad: 44.8 mmHg
Ao pk vel: 3.35 m/s
Area-P 1/2: 4.21 cm2
S' Lateral: 2.54 cm

## 2024-09-22 NOTE — Telephone Encounter (Signed)
 Fax has been completed and sent back confirmed received

## 2024-09-25 ENCOUNTER — Ambulatory Visit: Payer: Self-pay | Admitting: Cardiology

## 2024-09-25 ENCOUNTER — Encounter: Admitting: Internal Medicine

## 2024-11-06 ENCOUNTER — Ambulatory Visit: Admitting: Cardiology

## 2024-11-07 ENCOUNTER — Other Ambulatory Visit: Payer: Self-pay | Admitting: Cardiology

## 2024-11-07 DIAGNOSIS — I480 Paroxysmal atrial fibrillation: Secondary | ICD-10-CM

## 2024-11-07 NOTE — Telephone Encounter (Signed)
 Eliquis  2.5mg  refill request received. Patient is 89 years old, weight-57.4kg, Crea-0.79 on 05/28/24, Diagnosis-Afib, and last seen by Tylene Lunch on 08/07/24. Dose is appropriate based on dosing criteria. Will send in refill to requested pharmacy.

## 2024-11-17 ENCOUNTER — Other Ambulatory Visit

## 2024-11-19 ENCOUNTER — Other Ambulatory Visit: Payer: Self-pay | Admitting: Family

## 2024-11-19 DIAGNOSIS — B028 Zoster with other complications: Secondary | ICD-10-CM

## 2024-11-19 NOTE — Telephone Encounter (Signed)
 Unable to pend nebulizer solution

## 2024-11-19 NOTE — Telephone Encounter (Unsigned)
 Copied from CRM [id number]. Topic: Clinical - Medication Refill >> Nov 19, 2024  2:37 PM Burnard DEL wrote: Medication: albuterol  sulfate for nebulizer  triamcinolone  ointment (KENALOG ) 0.1 %  Has the patient contacted their pharmacy? No (Agent: If no, request that the patient contact the pharmacy for the refill. If patient does not wish to contact the pharmacy document the reason why and proceed with request.) (Agent: If yes, when and what did the pharmacy advise?)  This is the patient's preferred pharmacy:  [id number] - [place], [place] - [address] [place] [address] Phone: [phone] Fax: [phone]   Is this the correct pharmacy for this prescription? No If no, delete pharmacy and type the correct one.   Has the prescription been filled recently? No  Is the patient out of the medication? Yes  Has the patient been seen for an appointment in the last year OR does the patient have an upcoming appointment? Yes  Can we respond through MyChart? No  Agent: Please be advised that Rx refills may take up to 3 business days. We ask that you follow-up with your pharmacy.

## 2024-11-21 ENCOUNTER — Other Ambulatory Visit: Payer: Self-pay | Admitting: Family

## 2024-11-21 DIAGNOSIS — B028 Zoster with other complications: Secondary | ICD-10-CM

## 2024-11-21 MED ORDER — TRIAMCINOLONE ACETONIDE 0.1 % EX OINT: 1.0000 | TOPICAL_OINTMENT | Freq: Two times a day (BID) | CUTANEOUS | 1 refills | Status: AC

## 2024-11-21 NOTE — Telephone Encounter (Signed)
 I refilled triamcinolone  however I do not see that she has a nebulizer solution.  Does she have a nebulizer machine ?  does she have the albuterol  inhaler?  Is that what she needs?

## 2024-11-22 ENCOUNTER — Other Ambulatory Visit: Payer: Self-pay | Admitting: Family

## 2024-11-22 DIAGNOSIS — J449 Chronic obstructive pulmonary disease, unspecified: Secondary | ICD-10-CM

## 2024-11-22 MED ORDER — LEVALBUTEROL HCL 0.63 MG/3ML IN NEBU: INHALATION_SOLUTION | RESPIRATORY_TRACT | 12 refills | Status: AC

## 2024-11-23 ENCOUNTER — Other Ambulatory Visit: Payer: Self-pay | Admitting: Family

## 2024-11-23 DIAGNOSIS — D509 Iron deficiency anemia, unspecified: Secondary | ICD-10-CM

## 2024-11-26 NOTE — Telephone Encounter (Signed)
 Spoke to pt she has picked up Xopenex 

## 2025-01-04 ENCOUNTER — Other Ambulatory Visit: Payer: Self-pay | Admitting: Family

## 2025-01-28 ENCOUNTER — Ambulatory Visit: Admitting: Cardiology
